# Patient Record
Sex: Male | Born: 1965 | Race: White | Hispanic: No | Marital: Married | Smoking: Never smoker
Health system: Southern US, Community
[De-identification: ages and names within clinical notes are randomized; demographics above are authoritative.]

## PROBLEM LIST (undated history)

## (undated) DIAGNOSIS — K219 Gastro-esophageal reflux disease without esophagitis: Secondary | ICD-10-CM

## (undated) DIAGNOSIS — K635 Polyp of colon: Secondary | ICD-10-CM

## (undated) DIAGNOSIS — K5792 Diverticulitis of intestine, part unspecified, without perforation or abscess without bleeding: Secondary | ICD-10-CM

## (undated) DIAGNOSIS — I10 Essential (primary) hypertension: Secondary | ICD-10-CM

## (undated) DIAGNOSIS — A6920 Lyme disease, unspecified: Secondary | ICD-10-CM

## (undated) DIAGNOSIS — R569 Unspecified convulsions: Secondary | ICD-10-CM

## (undated) DIAGNOSIS — G4730 Sleep apnea, unspecified: Secondary | ICD-10-CM

## (undated) DIAGNOSIS — R519 Headache, unspecified: Secondary | ICD-10-CM

## (undated) DIAGNOSIS — E785 Hyperlipidemia, unspecified: Secondary | ICD-10-CM

## (undated) DIAGNOSIS — M1990 Unspecified osteoarthritis, unspecified site: Secondary | ICD-10-CM

## (undated) DIAGNOSIS — T7840XA Allergy, unspecified, initial encounter: Secondary | ICD-10-CM

## (undated) DIAGNOSIS — N189 Chronic kidney disease, unspecified: Secondary | ICD-10-CM

## (undated) DIAGNOSIS — G709 Myoneural disorder, unspecified: Secondary | ICD-10-CM

## (undated) HISTORY — DX: Chronic kidney disease, unspecified: N18.9

## (undated) HISTORY — PX: NECK SURGERY: SHX720

## (undated) HISTORY — PX: PROSTATE BIOPSY: SHX241

## (undated) HISTORY — PX: SPINE SURGERY: SHX786

## (undated) HISTORY — DX: Hyperlipidemia, unspecified: E78.5

## (undated) HISTORY — DX: Polyp of colon: K63.5

## (undated) HISTORY — DX: Myoneural disorder, unspecified: G70.9

## (undated) HISTORY — DX: Diverticulitis of intestine, part unspecified, without perforation or abscess without bleeding: K57.92

## (undated) HISTORY — PX: HERNIA REPAIR: SHX51

## (undated) HISTORY — PX: SHOULDER SURGERY: SHX246

## (undated) HISTORY — DX: Essential (primary) hypertension: I10

## (undated) HISTORY — DX: Unspecified osteoarthritis, unspecified site: M19.90

## (undated) HISTORY — PX: CARDIAC CATHETERIZATION: SHX172

## (undated) HISTORY — PX: MENISCUS REPAIR: SHX5179

## (undated) HISTORY — DX: Allergy, unspecified, initial encounter: T78.40XA

---

## 2008-12-14 HISTORY — PX: NEPHRECTOMY: SHX65

## 2012-12-14 LAB — HM COLONOSCOPY

## 2017-04-14 ENCOUNTER — Encounter (INDEPENDENT_AMBULATORY_CARE_PROVIDER_SITE_OTHER): Payer: Self-pay

## 2017-04-14 ENCOUNTER — Encounter: Payer: Self-pay | Admitting: Primary Care

## 2017-04-14 ENCOUNTER — Ambulatory Visit (INDEPENDENT_AMBULATORY_CARE_PROVIDER_SITE_OTHER): Payer: Managed Care, Other (non HMO) | Admitting: Primary Care

## 2017-04-14 VITALS — BP 128/84 | HR 76 | Temp 98.0°F | Ht 69.5 in | Wt 251.5 lb

## 2017-04-14 DIAGNOSIS — E785 Hyperlipidemia, unspecified: Secondary | ICD-10-CM | POA: Diagnosis not present

## 2017-04-14 DIAGNOSIS — I10 Essential (primary) hypertension: Secondary | ICD-10-CM | POA: Diagnosis not present

## 2017-04-14 DIAGNOSIS — M2550 Pain in unspecified joint: Secondary | ICD-10-CM | POA: Diagnosis not present

## 2017-04-14 LAB — HEPATIC FUNCTION PANEL
ALBUMIN: 4.2 g/dL (ref 3.5–5.2)
ALT: 30 U/L (ref 0–53)
AST: 27 U/L (ref 0–37)
Alkaline Phosphatase: 44 U/L (ref 39–117)
Bilirubin, Direct: 0 mg/dL (ref 0.0–0.3)
Total Bilirubin: 0.4 mg/dL (ref 0.2–1.2)
Total Protein: 7.1 g/dL (ref 6.0–8.3)

## 2017-04-14 LAB — LIPID PANEL
CHOL/HDL RATIO: 7
CHOLESTEROL: 264 mg/dL — AB (ref 0–200)
HDL: 39.9 mg/dL (ref >39.00)
NonHDL: 224.22
TRIGLYCERIDES: 247 mg/dL — AB (ref 0.0–149.0)
VLDL: 49.4 mg/dL — AB (ref 0.0–40.0)

## 2017-04-14 LAB — LDL CHOLESTEROL, DIRECT: LDL DIRECT: 173 mg/dL

## 2017-04-14 MED ORDER — LISINOPRIL 10 MG PO TABS
10.0000 mg | ORAL_TABLET | Freq: Every day | ORAL | 1 refills | Status: DC
Start: 2017-04-14 — End: 2017-10-01

## 2017-04-14 NOTE — Progress Notes (Signed)
Subjective:    Patient ID: [name], male    DOB: 03-Aug-1966, 51 y.o.   MRN: [id number]  HPI  Mr. Blank is a 51 year old male who presents today to establish care and discuss the problems mentioned below. Will obtain old records. His last physical was Fall 2018.  1) Essential Hypertension: Currently managed on lisinopril 10 mg for which he's taken for the past 6 months. Previously his blood pressure was running 180/110.He denies chest pain, dizziness, shortness of breath.  2) History of Kidney Atrophy: Removal of left kidney in 2010. He is currently following with nephrology who ruled out renal cause for his hypertension 6 months ago. Recent renal function check was normal.   3) Lyme Disease: Diagnosed 6 months ago by a specialist who found positive markers for Lyme Disease.  Symptoms of joint aches, burning sensation to his skin for years. He is currently following with a specialist who treated him LDI and LDA in [place]. He stopped treatment two months ago as he moved to [place] and has noticed an increase in symptoms. He plans on finding a specialist who can provide him with these treatments locally.  4) Hyperlipidemia: Diagnosed in early January 2018. He is currently not managed on medication. He's not changed his diet or started exercising as he's been busy with moving. Previously managed on Simvastatin in 2008 which caused hives. He has labs on his phone which showed triglycerides over 500, total cholesterol at 308. He is fasting today.  Review of Systems  Constitutional: Negative for fatigue.  Eyes: Negative for visual disturbance.  Respiratory: Negative for shortness of breath.   Cardiovascular: Negative for chest pain.  Genitourinary: Negative for difficulty urinating.  Musculoskeletal: Positive for arthralgias and myalgias.  Skin: Negative for rash.  Neurological: Negative for dizziness and headaches.     Past Medical History:  Diagnosis Date  . Arthritis   .  Chronic kidney disease   . Hyperlipidemia   . Hypertension      Social History   Social History  . Marital status: Married    Spouse name: N/A  . Number of children: N/A  . Years of education: N/A   Occupational History  . Not on file.   Social History Main Topics  . Smoking status: Never Smoker  . Smokeless tobacco: Never Used  . Alcohol use Yes     Comment: occasional  . Drug use: No  . Sexual activity: Not on file   Other Topics Concern  . Not on file   Social History Narrative  . No narrative on file    Past Surgical History:  Procedure Laterality Date  . HERNIA REPAIR     x 2  . NECK SURGERY    . NEPHRECTOMY Left 2010  . SHOULDER SURGERY Left     Family History  Problem Relation Age of Onset  . Arthritis Mother   . Heart disease Mother   . Hypertension Mother   . Kidney disease Mother   . Colon cancer Father   . Lung cancer Father   . Prostate cancer Father   . Breast cancer Father   . Lung cancer Brother   . Lung cancer Paternal Aunt   . Colon cancer Paternal Uncle   . Diabetes Paternal Uncle   . Arthritis Maternal Grandmother   . Colon cancer Maternal Grandmother   . Heart disease Maternal Grandmother   . Heart disease Maternal Grandfather   . Diabetes Paternal Grandfather  Allergies  Allergen Reactions  . Nuvigil [Armodafinil] Other (See Comments)    Seizures   . Seroquel [Quetiapine] Other (See Comments)    Seizures.  . Simvastatin Hives    No current outpatient prescriptions on file prior to visit.   No current facility-administered medications on file prior to visit.     BP 128/84   Pulse 76   Temp 98 F (36.7 C) (Oral)   Ht 5' 9.5" (1.765 m)   Wt 251 lb 8 oz (114.1 kg)   BMI 36.61 kg/m    Objective:   Physical Exam  Constitutional: He is oriented to person, place, and time. He appears well-nourished.  Neck: Neck supple.  Cardiovascular: Normal rate and regular rhythm.   Pulmonary/Chest: Effort normal and breath  sounds normal. He has no wheezes. He has no rales.  Neurological: He is alert and oriented to person, place, and time.  Skin: Skin is warm and dry.  Psychiatric: He has a normal mood and affect.          Assessment & Plan:

## 2017-04-14 NOTE — Patient Instructions (Addendum)
Complete lab work prior to leaving today.   I sent refills for your lisinopril to [facility].  I'll be in touch regarding your lab results and the next step moving forward.  It was a pleasure to meet you today! Please don't hesitate to call me with any questions. Welcome to [facility]!

## 2017-04-14 NOTE — Assessment & Plan Note (Signed)
Chronic to knees, lower extremities, upper extremities. He is working to find a Quar[employer] manager to provide him with LDI and LDA treatment.

## 2017-04-14 NOTE — Assessment & Plan Note (Signed)
Evidence of hypertriglyceridemia and hypercholesterolemia from labs drawn in January 2018. We'll recheck lipids today. Consider Crestor, although he is reticent to take medications.   May require fenofibrate based off of triglyceride reading. He does have a prescription for fish oil at home but is unsure of the dose or name. Consider starting this as he never started taking as prescribed.  Strongly encouraged him to improve his diet and start exercising. Will await lab results.

## 2017-04-14 NOTE — Assessment & Plan Note (Signed)
Diagnosed and treated with lisinopril 10 mg 6 months ago. Blood pressure in the office today is stable. Will obtain records for recent BMP. Continue current regimen.

## 2017-06-18 ENCOUNTER — Encounter: Payer: Self-pay | Admitting: Family Medicine

## 2017-06-18 ENCOUNTER — Ambulatory Visit (INDEPENDENT_AMBULATORY_CARE_PROVIDER_SITE_OTHER): Payer: Managed Care, Other (non HMO) | Admitting: Family Medicine

## 2017-06-18 VITALS — BP 140/108 | HR 89 | Temp 97.6°F | Wt 259.6 lb

## 2017-06-18 DIAGNOSIS — R030 Elevated blood-pressure reading, without diagnosis of hypertension: Secondary | ICD-10-CM | POA: Diagnosis not present

## 2017-06-18 DIAGNOSIS — N3000 Acute cystitis without hematuria: Secondary | ICD-10-CM | POA: Diagnosis not present

## 2017-06-18 DIAGNOSIS — R5383 Other fatigue: Secondary | ICD-10-CM

## 2017-06-18 DIAGNOSIS — R1030 Lower abdominal pain, unspecified: Secondary | ICD-10-CM

## 2017-06-18 DIAGNOSIS — N402 Nodular prostate without lower urinary tract symptoms: Secondary | ICD-10-CM

## 2017-06-18 DIAGNOSIS — Z905 Acquired absence of kidney: Secondary | ICD-10-CM

## 2017-06-18 LAB — POCT URINALYSIS DIPSTICK
Bilirubin, UA: NEGATIVE
Glucose, UA: NEGATIVE
KETONES UA: NEGATIVE
Nitrite, UA: NEGATIVE
Protein, UA: NEGATIVE
RBC UA: NEGATIVE
SPEC GRAV UA: 1.025 (ref 1.010–1.025)
UROBILINOGEN UA: 0.2 U/dL
pH, UA: 6 (ref 5.0–8.0)

## 2017-06-18 LAB — COMPREHENSIVE METABOLIC PANEL
ALK PHOS: 45 U/L (ref 40–115)
ALT: 29 U/L (ref 9–46)
AST: 27 U/L (ref 10–35)
Albumin: 4.2 g/dL (ref 3.6–5.1)
BILIRUBIN TOTAL: 0.4 mg/dL (ref 0.2–1.2)
BUN: 10 mg/dL (ref 7–25)
CALCIUM: 9.5 mg/dL (ref 8.6–10.3)
CO2: 27 mmol/L (ref 20–31)
Chloride: 100 mmol/L (ref 98–110)
Creat: 1.01 mg/dL (ref 0.70–1.33)
GLUCOSE: 90 mg/dL (ref 65–99)
Potassium: 4.3 mmol/L (ref 3.5–5.3)
Sodium: 136 mmol/L (ref 135–146)
Total Protein: 6.9 g/dL (ref 6.1–8.1)

## 2017-06-18 LAB — CBC
HEMATOCRIT: 46.6 % (ref 38.5–50.0)
Hemoglobin: 16.2 g/dL (ref 13.2–17.1)
MCH: 31.8 pg (ref 27.0–33.0)
MCHC: 34.8 g/dL (ref 32.0–36.0)
MCV: 91.6 fL (ref 80.0–100.0)
MPV: 9.3 fL (ref 7.5–12.5)
Platelets: 232 10*3/uL (ref 140–400)
RBC: 5.09 MIL/uL (ref 4.20–5.80)
RDW: 13.5 % (ref 11.0–15.0)
WBC: 7.9 10*3/uL (ref 3.8–10.8)

## 2017-06-18 LAB — TSH: TSH: 1.45 mIU/L (ref 0.40–4.50)

## 2017-06-18 MED ORDER — CIPROFLOXACIN HCL 500 MG PO TABS: 250.0000 mg | ORAL_TABLET | Freq: Two times a day (BID) | ORAL | 0 refills | Status: DC

## 2017-06-18 NOTE — Progress Notes (Signed)
Subjective:    Patient ID: Evan [name], male    DOB: 23-Sep-1966, 51 y.o.   MRN: [id number]  HPI This is a 51 yo male who presents today with lower abdominal pain and sore/achy pain between rectum and testicles- has had longstanding, but worse lately (over last several days). Some shooting pains through his penis. Has been more fatigued lately. Never history of prostatitis. No fevers. Some loose stools, no constipation. Has seen urology in past and diagnosed with prostate nodule, according to patient, he had a negative biopsy and PSA has been normal. Appetite normal. Doesn't feel like he urinates as much as he should with as much as he drinks. Drinks about 1 gallon of water daily. Is a Freight forwarder at [employer]   Had left kidney removed 2010 due to atrophy. Had hernia repair after surgery.   Has had elevated blood pressure for several months and had nephrology evaluation.  Moved to [place] recently from [place].   Past Medical History:  Diagnosis Date  . Arthritis   . Chronic kidney disease   . Hyperlipidemia   . Hypertension    Past Surgical History:  Procedure Laterality Date  . HERNIA REPAIR     x 2  . NECK SURGERY    . NEPHRECTOMY Left 2010  . SHOULDER SURGERY Left    Family History  Problem Relation Age of Onset  . Arthritis Mother   . Heart disease Mother   . Hypertension Mother   . Kidney disease Mother   . Colon cancer Father   . Lung cancer Father   . Prostate cancer Father   . Breast cancer Father   . Lung cancer Brother   . Lung cancer Paternal Aunt   . Colon cancer Paternal Uncle   . Diabetes Paternal Uncle   . Arthritis Maternal Grandmother   . Colon cancer Maternal Grandmother   . Heart disease Maternal Grandmother   . Heart disease Maternal Grandfather   . Diabetes Paternal Grandfather    Social History  Substance Use Topics  . Smoking status: Never Smoker  . Smokeless tobacco: Never Used  . Alcohol use Yes     Comment: occasional       Review of Systems Per HPI    Objective:   Physical Exam  Constitutional: He is oriented to person, place, and time. He appears well-developed and well-nourished. No distress.  Obese.   HENT:  Head: Normocephalic and atraumatic.  Eyes: Conjunctivae are normal.  Cardiovascular: Normal rate, regular rhythm and normal heart sounds.   Pulmonary/Chest: Effort normal and breath sounds normal.  Abdominal: Soft. Bowel sounds are normal. There is tenderness (mild) in the suprapubic area. There is no rigidity, no rebound, no guarding, no tenderness at McBurney's point and negative Murphy's sign. No hernia.  Genitourinary: Prostate is not enlarged and not tender.  Neurological: He is alert and oriented to person, place, and time.  Skin: Skin is warm and dry. He is not diaphoretic.  Psychiatric: He has a normal mood and affect. His behavior is normal. Judgment and thought content normal.  Vitals reviewed.     BP (!) 140/100 (BP Location: Right Arm, Patient Position: Sitting, Cuff Size: Normal)   Pulse 89   Temp 97.6 F (36.4 C) (Oral)   Wt 259 lb 9.6 oz (117.8 kg)   SpO2 98%   BMI 37.79 kg/m  Wt Readings from Last 3 Encounters:  06/18/17 259 lb 9.6 oz (117.8 kg)  04/14/17 251 lb 8 oz (114.1 kg)  BP Readings from Last 3 Encounters:  06/18/17 (!) 140/100  04/14/17 128/84   Results for orders placed or performed in visit on 06/18/17  CBC  Result Value Ref Range   WBC 7.9 3.8 - 10.8 K/uL   RBC 5.09 4.20 - 5.80 MIL/uL   Hemoglobin 16.2 13.2 - 17.1 g/dL   HCT 46.6 38.5 - 50.0 %   MCV 91.6 80.0 - 100.0 fL   MCH 31.8 27.0 - 33.0 pg   MCHC 34.8 32.0 - 36.0 g/dL   RDW 13.5 11.0 - 15.0 %   Platelets 232 140 - 400 K/uL   MPV 9.3 7.5 - 12.5 fL  Comprehensive metabolic panel  Result Value Ref Range   Sodium 136 135 - 146 mmol/L   Potassium 4.3 3.5 - 5.3 mmol/L   Chloride 100 98 - 110 mmol/L   CO2 27 20 - 31 mmol/L   Glucose, Bld 90 65 - 99 mg/dL   BUN 10 7 - 25 mg/dL    Creat 1.01 0.70 - 1.33 mg/dL   Total Bilirubin 0.4 0.2 - 1.2 mg/dL   Alkaline Phosphatase 45 40 - 115 U/L   AST 27 10 - 35 U/L   ALT 29 9 - 46 U/L   Total Protein 6.9 6.1 - 8.1 g/dL   Albumin 4.2 3.6 - 5.1 g/dL   Calcium 9.5 8.6 - 10.3 mg/dL  TSH  Result Value Ref Range   TSH 1.45 0.40 - 4.50 mIU/L  PSA  Result Value Ref Range   PSA 0.3 <=4.0 ng/mL  Hemoglobin A1c  Result Value Ref Range   Hgb A1c MFr Bld 5.6 <5.7 %   Mean Plasma Glucose 114 mg/dL  POCT urinalysis dipstick  Result Value Ref Range   Color, UA Yellow    Clarity, UA Clear    Glucose, UA Negative    Bilirubin, UA Negative    Ketones, UA Negative    Spec Grav, UA 1.025 1.010 - 1.025   Blood, UA Negative    pH, UA 6.0 5.0 - 8.0   Protein, UA Negative    Urobilinogen, UA 0.2 0.2 or 1.0 E.U./dL   Nitrite, UA Negative    Leukocytes, UA Small (1+) (A) Negative       Assessment & Plan:  1. Lower abdominal pain - POCT urinalysis dipstick - CBC - Comprehensive metabolic panel  2. Acute cystitis without hematuria - will treat with flouroquinolone to cover early prostatitis. Discussed potential side effects of cipro and to stop medication and notify office if these occur.  - Urine Culture - ciprofloxacin (CIPRO) 500 MG tablet; Take 0.5 tablets (250 mg total) by mouth 2 (two) times daily.  Dispense: 10 tablet; Refill: 0 - Hemoglobin A1c  3. Other fatigue - could be related to heat and recent weight gain - TSH - Hemoglobin A1c  4. Prostate nodule - reports history and previous biopsy by urology, will check PSA today given symptoms.  - PSA  5. Elevated blood pressure - he will monitor at home and notify PCP is remains elevated  Clarene Reamer, FNP-BC  [facility]  06/19/2017 12:00 PM

## 2017-06-18 NOTE — Patient Instructions (Addendum)
I will notify you of lab results Take antibiotic until finished  Urinary Tract Infection, Adult A urinary tract infection (UTI) is an infection of any part of the urinary tract, which includes the kidneys, ureters, bladder, and urethra. These organs make, store, and get rid of urine in the body. UTI can be a bladder infection (cystitis) or kidney infection (pyelonephritis). What are the causes? This infection may be caused by fungi, viruses, or bacteria. Bacteria are the most common cause of UTIs. This condition can also be caused by repeated incomplete emptying of the bladder during urination. What increases the risk? This condition is more likely to develop if:  You ignore your need to urinate or hold urine for long periods of time.  You do not empty your bladder completely during urination.  You wipe back to front after urinating or having a bowel movement, if you are male.  You are uncircumcised, if you are male.  You are constipated.  You have a urinary catheter that stays in place (indwelling).  You have a weak defense (immune) system.  You have a medical condition that affects your bowels, kidneys, or bladder.  You have diabetes.  You take antibiotic medicines frequently or for long periods of time, and the antibiotics no longer work well against certain types of infections (antibiotic resistance).  You take medicines that irritate your urinary tract.  You are exposed to chemicals that irritate your urinary tract.  You are male.  What are the signs or symptoms? Symptoms of this condition include:  Fever.  Frequent urination or passing small amounts of urine frequently.  Needing to urinate urgently.  Pain or burning with urination.  Urine that smells bad or unusual.  Cloudy urine.  Pain in the lower abdomen or back.  Trouble urinating.  Blood in the urine.  Vomiting or being less hungry than normal.  Diarrhea or abdominal pain.  Vaginal  discharge, if you are male.  How is this diagnosed? This condition is diagnosed with a medical history and physical exam. You will also need to provide a urine sample to test your urine. Other tests may be done, including:  Blood tests.  Sexually transmitted disease (STD) testing.  If you have had more than one UTI, a cystoscopy or imaging studies may be done to determine the cause of the infections. How is this treated? Treatment for this condition often includes a combination of two or more of the following:  Antibiotic medicine.  Other medicines to treat less common causes of UTI.  Over-the-counter medicines to treat pain.  Drinking enough water to stay hydrated.  Follow these instructions at home:  Take over-the-counter and prescription medicines only as told by your health care provider.  If you were prescribed an antibiotic, take it as told by your health care provider. Do not stop taking the antibiotic even if you start to feel better.  Avoid alcohol, caffeine, tea, and carbonated beverages. They can irritate your bladder.  Drink enough fluid to keep your urine clear or pale yellow.  Keep all follow-up visits as told by your health care provider. This is important.  Make sure to: ? Empty your bladder often and completely. Do not hold urine for long periods of time. ? Empty your bladder before and after sex. ? Wipe from front to back after a bowel movement if you are male. Use each tissue one time when you wipe. Contact a health care provider if:  You have back pain.  You have a  fever.  You feel nauseous or vomit.  Your symptoms do not get better after 3 days.  Your symptoms go away and then return. Get help right away if:  You have severe back pain or lower abdominal pain.  You are vomiting and cannot keep down any medicines or water. This information is not intended to replace advice given to you by your health care provider. Make sure you discuss any  questions you have with your health care provider. Document Released: 09/09/2005 Document Revised: 05/13/2016 Document Reviewed: 10/21/2015 Elsevier Interactive Patient Education  2017 [employer].

## 2017-06-19 DIAGNOSIS — Z905 Acquired absence of kidney: Secondary | ICD-10-CM | POA: Insufficient documentation

## 2017-06-19 LAB — HEMOGLOBIN A1C
Hgb A1c MFr Bld: 5.6 % (ref <5.7)
MEAN PLASMA GLUCOSE: 114 mg/dL

## 2017-06-19 LAB — PSA: PSA: 0.3 ng/mL (ref <=4.0)

## 2017-06-19 LAB — URINE CULTURE: ORGANISM ID, BACTERIA: NO GROWTH

## 2017-10-01 ENCOUNTER — Ambulatory Visit: Payer: Managed Care, Other (non HMO) | Admitting: Family Medicine

## 2017-10-01 ENCOUNTER — Encounter: Payer: Self-pay | Admitting: Physician Assistant

## 2017-10-01 ENCOUNTER — Ambulatory Visit (INDEPENDENT_AMBULATORY_CARE_PROVIDER_SITE_OTHER): Payer: Managed Care, Other (non HMO) | Admitting: Physician Assistant

## 2017-10-01 VITALS — BP 134/92 | HR 92 | Temp 98.7°F | Wt 258.2 lb

## 2017-10-01 DIAGNOSIS — I10 Essential (primary) hypertension: Secondary | ICD-10-CM | POA: Diagnosis not present

## 2017-10-01 DIAGNOSIS — J069 Acute upper respiratory infection, unspecified: Secondary | ICD-10-CM | POA: Diagnosis not present

## 2017-10-01 MED ORDER — LISINOPRIL 10 MG PO TABS: 10.0000 mg | ORAL_TABLET | Freq: Every day | ORAL | 0 refills | Status: DC

## 2017-10-01 MED ORDER — DOXYCYCLINE HYCLATE 100 MG PO TABS: 100.0000 mg | ORAL_TABLET | Freq: Two times a day (BID) | ORAL | 0 refills | Status: DC

## 2017-10-01 NOTE — Patient Instructions (Signed)
It was great to meet you.  I have refilled your blood pressure medication x 1 month, for further refills please follow-up with Allie Bossier at [facility].  Start doxycycline antibiotic for your upper respiratory infection. Keep [place] posted if your symptoms worsen.

## 2017-10-01 NOTE — Progress Notes (Signed)
Evan [name] is a 51 y.o. male here for a new problem.   History of Present Illness:   Chief Complaint  Patient presents with  . Nasal Congestion  . congestion in chest  . Cough    HPI  URI Patient presents with 1 week hx of cough. Wife with similar symptoms, however she is now well. He does have a hx of bronchitis, and PNA (most recently in 1990s). Has tried Vitamin C, Airborne, Nyquil. No fever, SOB. Ear pain and sore throat. He does not get the flu shot as he does not believe in them.  Cough is productive with brown sputum. Denies chest pain or SOB.  Hypertension Currently on Lisinopril 10 mg, he is going out of town and is overdue for his PCP visit to re-order his BP medication.  Does not check BP at home. Patient denies chest pain, SOB, blurred vision, dizziness, unusual headaches, lower leg swelling. Patient is compliant with medication. Denies excessive caffeine intake, stimulant usage, excessive alcohol intake, or increase in salt consumption.   *I discussed with him that I am only willing to give a 30 day refill.   Past Medical History:  Diagnosis Date  . Arthritis   . Chronic kidney disease   . Hyperlipidemia   . Hypertension      Social History   Social History  . Marital status: Married    Spouse name: N/A  . Number of children: N/A  . Years of education: N/A   Occupational History  . Not on file.   Social History Main Topics  . Smoking status: Never Smoker  . Smokeless tobacco: Never Used  . Alcohol use Yes     Comment: occasional  . Drug use: No  . Sexual activity: Not on file   Other Topics Concern  . Not on file   Social History Narrative  . No narrative on file    Past Surgical History:  Procedure Laterality Date  . HERNIA REPAIR     x 2  . NECK SURGERY    . NEPHRECTOMY Left 2010  . SHOULDER SURGERY Left     Family History  Problem Relation Age of Onset  . Arthritis Mother   . Heart disease Mother   . Hypertension Mother   .  Kidney disease Mother   . Colon cancer Father   . Lung cancer Father   . Prostate cancer Father   . Breast cancer Father   . Lung cancer Brother   . Lung cancer Paternal Aunt   . Colon cancer Paternal Uncle   . Diabetes Paternal Uncle   . Arthritis Maternal Grandmother   . Colon cancer Maternal Grandmother   . Heart disease Maternal Grandmother   . Heart disease Maternal Grandfather   . Diabetes Paternal Grandfather     Allergies  Allergen Reactions  . Nuvigil [Armodafinil] Other (See Comments)    Seizures   . Seroquel [Quetiapine] Other (See Comments)    Seizures.  . Simvastatin Hives    Current Medications:   Current Outpatient Prescriptions:  .  aspirin-acetaminophen-caffeine (EXCEDRIN MIGRAINE) 250-250-65 MG tablet, Take by mouth every 6 (six) hours as needed for headache., Disp: , Rfl:  .  Doxylamine Succinate, Sleep, (SLEEP AID PO), Take by mouth as needed., Disp: , Rfl:  .  lisinopril (PRINIVIL,ZESTRIL) 10 MG tablet, Take 1 tablet (10 mg total) by mouth daily., Disp: 30 tablet, Rfl: 0 .  meclizine (ANTIVERT) 25 MG tablet, Take 25 mg by mouth daily., Disp: ,  Rfl:  .  doxycycline (VIBRA-TABS) 100 MG tablet, Take 1 tablet (100 mg total) by mouth 2 (two) times daily., Disp: 20 tablet, Rfl: 0   Review of Systems:   ROS  Negative unless otherwise specified by HPI  Vitals:   Vitals:   10/01/17 1156  BP: (!) 134/92  Pulse: 92  Temp: 98.7 F (37.1 C)  TempSrc: Oral  SpO2: 97%  Weight: 258 lb 4 oz (117.1 kg)     Body mass index is 37.59 kg/m.  Physical Exam:   Physical Exam  Constitutional: He appears well-developed. He is cooperative.  Non-toxic appearance. He does not have a sickly appearance. He does not appear ill. No distress.  HENT:  Head: Normocephalic and atraumatic.  Right Ear: Tympanic membrane, external ear and ear canal normal. Tympanic membrane is not erythematous, not retracted and not bulging.  Left Ear: Tympanic membrane, external ear and ear  canal normal. Tympanic membrane is not erythematous, not retracted and not bulging.  Nose: Rhinorrhea and nose lacerations present. Right sinus exhibits maxillary sinus tenderness. Right sinus exhibits no frontal sinus tenderness. Left sinus exhibits maxillary sinus tenderness. Left sinus exhibits no frontal sinus tenderness.  Mouth/Throat: Uvula is midline. No posterior oropharyngeal edema or posterior oropharyngeal erythema. Tonsils are 1+ on the right. Tonsils are 1+ on the left. No tonsillar exudate.  Eyes: Conjunctivae and lids are normal.  Neck: Trachea normal.  Cardiovascular: Normal rate, regular rhythm, S1 normal, S2 normal and normal heart sounds.   Pulmonary/Chest: Effort normal. He has decreased breath sounds in the right lower field and the left lower field. He has no wheezes. He has no rhonchi. He has no rales.  Lymphadenopathy:    He has no cervical adenopathy.  Neurological: He is alert.  Skin: Skin is warm, dry and intact.  Psychiatric: He has a normal mood and affect. His speech is normal and behavior is normal.  Nursing note and vitals reviewed.   Assessment and Plan:    Tahsin was seen today for nasal congestion, congestion in chest and cough.  Diagnoses and all orders for this visit:  Upper respiratory tract infection, unspecified type Treat with doxycycline per orders -- I suspect early dx of bronchitis. Discussed pushing fluids, increase rest as able. Patient declines medication for cough. Follow-up if symptoms worsen or persist.  Essential hypertension I discussed with patient that he needs an office visit for any further refills, however I will give a 1-time courtesy 30-day refill as he is about to go out of town. Patient verbalized understanding. -     lisinopril (PRINIVIL,ZESTRIL) 10 MG tablet; Take 1 tablet (10 mg total) by mouth daily.  Other orders -     doxycycline (VIBRA-TABS) 100 MG tablet; Take 1 tablet (100 mg total) by mouth 2 (two) times  daily.    . Reviewed expectations re: course of current medical issues. . Discussed self-management of symptoms. . Outlined signs and symptoms indicating need for more acute intervention. . Patient verbalized understanding and all questions were answered. . See orders for this visit as documented in the electronic medical record. . Patient received an After-Visit Summary.   Inda Coke, PA-C

## 2017-11-10 ENCOUNTER — Ambulatory Visit: Payer: Managed Care, Other (non HMO) | Admitting: Primary Care

## 2017-11-10 ENCOUNTER — Encounter: Payer: Self-pay | Admitting: Primary Care

## 2017-11-10 ENCOUNTER — Other Ambulatory Visit: Payer: Self-pay

## 2017-11-10 VITALS — BP 140/82 | HR 101 | Temp 98.8°F | Wt 262.4 lb

## 2017-11-10 DIAGNOSIS — I10 Essential (primary) hypertension: Secondary | ICD-10-CM

## 2017-11-10 DIAGNOSIS — R5383 Other fatigue: Secondary | ICD-10-CM

## 2017-11-10 LAB — VITAMIN B12: Vitamin B-12: 261 pg/mL (ref 211–911)

## 2017-11-10 LAB — CBC
HCT: 47.6 % (ref 39.0–52.0)
Hemoglobin: 16.8 g/dL (ref 13.0–17.0)
MCHC: 35.3 g/dL (ref 30.0–36.0)
MCV: 92.1 fl (ref 78.0–100.0)
Platelets: 257 10*3/uL (ref 150.0–400.0)
RBC: 5.17 Mil/uL (ref 4.22–5.81)
RDW: 13.2 % (ref 11.5–15.5)
WBC: 9.1 10*3/uL (ref 4.0–10.5)

## 2017-11-10 LAB — BASIC METABOLIC PANEL
BUN: 16 mg/dL (ref 6–23)
CALCIUM: 9.7 mg/dL (ref 8.4–10.5)
CO2: 30 mEq/L (ref 19–32)
CREATININE: 1.07 mg/dL (ref 0.40–1.50)
Chloride: 100 mEq/L (ref 96–112)
GFR: 76.73 mL/min (ref >60.00)
Glucose, Bld: 115 mg/dL — ABNORMAL HIGH (ref 70–99)
Potassium: 4.1 mEq/L (ref 3.5–5.1)
Sodium: 137 mEq/L (ref 135–145)

## 2017-11-10 LAB — TSH: TSH: 1.5 u[IU]/mL (ref 0.35–4.50)

## 2017-11-10 LAB — VITAMIN D 25 HYDROXY (VIT D DEFICIENCY, FRACTURES): VITD: 15.71 ng/mL — AB (ref 30.00–100.00)

## 2017-11-10 MED ORDER — LISINOPRIL 10 MG PO TABS: 10.0000 mg | ORAL_TABLET | Freq: Every day | ORAL | 0 refills | Status: DC

## 2017-11-10 MED ORDER — LISINOPRIL 10 MG PO TABS: 20.0000 mg | ORAL_TABLET | Freq: Every day | ORAL | 0 refills | Status: DC

## 2017-11-10 NOTE — Progress Notes (Signed)
Subjective:    Patient ID: [name], male    DOB: 01-Feb-1966, 51 y.o.   MRN: [id number]  HPI  Mr. Mcdaniel is a 51 year old male who presents today with multiple complaints.  1) Fatigue: Symptoms of low energy, intermittent parietal lobe headache. This has been present for the past several months.   2) Essential Hypertension: Currently managed on lisinopril 10 mg. Is experiencing intermittent parietal headaches for 6 months. He endorses a poor diet and is not exercising.   Wt Readings from Last 3 Encounters:  11/10/17 262 lb 6.4 oz (119 kg)  10/01/17 258 lb 4 oz (117.1 kg)  06/18/17 259 lb 9.6 oz (117.8 kg)     He checks his BP at home and is getting readings of 140's/80's-100's. When he experiences his headaches his BP runs 170's/100's.   BP Readings from Last 3 Encounters:  11/10/17 140/82  10/01/17 (!) 134/92  06/18/17 (!) 140/108   3) Abdominal Cramping: Description of muscle spasm type cramps that have been intermittent. He has noticed that he'll experience a bowel movement after each meal/snack which is a change.  Previously bowel movements were once to twice daily.  No diarrhea, bloody stools, nausea, vomiting. Colonoscopy completed 3 years ago, showed hemorrhoids, polyps removed.    Review of Systems  Constitutional: Positive for fatigue.  Eyes: Negative for visual disturbance.  Respiratory: Negative for shortness of breath.   Cardiovascular: Negative for chest pain.  Gastrointestinal: Negative for constipation and diarrhea.       Intermittent abdominal cramping  Neurological: Positive for headaches.       Past Medical History:  Diagnosis Date  . Arthritis   . Chronic kidney disease   . Hyperlipidemia   . Hypertension      Social History   Socioeconomic History  . Marital status: Married    Spouse name: Not on file  . Number of children: Not on file  . Years of education: Not on file  . Highest education level: Not on file  Social Needs  .  Financial resource strain: Not on file  . Food insecurity - worry: Not on file  . Food insecurity - inability: Not on file  . Transportation needs - medical: Not on file  . Transportation needs - non-medical: Not on file  Occupational History  . Not on file  Tobacco Use  . Smoking status: Never Smoker  . Smokeless tobacco: Never Used  Substance and Sexual Activity  . Alcohol use: Yes    Comment: occasional  . Drug use: No  . Sexual activity: Not on file  Other Topics Concern  . Not on file  Social History Narrative  . Not on file      Family History  Problem Relation Age of Onset  . Arthritis Mother   . Heart disease Mother   . Hypertension Mother   . Kidney disease Mother   . Colon cancer Father   . Lung cancer Father   . Prostate cancer Father   . Breast cancer Father   . Lung cancer Brother   . Lung cancer Paternal Aunt   . Colon cancer Paternal Uncle   . Diabetes Paternal Uncle   . Arthritis Maternal Grandmother   . Colon cancer Maternal Grandmother   . Heart disease Maternal Grandmother   . Heart disease Maternal Grandfather   . Diabetes Paternal Grandfather     Allergies  Allergen Reactions  . Nuvigil [Armodafinil] Other (See Comments)    Seizures   .  Seroquel [Quetiapine] Other (See Comments)    Seizures.  . Simvastatin Hives    Current Outpatient Medications on File Prior to Visit  Medication Sig Dispense Refill  . aspirin-acetaminophen-caffeine (EXCEDRIN MIGRAINE) 250-250-65 MG tablet Take by mouth every 6 (six) hours as needed for headache.    . Doxylamine Succinate, Sleep, (SLEEP AID PO) Take by mouth as needed.    . meclizine (ANTIVERT) 25 MG tablet Take 25 mg by mouth daily.     No current facility-administered medications on file prior to visit.     BP 140/82   Pulse (!) 101   Temp 98.8 F (37.1 C) (Oral)   Wt 262 lb 6.4 oz (119 kg)   SpO2 94%   BMI 38.19 kg/m    Objective:   Physical Exam  Constitutional: He is oriented to  person, place, and time. He appears well-nourished.  Neck: Neck supple.  Cardiovascular: Normal rate and regular rhythm.  Pulmonary/Chest: Effort normal and breath sounds normal. He has no wheezes. He has no rales.  Abdominal: Soft. Bowel sounds are normal. There is no tenderness.  Neurological: He is alert and oriented to person, place, and time.  Skin: Skin is warm and dry.          Assessment & Plan:  Abdominal cramping:  Also with more frequent bowel movements without diarrhea. Could be secondary to poor diet and lack of exercise. Abdominal exam today unremarkable. Check CBC and CMP. Continue to monitor.  Sheral Flow, NP

## 2017-11-10 NOTE — Assessment & Plan Note (Addendum)
Blood pressure above goal on several encounters.  Likely secondary to weight gain from poor diet and lack of exercise.  Suspect fatigue and headaches caused by hypertension but will rule out secondary causes given history of nephrectomy.  Check BMP, TSH, CBC vitamin B12, vitamin D. Increase lisinopril to 20 mg.  He just picked up a refill for the 10 mg tablets and will take to 10 mg tablets. Follow-up in 2-3 weeks with blood pressure logs.

## 2017-11-10 NOTE — Patient Instructions (Signed)
Complete lab work prior to leaving today. I will notify you of your results once received.   We've increased your lisinopril from 10 mg to 20 mg. Start taking two of the 10 mg tablets once daily.  Check your blood pressure daily, around the same time of day, for the next 2-3 weeks.  Ensure that you have rested for 30 minutes prior to checking your blood pressure. Record your readings and bring them to your next visit.  Start exercising. You should be getting 150 minutes of moderate intensity exercise weekly.  It's important to improve your diet by reducing consumption of fast food, fried food, processed snack foods, sugary drinks. Increase consumption of fresh vegetables and fruits, whole grains, water.  Ensure you are drinking 64 ounces of water daily.  Schedule a follow up visit in 2-3 weeks for blood pressure check.  It was a pleasure to see you today!   DASH Eating Plan DASH stands for "Dietary Approaches to Stop Hypertension." The DASH eating plan is a healthy eating plan that has been shown to reduce high blood pressure (hypertension). It may also reduce your risk for type 2 diabetes, heart disease, and stroke. The DASH eating plan may also help with weight loss. What are tips for following this plan? General guidelines  Avoid eating more than 2,300 mg (milligrams) of salt (sodium) a day. If you have hypertension, you may need to reduce your sodium intake to 1,500 mg a day.  Limit alcohol intake to no more than 1 drink a day for nonpregnant women and 2 drinks a day for men. One drink equals 12 oz of beer, 5 oz of wine, or 1 oz of hard liquor.  Work with your health care provider to maintain a healthy body weight or to lose weight. Ask what an ideal weight is for you.  Get at least 30 minutes of exercise that causes your heart to beat faster (aerobic exercise) most days of the week. Activities may include walking, swimming, or biking.  Work with your health care provider or diet  and nutrition specialist (dietitian) to adjust your eating plan to your individual calorie needs. Reading food labels  Check food labels for the amount of sodium per serving. Choose foods with less than 5 percent of the Daily Value of sodium. Generally, foods with less than 300 mg of sodium per serving fit into this eating plan.  To find whole grains, look for the word "whole" as the first word in the ingredient list. Shopping  Buy products labeled as "low-sodium" or "no salt added."  Buy fresh foods. Avoid canned foods and premade or frozen meals. Cooking  Avoid adding salt when cooking. Use salt-free seasonings or herbs instead of table salt or sea salt. Check with your health care provider or pharmacist before using salt substitutes.  Do not fry foods. Cook foods using healthy methods such as baking, boiling, grilling, and broiling instead.  Cook with heart-healthy oils, such as olive, canola, soybean, or sunflower oil. Meal planning   Eat a balanced diet that includes: ? 5 or more servings of fruits and vegetables each day. At each meal, try to fill half of your plate with fruits and vegetables. ? Up to 6-8 servings of whole grains each day. ? Less than 6 oz of lean meat, poultry, or fish each day. A 3-oz serving of meat is about the same size as a deck of cards. One egg equals 1 oz. ? 2 servings of low-fat dairy each day. ?  A serving of nuts, seeds, or beans 5 times each week. ? Heart-healthy fats. Healthy fats called Omega-3 fatty acids are found in foods such as flaxseeds and coldwater fish, like sardines, salmon, and mackerel.  Limit how much you eat of the following: ? Canned or prepackaged foods. ? Food that is high in trans fat, such as fried foods. ? Food that is high in saturated fat, such as fatty meat. ? Sweets, desserts, sugary drinks, and other foods with added sugar. ? Full-fat dairy products.  Do not salt foods before eating.  Try to eat at least 2 vegetarian  meals each week.  Eat more home-cooked food and less restaurant, buffet, and fast food.  When eating at a restaurant, ask that your food be prepared with less salt or no salt, if possible. What foods are recommended? The items listed may not be a complete list. Talk with your dietitian about what dietary choices are best for you. Grains Whole-grain or whole-wheat bread. Whole-grain or whole-wheat pasta. Brown rice. [place]. Bulgur. Whole-grain and low-sodium cereals. Pita bread. Low-fat, low-sodium crackers. Whole-wheat flour tortillas. Vegetables Fresh or frozen vegetables (raw, steamed, roasted, or grilled). Low-sodium or reduced-sodium tomato and vegetable juice. Low-sodium or reduced-sodium tomato sauce and tomato paste. Low-sodium or reduced-sodium canned vegetables. Fruits All fresh, dried, or frozen fruit. Canned fruit in natural juice (without added sugar). Meat and other protein foods Skinless chicken or [place]. Ground chicken or [place]. Pork with fat trimmed off. Fish and seafood. Egg whites. Dried beans, peas, or lentils. Unsalted nuts, nut butters, and seeds. Unsalted canned beans. Lean cuts of beef with fat trimmed off. Low-sodium, lean deli meat. Dairy Low-fat (1%) or fat-free (skim) milk. Fat-free, low-fat, or reduced-fat cheeses. Nonfat, low-sodium ricotta or cottage cheese. Low-fat or nonfat yogurt. Low-fat, low-sodium cheese. Fats and oils Soft margarine without trans fats. Vegetable oil. Low-fat, reduced-fat, or light mayonnaise and salad dressings (reduced-sodium). Canola, safflower, olive, soybean, and sunflower oils. Avocado. Seasoning and other foods Herbs. Spices. Seasoning mixes without salt. Unsalted popcorn and pretzels. Fat-free sweets. What foods are not recommended? The items listed may not be a complete list. Talk with your dietitian about what dietary choices are best for you. Grains Baked goods made with fat, such as croissants, muffins, or some  breads. Dry pasta or rice meal packs. Vegetables Creamed or fried vegetables. Vegetables in a cheese sauce. Regular canned vegetables (not low-sodium or reduced-sodium). Regular canned tomato sauce and paste (not low-sodium or reduced-sodium). Regular tomato and vegetable juice (not low-sodium or reduced-sodium). Angie Fava. Olives. Fruits Canned fruit in a light or heavy syrup. Fried fruit. Fruit in cream or butter sauce. Meat and other protein foods Fatty cuts of meat. Ribs. Fried meat. Berniece Salines. Sausage. Bologna and other processed lunch meats. Salami. Fatback. Hotdogs. Bratwurst. Salted nuts and seeds. Canned beans with added salt. Canned or smoked fish. Whole eggs or egg yolks. Chicken or [place] with skin. Dairy Whole or 2% milk, cream, and half-and-half. Whole or full-fat cream cheese. Whole-fat or sweetened yogurt. Full-fat cheese. Nondairy creamers. Whipped toppings. Processed cheese and cheese spreads. Fats and oils Butter. Stick margarine. Lard. Shortening. Ghee. Bacon fat. Tropical oils, such as coconut, palm kernel, or palm oil. Seasoning and other foods Salted popcorn and pretzels. Onion salt, garlic salt, seasoned salt, table salt, and sea salt. Worcestershire sauce. Tartar sauce. Barbecue sauce. Teriyaki sauce. Soy sauce, including reduced-sodium. Steak sauce. Canned and packaged gravies. Fish sauce. Oyster sauce. Cocktail sauce. Horseradish that you find on the shelf. Ketchup.  Mustard. Meat flavorings and tenderizers. Bouillon cubes. Hot sauce and Tabasco sauce. Premade or packaged marinades. Premade or packaged taco seasonings. Relishes. Regular salad dressings. Where to find more information:  National Heart, Lung, and [place]: https://wilson-eaton.com/  American Heart Association: www.heart.org Summary  The DASH eating plan is a healthy eating plan that has been shown to reduce high blood pressure (hypertension). It may also reduce your risk for type 2 diabetes, heart disease, and  stroke.  With the DASH eating plan, you should limit salt (sodium) intake to 2,300 mg a day. If you have hypertension, you may need to reduce your sodium intake to 1,500 mg a day.  When on the DASH eating plan, aim to eat more fresh fruits and vegetables, whole grains, lean proteins, low-fat dairy, and heart-healthy fats.  Work with your health care provider or diet and nutrition specialist (dietitian) to adjust your eating plan to your individual calorie needs. This information is not intended to replace advice given to you by your health care provider. Make sure you discuss any questions you have with your health care provider. Document Released: 11/19/2011 Document Revised: 11/23/2016 Document Reviewed: 11/23/2016 Elsevier Interactive Patient Education  2017 [employer].

## 2017-11-15 ENCOUNTER — Ambulatory Visit: Payer: Self-pay | Admitting: *Deleted

## 2017-11-15 ENCOUNTER — Emergency Department (HOSPITAL_COMMUNITY)
Admission: EM | Admit: 2017-11-15 | Discharge: 2017-11-15 | Disposition: A | Discharge status: Home / Self Care | Payer: Managed Care, Other (non HMO) | Attending: Emergency Medicine | Admitting: Emergency Medicine

## 2017-11-15 ENCOUNTER — Emergency Department (HOSPITAL_COMMUNITY): Payer: Managed Care, Other (non HMO)

## 2017-11-15 ENCOUNTER — Other Ambulatory Visit: Payer: Self-pay

## 2017-11-15 ENCOUNTER — Encounter (HOSPITAL_COMMUNITY): Payer: Self-pay | Admitting: *Deleted

## 2017-11-15 DIAGNOSIS — I129 Hypertensive chronic kidney disease with stage 1 through stage 4 chronic kidney disease, or unspecified chronic kidney disease: Secondary | ICD-10-CM | POA: Diagnosis not present

## 2017-11-15 DIAGNOSIS — N189 Chronic kidney disease, unspecified: Secondary | ICD-10-CM | POA: Diagnosis not present

## 2017-11-15 DIAGNOSIS — R42 Dizziness and giddiness: Secondary | ICD-10-CM | POA: Insufficient documentation

## 2017-11-15 DIAGNOSIS — E785 Hyperlipidemia, unspecified: Secondary | ICD-10-CM | POA: Insufficient documentation

## 2017-11-15 DIAGNOSIS — R519 Headache, unspecified: Secondary | ICD-10-CM

## 2017-11-15 DIAGNOSIS — R51 Headache: Secondary | ICD-10-CM | POA: Diagnosis not present

## 2017-11-15 HISTORY — DX: Lyme disease, unspecified: A69.20

## 2017-11-15 LAB — CBC WITH DIFFERENTIAL/PLATELET
BASOS PCT: 1 %
Basophils Absolute: 0.1 10*3/uL (ref 0.0–0.1)
EOS PCT: 5 %
Eosinophils Absolute: 0.5 10*3/uL (ref 0.0–0.7)
HCT: 47.2 % (ref 39.0–52.0)
Hemoglobin: 17.1 g/dL — ABNORMAL HIGH (ref 13.0–17.0)
Lymphocytes Relative: 34 %
Lymphs Abs: 3.4 10*3/uL (ref 0.7–4.0)
MCH: 32.8 pg (ref 26.0–34.0)
MCHC: 36.2 g/dL — ABNORMAL HIGH (ref 30.0–36.0)
MCV: 90.4 fL (ref 78.0–100.0)
Monocytes Absolute: 0.9 10*3/uL (ref 0.1–1.0)
Monocytes Relative: 9 %
Neutro Abs: 5 10*3/uL (ref 1.7–7.7)
Neutrophils Relative %: 51 %
PLATELETS: 221 10*3/uL (ref 150–400)
RBC: 5.22 MIL/uL (ref 4.22–5.81)
RDW: 13.5 % (ref 11.5–15.5)
WBC: 9.8 10*3/uL (ref 4.0–10.5)

## 2017-11-15 LAB — I-STAT CHEM 8, ED
BUN: 12 mg/dL (ref 6–20)
Calcium, Ion: 1.2 mmol/L (ref 1.15–1.40)
Chloride: 101 mmol/L (ref 101–111)
Creatinine, Ser: 1 mg/dL (ref 0.61–1.24)
Glucose, Bld: 109 mg/dL — ABNORMAL HIGH (ref 65–99)
HEMATOCRIT: 49 % (ref 39.0–52.0)
HEMOGLOBIN: 16.7 g/dL (ref 13.0–17.0)
Potassium: 3.6 mmol/L (ref 3.5–5.1)
Sodium: 139 mmol/L (ref 135–145)
TCO2: 26 mmol/L (ref 22–32)

## 2017-11-15 NOTE — Discharge Instructions (Signed)
Follow up with your primary care doctor, return as needed for worsening symptoms °

## 2017-11-15 NOTE — ED Notes (Signed)
Patient transported to CT 

## 2017-11-15 NOTE — ED Triage Notes (Signed)
Pt states recent doubling of blood pressure meds.  Today at 0930 he began having a headache and nausea accompanied by dizziness.  He states similar symptoms with hypertension.

## 2017-11-15 NOTE — Telephone Encounter (Signed)
Patient stated that he went to ED as instructed and been waiting for 5 hours to be seen. He is going wait.

## 2017-11-15 NOTE — Telephone Encounter (Signed)
Noted  

## 2017-11-15 NOTE — Telephone Encounter (Signed)
Pt c/o nausea, headache, dizziness, and sweaty. He states his b/p meds was changed recently. He can not check his b/p right now because he is at work. Pt advised to go to the ED to be assessed.  He offered to go to [facility] because he was close to it so they could check his b/p.  I notified the office to see if any availability to see some regarding his symptoms and they could not see him. Again advised him to go the ED to be assessed. He stated that he would go.  Reason for Disposition . Patient sounds very sick or weak to the triager  Protocols used: HEADACHE-A-AH

## 2017-11-15 NOTE — ED Provider Notes (Signed)
[facility] EMERGENCY DEPARTMENT Provider Note   CSN: [id number] Arrival date & time: 11/15/17  1253     History   Chief Complaint Chief Complaint  Patient presents with  . Headache  . Dizziness    HPI Evan [name] is a 51 y.o. male.  HPI Pt has a history of HTN.  He had been taking 10 mg but increased to 20 mg about a week ago.  Today he developed a headache.  He started to feel sweaty and clammy.  Patient states the headache is at the top of his head.  It was severe.  He denied any focal numbness or weakness.  No trouble with his speech.  No fevers or chills.  No chest pain or shortness of breath. Past Medical History:  Diagnosis Date  . Arthritis   . Chronic kidney disease   . Hyperlipidemia   . Hypertension   . Lyme disease     Patient Active Problem List   Diagnosis Date Noted  . 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 Essential hypertension 04/14/2017  . Hyperlipidemia 04/14/2017  . Arthralgia 04/14/2017    Past Surgical History:  Procedure Laterality Date  . HERNIA REPAIR     x 2  . NECK SURGERY    . NEPHRECTOMY Left 2010  . SHOULDER SURGERY Left        Home Medications    Prior to Admission medications   Medication Sig Start Date End Date Taking? Authorizing Provider  aspirin-acetaminophen-caffeine (EXCEDRIN MIGRAINE) [phone] MG tablet Take by mouth every 6 (six) hours as needed for headache.   Yes [provider]  Doxylamine Succinate, Sleep, (SLEEP AID PO) Take by mouth as needed.   Yes [provider]  lisinopril (PRINIVIL,ZESTRIL) 10 MG tablet Take 2 tablets (20 mg total) by mouth daily. 11/10/17  Yes [name], NP    Family History Family History  Problem Relation Age of Onset  . Arthritis Mother   . Heart disease Mother   . Hypertension Mother   . Kidney disease Mother   . Colon cancer Father   . Lung cancer Father   . Prostate cancer Father   . Breast cancer Father   . Lung cancer  Brother   . Lung cancer Paternal Aunt   . Colon cancer Paternal Uncle   . Diabetes Paternal Uncle   . Arthritis Maternal Grandmother   . Colon cancer Maternal Grandmother   . Heart disease Maternal Grandmother   . Heart disease Maternal Grandfather   . Diabetes Paternal Grandfather     Social History Social History   Tobacco Use  . Smoking status: Never Smoker  . Smokeless tobacco: Never Used  Substance Use Topics  . Alcohol use: Yes    Comment: occasional  . Drug use: No     Allergies   Nuvigil [armodafinil]; Seroquel [quetiapine]; and Simvastatin   Review of Systems Review of Systems  All other systems reviewed and are negative.    Physical Exam Updated Vital Signs BP 121/85   Pulse 73   Temp 98.1 F (36.7 C) (Oral)   Resp 13   Ht 1.778 m (5\' 10" )   Wt 118.4 kg (261 lb)   SpO2 95%   BMI 37.45 kg/m   Physical Exam  Constitutional: He is oriented to person, place, and time. He appears well-developed and well-nourished. No distress.  HENT:  Head: Normocephalic and atraumatic.  Right Ear: External ear normal.  Left Ear: External ear normal.  Mouth/Throat:  Oropharynx is clear and moist.  Eyes: Conjunctivae are normal. Right eye exhibits no discharge. Left eye exhibits no discharge. No scleral icterus.  Neck: Neck supple. No tracheal deviation present.  Cardiovascular: Normal rate, regular rhythm and intact distal pulses.  Pulmonary/Chest: Effort normal and breath sounds normal. No stridor. No respiratory distress. He has no wheezes. He has no rales.  Abdominal: Soft. Bowel sounds are normal. He exhibits no distension. There is no tenderness. There is no rebound and no guarding.  Musculoskeletal: He exhibits no edema or tenderness.  Neurological: He is alert and oriented to person, place, and time. He has normal strength. No cranial nerve deficit (no facial droop, extraocular movements intact, no slurred speech) or sensory deficit. He exhibits normal muscle  tone. He displays no seizure activity. Coordination normal.  No pronator drift bilateral upper extrem, able to hold both legs off bed for 5 seconds, sensation intact in all extremities, no visual field cuts, no left or right sided neglect, normal finger-nose exam bilaterally, no nystagmus noted   Skin: Skin is warm and dry. No rash noted.  Psychiatric: He has a normal mood and affect.  Nursing note and vitals reviewed.    ED Treatments / Results  Labs (all labs ordered are listed, but only abnormal results are displayed) Labs Reviewed  CBC WITH DIFFERENTIAL/PLATELET - Abnormal; Notable for the following components:      Result Value   Hemoglobin 17.1 (*)    MCHC 36.2 (*)    All other components within normal limits  I-STAT CHEM 8, ED - Abnormal; Notable for the following components:   Glucose, Bld 109 (*)    All other components within normal limits     Radiology Ct Head Wo Contrast  Result Date: 11/15/2017 CLINICAL DATA:  Headache, double vision, nausea and dizziness. EXAM: CT HEAD WITHOUT CONTRAST TECHNIQUE: Contiguous axial images were obtained from the base of the skull through the vertex without intravenous contrast. COMPARISON:  None. FINDINGS: Brain: No evidence of acute infarction, hemorrhage, hydrocephalus, extra-axial collection or mass lesion/mass effect. Vascular: No hyperdense vessel or unexpected calcification. Skull: Normal. Negative for fracture or focal lesion. Sinuses/Orbits: Mild polypoid mucosal thickening of the left maxillary sinus. Other: None. IMPRESSION: No acute intracranial abnormality. Electronically Signed   By: Fidela Salisbury M.D.   On: 11/15/2017 19:53    Procedures Procedures (including critical care time)  Medications Ordered in ED Medications - No data to display   Initial Impression / Assessment and Plan / ED Course  I have reviewed the triage vital signs and the nursing notes.  Pertinent labs & imaging results that were available during  my care of the patient were reviewed by me and considered in my medical decision making (see chart for details).    Patient presented to the emergency room with complaints of severe headache.  Symptoms have improved without further treatment.  Blood pressure is reassuring now.  Laboratory tests and CT scan did not show any acute abnormalities.  Possible his symptoms may be related to a migraine headache.  I doubt stroke, hemorrhage or other emergent etiology.  Patient is feeling better and ready to go home.  At this time there does not appear to be any evidence of an acute emergency medical condition and the patient appears stable for discharge with appropriate outpatient follow up.   Final Clinical Impressions(s) / ED Diagnoses   Final diagnoses:  Dizziness  Acute nonintractable headache, unspecified headache type    ED Discharge Orders  None       Dorie Rank, MD 11/15/17 2111

## 2017-11-15 NOTE — Telephone Encounter (Signed)
Noted. Please get him into the office at his earliest convenience.

## 2018-01-03 ENCOUNTER — Encounter: Payer: Self-pay | Admitting: Gastroenterology

## 2018-02-10 ENCOUNTER — Ambulatory Visit: Payer: Managed Care, Other (non HMO) | Admitting: Gastroenterology

## 2018-02-10 ENCOUNTER — Telehealth: Payer: Self-pay | Admitting: Gastroenterology

## 2018-02-10 ENCOUNTER — Encounter: Payer: Self-pay | Admitting: Gastroenterology

## 2018-02-10 VITALS — BP 140/84 | HR 84 | Ht 70.0 in | Wt 269.0 lb

## 2018-02-10 DIAGNOSIS — K219 Gastro-esophageal reflux disease without esophagitis: Secondary | ICD-10-CM

## 2018-02-10 DIAGNOSIS — R05 Cough: Secondary | ICD-10-CM | POA: Diagnosis not present

## 2018-02-10 DIAGNOSIS — R059 Cough, unspecified: Secondary | ICD-10-CM

## 2018-02-10 DIAGNOSIS — R152 Fecal urgency: Secondary | ICD-10-CM | POA: Diagnosis not present

## 2018-02-10 MED ORDER — ESOMEPRAZOLE MAGNESIUM 40 MG PO CPDR: 40.0000 mg | DELAYED_RELEASE_CAPSULE | Freq: Every day | ORAL | 2 refills | Status: DC

## 2018-02-10 MED ORDER — OMEPRAZOLE 40 MG PO CPDR: 40.0000 mg | DELAYED_RELEASE_CAPSULE | Freq: Every day | ORAL | 3 refills | Status: DC

## 2018-02-10 NOTE — Telephone Encounter (Signed)
Dr.Danis has already changed the omeprazole to Nexium. Left a voicemail for pt informing of the change.

## 2018-02-10 NOTE — Telephone Encounter (Signed)
Patient states ins will only cover nexium and would like for nexium to be called into [facility] instead of generic

## 2018-02-10 NOTE — Patient Instructions (Signed)
If you are age 54 or older, your body mass index should be between 23-30. Your Body mass index is 38.6 kg/m. If this is out of the aforementioned range listed, please consider follow up with your Primary Care Provider.  If you are age 62 or younger, your body mass index should be between 19-25. Your Body mass index is 38.6 kg/m. If this is out of the aformentioned range listed, please consider follow up with your Primary Care Provider.   You have been scheduled for an endoscopy. Please follow written instructions given to you at your visit today. If you use inhalers (even only as needed), please bring them with you on the day of your procedure. Your physician has requested that you go to www.startemmi.com and enter the access code given to you at your visit today. This web site gives a general overview about your procedure. However, you should still follow specific instructions given to you by our office regarding your preparation for the procedure.  Thank you for choosing [facility]  Dr Wilfrid Lund III    Gastroesophageal Reflux Disease, Adult Normally, food travels down the esophagus and stays in the stomach to be digested. If a person has gastroesophageal reflux disease (GERD), food and stomach acid move back up into the esophagus. When this happens, the esophagus becomes sore and swollen (inflamed). Over time, GERD can make small holes (ulcers) in the lining of the esophagus. Follow these instructions at home: Diet  Follow a diet as told by your doctor. You may need to avoid foods and drinks such as: ? Coffee and tea (with or without caffeine). ? Drinks that contain alcohol. ? Energy drinks and sports drinks. ? Carbonated drinks or sodas. ? Chocolate and cocoa. ? Peppermint and mint flavorings. ? Garlic and onions. ? Horseradish. ? Spicy and acidic foods, such as peppers, chili powder, curry powder, vinegar, hot sauces, and BBQ sauce. ? Citrus fruit juices and citrus fruits, such  as oranges, lemons, and limes. ? Tomato-based foods, such as red sauce, chili, salsa, and pizza with red sauce. ? Fried and fatty foods, such as donuts, french fries, potato chips, and high-fat dressings. ? High-fat meats, such as hot dogs, rib eye steak, sausage, ham, and bacon. ? High-fat dairy items, such as whole milk, butter, and cream cheese.  Eat small meals often. Avoid eating large meals.  Avoid drinking large amounts of liquid with your meals.  Avoid eating meals during the 2-3 hours before bedtime.  Avoid lying down right after you eat.  Do not exercise right after you eat. General instructions  Pay attention to any changes in your symptoms.  Take over-the-counter and prescription medicines only as told by your doctor. Do not take aspirin, ibuprofen, or other NSAIDs unless your doctor says it is okay.  Do not use any tobacco products, including cigarettes, chewing tobacco, and e-cigarettes. If you need help quitting, ask your doctor.  Wear loose clothes. Do not wear anything tight around your waist.  Raise (elevate) the head of your bed about 6 inches (15 cm).  Try to lower your stress. If you need help doing this, ask your doctor.  If you are overweight, lose an amount of weight that is healthy for you. Ask your doctor about a safe weight loss goal.  Keep all follow-up visits as told by your doctor. This is important. Contact a doctor if:  You have new symptoms.  You lose weight and you do not know why it is happening.  You have trouble swallowing, or it hurts to swallow.  You have wheezing or a cough that keeps happening.  Your symptoms do not get better with treatment.  You have a hoarse voice. Get help right away if:  You have pain in your arms, neck, jaw, teeth, or back.  You feel sweaty, dizzy, or light-headed.  You have chest pain or shortness of breath.  You throw up (vomit) and your throw up looks like blood or coffee grounds.  You pass out  (faint).  Your poop (stool) is bloody or black.  You cannot swallow, drink, or eat. This information is not intended to replace advice given to you by your health care provider. Make sure you discuss any questions you have with your health care provider. Document Released: 05/18/2008 Document Revised: 05/07/2016 Document Reviewed: 03/27/2015 Elsevier Interactive Patient Education  2018 [employer] for Gastroesophageal Reflux Disease, Adult When you have gastroesophageal reflux disease (GERD), the foods you eat and your eating habits are very important. Choosing the right foods can help ease your discomfort. What guidelines do I need to follow?  Choose fruits, vegetables, whole grains, and low-fat dairy products.  Choose low-fat meat, fish, and poultry.  Limit fats such as oils, salad dressings, butter, nuts, and avocado.  Keep a food diary. This helps you identify foods that cause symptoms.  Avoid foods that cause symptoms. These may be different for everyone.  Eat small meals often instead of 3 large meals a day.  Eat your meals slowly, in a place where you are relaxed.  Limit fried foods.  Cook foods using methods other than frying.  Avoid drinking alcohol.  Avoid drinking large amounts of liquids with your meals.  Avoid bending over or lying down until 2-3 hours after eating. What foods are not recommended? These are some foods and drinks that may make your symptoms worse: Vegetables Tomatoes. Tomato juice. Tomato and spaghetti sauce. Chili peppers. Onion and garlic. Horseradish. Fruits Oranges, grapefruit, and lemon (fruit and juice). Meats High-fat meats, fish, and poultry. This includes hot dogs, ribs, ham, sausage, salami, and bacon. Dairy Whole milk and chocolate milk. Sour cream. Cream. Butter. Ice cream. Cream cheese. Drinks Coffee and tea. Bubbly (carbonated) drinks or energy drinks. Condiments Hot sauce. Barbecue  sauce. Sweets/Desserts Chocolate and cocoa. Donuts. Peppermint and spearmint. Fats and Oils High-fat foods. This includes [place] fries and potato chips. Other Vinegar. Strong spices. This includes black pepper, white pepper, red pepper, cayenne, curry powder, cloves, ginger, and chili powder. The items listed above may not be a complete list of foods and drinks to avoid. Contact your dietitian for more information. This information is not intended to replace advice given to you by your health care provider. Make sure you discuss any questions you have with your health care provider. Document Released: 05/31/2012 Document Revised: 05/07/2016 Document Reviewed: 10/04/2013 Elsevier Interactive Patient Education  2017 [employer].

## 2018-02-10 NOTE — Addendum Note (Signed)
Addended by: Nelida Meuse on: 02/10/2018 02:47 PM   Modules accepted: Orders

## 2018-02-10 NOTE — Progress Notes (Signed)
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 He reports onset of reflux symptoms years ago with regurgitation and pyrosis, he has never taken anything more than as needed Maalox or Tums for it. It was evaluated in [place] with upper endoscopy perhaps as far back as 2010, no records currently available. This problem seemed to improve over time, but is bothered him more in about the last 6 months. As a separate complaint, he has been bothered by some intermittent left lower quadrant pain which was a severe problem about 2010 while living in [place]. He reports an upper endoscopy and colonoscopy, ultimately had a surgery for adhesions. That problem seemed to improve a lot. For about the last 9 months he has had intermittent pelvic pain he describes as a dull ache, sometimes has urgency for BMs that are typically of normal frequency and form. He also has some urinary symptoms were some days he will hardly urinated all, and other times going every half hour. Sounds like he has previously seen urology for these symptoms and biopsy prostate nodule.  His reflux symptoms have been worse in the last few months where in the evening he will feeling not in the stomach and he has to lay on his right side so he does not have regurgitation that comes up and make some choke.  He also has a chronic dry cough that seems worse if he talks for a long time sometimes when he takes a deep breath. He is wondering if that is  related to the reflux. He denies dysphagia or odynophagia, nausea or vomiting. He has put on at least 20 pounds in the last couple of years. There is a family history of colorectal cancer in his father and his maternal grandmother. Hafiz reports a second colonoscopy done in [place] approximately age 62 which she recalls was normal.  He was seen for left lower quadrant/suprapubic pain at his primary care office in July 2018 and treated for urinary tract infection. That note describes a previous urologic evaluation as well.   ROS:  Review of Systems  Constitutional: Positive for fatigue. Negative for appetite change and unexpected weight change.       He attributes the fatigue to a history of Lyme disease  HENT: Negative for mouth sores and voice change.   Eyes: Negative for pain and redness.  Respiratory: Positive for cough. Negative for shortness of breath.   Cardiovascular: Negative for chest pain and palpitations.  Genitourinary: Negative for dysuria and hematuria.  Musculoskeletal: Negative for arthralgias and myalgias.  Skin: Negative for pallor and rash.  Neurological: Negative for weakness and headaches.  Hematological: Negative for adenopathy.     Past Medical History: Past Medical History:  Diagnosis Date  . Arthritis   . Chronic kidney disease   . Colon polyps    benign per pt  . Hyperlipidemia   . Hypertension   . Lyme disease      Past Surgical History: Past Surgical History:  Procedure Laterality Date  . HERNIA REPAIR  x 2  . NECK SURGERY    . NEPHRECTOMY Left 2010  . SHOULDER SURGERY Left      Family History: Family History  Problem Relation Age of Onset  . Arthritis Mother   . Heart disease Mother   . Hypertension Mother   . Kidney disease Mother   . Colon cancer Father        dx in his early 82's  . Lung cancer Father   . Prostate cancer Father   . Breast cancer Father        34 or 48  . Lung cancer Brother        smoker  . Lung  cancer Paternal Aunt   . Colon cancer Paternal Uncle   . Diabetes Paternal Uncle   . Arthritis Maternal Grandmother   . Colon cancer Maternal Grandmother   . Heart disease Maternal Grandmother   . Heart disease Maternal Grandfather   . Diabetes Paternal Grandfather   . Lung cancer Brother        smoker  . Bladder Cancer Brother     Social History: Social History   Socioeconomic History  . Marital status: Married    Spouse name: None  . Number of children: 4  . Years of education: None  . Highest education level: None  Social Needs  . Financial resource strain: None  . Food insecurity - worry: None  . Food insecurity - inability: None  . Transportation needs - medical: None  . Transportation needs - non-medical: None  Occupational History  . Occupation: Freight forwarder  Tobacco Use  . Smoking status: Never Smoker  . Smokeless tobacco: Never Used  Substance and Sexual Activity  . Alcohol use: Yes    Comment: occasional  . Drug use: No  . Sexual activity: None  Other Topics Concern  . None  Social History Narrative  . None    Allergies: Allergies  Allergen Reactions  . Nuvigil [Armodafinil] Other (See Comments)    Seizures   . Seroquel [Quetiapine] Other (See Comments)    Seizures.  . Simvastatin Hives    Outpatient Meds: Current Outpatient Medications  Medication Sig Dispense Refill  . aspirin-acetaminophen-caffeine (EXCEDRIN MIGRAINE) 250-250-65 MG tablet Take by mouth every 6 (six) hours as needed for headache.    . Cholecalciferol (VITAMIN D3) 5000 units CAPS Take 1 capsule by mouth daily.    . Doxylamine Succinate, Sleep, (SLEEP AID PO) Take by mouth as needed.    Marland Kitchen lisinopril (PRINIVIL,ZESTRIL) 10 MG tablet Take 2 tablets (20 mg total) by mouth daily. 90 tablet 0  . omeprazole (PRILOSEC) 40 MG capsule Take 1 capsule (40 mg total) by mouth daily. 30 capsule 3   No current facility-administered medications for this visit.        ___________________________________________________________________ Objective   Exam:  BP 140/84   Pulse 84   Ht 5\' 10"  (1.778 m)   Wt 269 lb (122 kg)   BMI 38.60 kg/m    General: this is a(n) pleasant well-appearing man   Eyes: sclera anicteric, no redness  ENT: oral mucosa moist without lesions, no cervical or supraclavicular lymphadenopathy, good dentition  CV: RRR without murmur, S1/S2, no JVD, no peripheral edema  Resp: clear to auscultation bilaterally, normal RR and effort noted  GI: soft, obese, no tenderness, with active bowel sounds. No guarding or palpable organomegaly noted.  Skin; warm and dry, no rash or jaundice noted  Neuro: awake, alert and oriented x 3. Normal gross motor function and  fluent speech  Labs:  CBC Latest Ref Rng & Units 11/15/2017 11/15/2017 11/10/2017  WBC 4.0 - 10.5 K/uL - 9.8 9.1  Hemoglobin 13.0 - 17.0 g/dL 16.7 17.1(H) 16.8  Hematocrit 39.0 - 52.0 % 49.0 47.2 47.6  Platelets 150 - 400 K/uL - 221 257.0   CMP Latest Ref Rng & Units 11/15/2017 11/10/2017 06/18/2017  Glucose 65 - 99 mg/dL 109(H) 115(H) 90  BUN 6 - 20 mg/dL 12 16 10   Creatinine 0.61 - 1.24 mg/dL 1.00 1.07 1.01  Sodium 135 - 145 mmol/L 139 137 136  Potassium 3.5 - 5.1 mmol/L 3.6 4.1 4.3  Chloride 101 - 111 mmol/L 101 100 100  CO2 19 - 32 mEq/L - 30 27  Calcium 8.4 - 10.5 mg/dL - 9.7 9.5  Total Protein 6.1 - 8.1 g/dL - - 6.9  Total Bilirubin 0.2 - 1.2 mg/dL - - 0.4  Alkaline Phos 40 - 115 U/L - - 45  AST 10 - 35 U/L - - 27  ALT 9 - 46 U/L - - 29    No abdominal imaging for review   Assessment: Encounter Diagnoses  Name Primary?  . Gastroesophageal reflux disease, esophagitis presence not specified Yes  . Cough   . Rectal urgency     He has reflux that is especially bothersome at night. I suspect there may be some hypersensitivity overlay since he reports often getting heartburn just drinking water. The nature of his vague pelvic pain is uncertain.  Given that there is some rectal urgency, perhaps it is digestive, if so it sounds most like mild functional symptoms. I encouraged him to obtain consultation with a new urologist given the ancillary urinary symptoms. His chronic cough seems unlikely to be related to reflux, sounds more like allergic, possibly reactive airway disease.  Plan:  Omeprazole 40 mg once daily Diet and lifestyle anti-reflux measures reviewed, especially elevating head of bed, maintaining healthy weight, not eating within several hours of bedtime. Upper endoscopy  He does not appear to need a colonoscopy at this time.  Thank you for the courtesy of this consult.  Please call me with any questions or concerns.  Nelida Meuse III  CC: [name], NP

## 2018-02-17 ENCOUNTER — Ambulatory Visit (AMBULATORY_SURGERY_CENTER): Payer: Managed Care, Other (non HMO) | Admitting: Gastroenterology

## 2018-02-17 ENCOUNTER — Other Ambulatory Visit: Payer: Self-pay

## 2018-02-17 ENCOUNTER — Encounter: Payer: Self-pay | Admitting: Gastroenterology

## 2018-02-17 VITALS — BP 121/76 | HR 70 | Temp 97.8°F | Resp 12 | Ht 70.0 in | Wt 268.0 lb

## 2018-02-17 DIAGNOSIS — K219 Gastro-esophageal reflux disease without esophagitis: Secondary | ICD-10-CM

## 2018-02-17 DIAGNOSIS — K229 Disease of esophagus, unspecified: Secondary | ICD-10-CM | POA: Diagnosis not present

## 2018-02-17 MED ORDER — SODIUM CHLORIDE 0.9 % IV SOLN: 500.0000 mL | Freq: Once | INTRAVENOUS | Status: DC

## 2018-02-17 NOTE — Op Note (Signed)
[facility] Patient Name: Evan [name] Procedure Date: 02/17/2018 1:17 PM MRN: [id number] Endoscopist: Mallie Mussel L. Loletha Carrow , MD Age: 52 Referring MD:  Date of Birth: 28-Apr-1966 Gender: Male Account #: [id number] Procedure:                Upper GI endoscopy Indications:              Esophageal reflux Medicines:                Monitored Anesthesia Care Procedure:                Pre-Anesthesia Assessment:                           - Prior to the procedure, a History and Physical                            was performed, and patient medications and                            allergies were reviewed. The patient's tolerance of                            previous anesthesia was also reviewed. The risks                            and benefits of the procedure and the sedation                            options and risks were discussed with the patient.                            All questions were answered, and informed consent                            was obtained. Prior Anticoagulants: The patient has                            taken no previous anticoagulant or antiplatelet                            agents. ASA Grade Assessment: II - A patient with                            mild systemic disease. After reviewing the risks                            and benefits, the patient was deemed in                            satisfactory condition to undergo the procedure.                           After obtaining informed consent, the endoscope was  passed under direct vision. Throughout the                            procedure, the patient's blood pressure, pulse, and                            oxygen saturations were monitored continuously. The                            Model GIF-HQ190 [id number]) scope was introduced                            through the mouth, and advanced to the second part                            of duodenum. The upper GI endoscopy  was                            accomplished without difficulty. The patient                            tolerated the procedure well. Scope In: Scope Out: Findings:                 The larynx was normal.                           Scattered mucosal changes characterized by                            benign-appearing, slightly raised, white plaques                            were found in the middle third of the esophagus and                            in the lower third of the esophagus. Biopsies were                            taken with a cold forceps for histology.                           The exam of the esophagus was otherwise normal.                           The stomach was normal.                           The cardia and gastric fundus were normal on                            retroflexion.                           The examined duodenum was normal. Complications:  No immediate complications. Estimated Blood Loss:     Estimated blood loss was minimal. Impression:               - Normal larynx.                           - Slightly raised, white plaques mucosa in the                            esophagus. Biopsied.                           - Normal stomach.                           - Normal examined duodenum. Recommendation:           - Patient has a contact number available for                            emergencies. The signs and symptoms of potential                            delayed complications were discussed with the                            patient. Return to normal activities tomorrow.                            Written discharge instructions were provided to the                            patient.                           - Resume previous diet.                           - Continue present medications.                           - Await pathology results.                           - Follow an antireflux regimen indefinitely. This                             is at least as important as medication in                            controlling GERD symptoms. Evan [name] L. Loletha Carrow, MD 02/17/2018 1:39:33 PM This report has been signed electronically.

## 2018-02-17 NOTE — Progress Notes (Signed)
Called to room to assist during endoscopic procedure.  Patient ID and intended procedure confirmed with present staff. Received instructions for my participation in the procedure from the performing physician.  

## 2018-02-17 NOTE — Progress Notes (Signed)
Pt's states no medical or surgical changes since previsit or office visit.  Patient only takes 1 nexium, and he states that it helps his heartburn.

## 2018-02-17 NOTE — Progress Notes (Signed)
A/ox3 pleased with MAC, report to RN 

## 2018-02-17 NOTE — Patient Instructions (Signed)
**   Handout given on GERD **   YOU HAD AN ENDOSCOPIC PROCEDURE TODAY AT THE [facility]:   Refer to the procedure report that was given to you for any specific questions about what was found during the examination.  If the procedure report does not answer your questions, please call your gastroenterologist to clarify.  If you requested that your care partner not be given the details of your procedure findings, then the procedure report has been included in a sealed envelope for you to review at your convenience later.  YOU SHOULD EXPECT: Some feelings of bloating in the abdomen. Passage of more gas than usual.  Walking can help get rid of the air that was put into your GI tract during the procedure and reduce the bloating. If you had a lower endoscopy (such as a colonoscopy or flexible sigmoidoscopy) you may notice spotting of blood in your stool or on the toilet paper. If you underwent a bowel prep for your procedure, you may not have a normal bowel movement for a few days.  Please Note:  You might notice some irritation and congestion in your nose or some drainage.  This is from the oxygen used during your procedure.  There is no need for concern and it should clear up in a day or so.  SYMPTOMS TO REPORT IMMEDIATELY:   Following upper endoscopy (EGD)  Vomiting of blood or coffee ground material  New chest pain or pain under the shoulder blades  Painful or persistently difficult swallowing  New shortness of breath  Fever of 100F or higher  Black, tarry-looking stools  For urgent or emergent issues, a gastroenterologist can be reached at any hour by calling [phone].   DIET:  We do recommend a small meal at first, but then you may proceed to your regular diet.  Drink plenty of fluids but you should avoid alcoholic beverages for 24 hours.  ACTIVITY:  You should plan to take it easy for the rest of today and you should NOT DRIVE or use heavy machinery until tomorrow (because  of the sedation medicines used during the test).    FOLLOW UP: Our staff will call the number listed on your records the next business day following your procedure to check on you and address any questions or concerns that you may have regarding the information given to you following your procedure. If we do not reach you, we will leave a message.  However, if you are feeling well and you are not experiencing any problems, there is no need to return our call.  We will assume that you have returned to your regular daily activities without incident.  If any biopsies were taken you will be contacted by phone or by letter within the next 1-3 weeks.  Please call us at [phone] if you have not heard about the biopsies in 3 weeks.    SIGNATURES/CONFIDENTIALITY: You and/or your care partner have signed paperwork which will be entered into your electronic medical record.  These signatures attest to the fact that that the information above on your After Visit Summary has been reviewed and is understood.  Full responsibility of the confidentiality of this discharge information lies with you and/or your care-partner. 

## 2018-02-18 ENCOUNTER — Telehealth: Payer: Self-pay | Admitting: *Deleted

## 2018-02-18 NOTE — Telephone Encounter (Signed)
  Follow up Call-  Call back number 02/17/2018  Post procedure Call Back phone  # [phone]  Permission to leave phone message Yes     Patient questions:  Do you have a fever, pain , or abdominal swelling? No. Pain Score  0 *  Have you tolerated food without any problems? Yes.    Have you been able to return to your normal activities? Yes.    Do you have any questions about your discharge instructions: Diet   No. Medications  No. Follow up visit  No.  Do you have questions or concerns about your Care? No.  Actions: * If pain score is 4 or above: No action needed, pain <4.

## 2018-02-25 ENCOUNTER — Other Ambulatory Visit: Payer: Self-pay

## 2018-02-25 MED ORDER — FLUCONAZOLE 100 MG PO TABS: 100.0000 mg | ORAL_TABLET | Freq: Every day | ORAL | 0 refills | Status: DC

## 2018-03-28 ENCOUNTER — Other Ambulatory Visit: Payer: Self-pay | Admitting: Primary Care

## 2018-03-28 DIAGNOSIS — I10 Essential (primary) hypertension: Secondary | ICD-10-CM

## 2018-04-13 ENCOUNTER — Ambulatory Visit: Payer: Managed Care, Other (non HMO) | Admitting: Gastroenterology

## 2018-05-03 ENCOUNTER — Encounter (HOSPITAL_COMMUNITY): Payer: Self-pay

## 2018-05-03 ENCOUNTER — Other Ambulatory Visit: Payer: Self-pay

## 2018-05-03 ENCOUNTER — Observation Stay (HOSPITAL_COMMUNITY)
Admission: EM | Admit: 2018-05-03 | Discharge: 2018-05-04 | Disposition: A | Discharge status: Home / Self Care | Payer: Managed Care, Other (non HMO) | Attending: Family Medicine | Admitting: Family Medicine

## 2018-05-03 ENCOUNTER — Emergency Department (HOSPITAL_COMMUNITY): Payer: Managed Care, Other (non HMO)

## 2018-05-03 DIAGNOSIS — Z79899 Other long term (current) drug therapy: Secondary | ICD-10-CM | POA: Insufficient documentation

## 2018-05-03 DIAGNOSIS — Z841 Family history of disorders of kidney and ureter: Secondary | ICD-10-CM | POA: Diagnosis not present

## 2018-05-03 DIAGNOSIS — Z8619 Personal history of other infectious and parasitic diseases: Secondary | ICD-10-CM | POA: Diagnosis not present

## 2018-05-03 DIAGNOSIS — Z8249 Family history of ischemic heart disease and other diseases of the circulatory system: Secondary | ICD-10-CM | POA: Diagnosis not present

## 2018-05-03 DIAGNOSIS — R079 Chest pain, unspecified: Secondary | ICD-10-CM | POA: Diagnosis not present

## 2018-05-03 DIAGNOSIS — K210 Gastro-esophageal reflux disease with esophagitis: Secondary | ICD-10-CM | POA: Diagnosis not present

## 2018-05-03 DIAGNOSIS — M1990 Unspecified osteoarthritis, unspecified site: Secondary | ICD-10-CM | POA: Diagnosis not present

## 2018-05-03 DIAGNOSIS — N189 Chronic kidney disease, unspecified: Secondary | ICD-10-CM | POA: Diagnosis not present

## 2018-05-03 DIAGNOSIS — N1330 Unspecified hydronephrosis: Secondary | ICD-10-CM | POA: Insufficient documentation

## 2018-05-03 DIAGNOSIS — E669 Obesity, unspecified: Secondary | ICD-10-CM | POA: Insufficient documentation

## 2018-05-03 DIAGNOSIS — I5030 Unspecified diastolic (congestive) heart failure: Secondary | ICD-10-CM | POA: Insufficient documentation

## 2018-05-03 DIAGNOSIS — Z9889 Other specified postprocedural states: Secondary | ICD-10-CM | POA: Insufficient documentation

## 2018-05-03 DIAGNOSIS — I200 Unstable angina: Principal | ICD-10-CM | POA: Insufficient documentation

## 2018-05-03 DIAGNOSIS — Z7982 Long term (current) use of aspirin: Secondary | ICD-10-CM | POA: Diagnosis not present

## 2018-05-03 DIAGNOSIS — E785 Hyperlipidemia, unspecified: Secondary | ICD-10-CM | POA: Insufficient documentation

## 2018-05-03 DIAGNOSIS — Z86010 Personal history of colonic polyps: Secondary | ICD-10-CM | POA: Insufficient documentation

## 2018-05-03 DIAGNOSIS — I10 Essential (primary) hypertension: Secondary | ICD-10-CM | POA: Diagnosis present

## 2018-05-03 DIAGNOSIS — Z905 Acquired absence of kidney: Secondary | ICD-10-CM | POA: Diagnosis not present

## 2018-05-03 DIAGNOSIS — Z8261 Family history of arthritis: Secondary | ICD-10-CM | POA: Diagnosis not present

## 2018-05-03 DIAGNOSIS — Z6841 Body Mass Index (BMI) 40.0 and over, adult: Secondary | ICD-10-CM | POA: Diagnosis not present

## 2018-05-03 DIAGNOSIS — Z888 Allergy status to other drugs, medicaments and biological substances status: Secondary | ICD-10-CM | POA: Diagnosis not present

## 2018-05-03 DIAGNOSIS — I130 Hypertensive heart and chronic kidney disease with heart failure and stage 1 through stage 4 chronic kidney disease, or unspecified chronic kidney disease: Secondary | ICD-10-CM | POA: Diagnosis not present

## 2018-05-03 LAB — URINALYSIS, ROUTINE W REFLEX MICROSCOPIC
Bilirubin Urine: NEGATIVE
Glucose, UA: NEGATIVE mg/dL
Hgb urine dipstick: NEGATIVE
KETONES UR: NEGATIVE mg/dL
Leukocytes, UA: NEGATIVE
Nitrite: NEGATIVE
PH: 7 (ref 5.0–8.0)
Protein, ur: NEGATIVE mg/dL
Specific Gravity, Urine: 1.026 (ref 1.005–1.030)

## 2018-05-03 LAB — BASIC METABOLIC PANEL
Anion gap: 8 (ref 5–15)
BUN: 13 mg/dL (ref 6–20)
CALCIUM: 9.2 mg/dL (ref 8.9–10.3)
CHLORIDE: 106 mmol/L (ref 101–111)
CO2: 22 mmol/L (ref 22–32)
CREATININE: 1.07 mg/dL (ref 0.61–1.24)
GFR calc Af Amer: >60 mL/min (ref >60)
GFR calc non Af Amer: >60 mL/min (ref >60)
Glucose, Bld: 100 mg/dL — ABNORMAL HIGH (ref 65–99)
Potassium: 4.2 mmol/L (ref 3.5–5.1)
SODIUM: 136 mmol/L (ref 135–145)

## 2018-05-03 LAB — RAPID URINE DRUG SCREEN, HOSP PERFORMED
AMPHETAMINES: NOT DETECTED
Barbiturates: NOT DETECTED
Benzodiazepines: NOT DETECTED
COCAINE: NOT DETECTED
Opiates: NOT DETECTED
Tetrahydrocannabinol: NOT DETECTED

## 2018-05-03 LAB — CBC
HCT: 48.6 % (ref 39.0–52.0)
Hemoglobin: 17.4 g/dL — ABNORMAL HIGH (ref 13.0–17.0)
MCH: 32.5 pg (ref 26.0–34.0)
MCHC: 35.8 g/dL (ref 30.0–36.0)
MCV: 90.7 fL (ref 78.0–100.0)
PLATELETS: 270 10*3/uL (ref 150–400)
RBC: 5.36 MIL/uL (ref 4.22–5.81)
RDW: 13.1 % (ref 11.5–15.5)
WBC: 9.4 10*3/uL (ref 4.0–10.5)

## 2018-05-03 LAB — TROPONIN I: Troponin I: <0.03 ng/mL (ref <0.03)

## 2018-05-03 LAB — BRAIN NATRIURETIC PEPTIDE: B Natriuretic Peptide: 11.7 pg/mL (ref 0.0–100.0)

## 2018-05-03 LAB — D-DIMER, QUANTITATIVE: D-Dimer, Quant: 0.29 ug/mL-FEU (ref 0.00–0.50)

## 2018-05-03 LAB — I-STAT TROPONIN, ED: Troponin i, poc: 0 ng/mL (ref 0.00–0.08)

## 2018-05-03 MED ORDER — NITROGLYCERIN 0.4 MG SL SUBL
0.4000 mg | SUBLINGUAL_TABLET | SUBLINGUAL | Status: DC | PRN
  Administered 2018-05-03: 0.4 mg via SUBLINGUAL
  Filled 2018-05-03: qty 1

## 2018-05-03 MED ORDER — CARVEDILOL 6.25 MG PO TABS
6.2500 mg | ORAL_TABLET | Freq: Two times a day (BID) | ORAL | Status: DC
  Administered 2018-05-03 – 2018-05-04 (×2): 6.25 mg via ORAL
  Filled 2018-05-03 (×2): qty 1

## 2018-05-03 MED ORDER — ENOXAPARIN SODIUM 40 MG/0.4ML ~~LOC~~ SOLN
40.0000 mg | SUBCUTANEOUS | Status: DC
  Administered 2018-05-03: 40 mg via SUBCUTANEOUS
  Filled 2018-05-03: qty 0.4

## 2018-05-03 MED ORDER — ASPIRIN 81 MG PO CHEW
324.0000 mg | CHEWABLE_TABLET | Freq: Once | ORAL | Status: AC
  Administered 2018-05-03: 324 mg via ORAL
  Filled 2018-05-03: qty 4

## 2018-05-03 MED ORDER — ONDANSETRON HCL 4 MG PO TABS: 4.0000 mg | ORAL_TABLET | Freq: Four times a day (QID) | ORAL | Status: DC | PRN

## 2018-05-03 MED ORDER — ATORVASTATIN CALCIUM 40 MG PO TABS
40.0000 mg | ORAL_TABLET | Freq: Every day | ORAL | Status: DC
  Filled 2018-05-03: qty 1

## 2018-05-03 MED ORDER — ONDANSETRON HCL 4 MG/2ML IJ SOLN: 4.0000 mg | Freq: Four times a day (QID) | INTRAMUSCULAR | Status: DC | PRN

## 2018-05-03 MED ORDER — PANTOPRAZOLE SODIUM 40 MG PO TBEC
40.0000 mg | DELAYED_RELEASE_TABLET | Freq: Every day | ORAL | Status: DC
  Administered 2018-05-03 – 2018-05-04 (×2): 40 mg via ORAL
  Filled 2018-05-03 (×2): qty 1

## 2018-05-03 NOTE — ED Notes (Signed)
ED TO INPATIENT HANDOFF REPORT  Name/Age/Gender Evan [name] 52 y.o. male  Code Status   Home/SNF/Other Home  Chief Complaint Presure in Chest; Neck/Jaw Ache; Dizziness  Level of Care/Admitting Diagnosis ED Disposition    ED Disposition Condition [facility]: [place] [[id number]]  Level of Care: Telemetry [5]  Admit to tele based on following criteria: Other see comments  Comments: Chest Pain  Diagnosis: Chest pain [163845]  Admitting Physician: Bethena Roys [[id number]]  Attending Physician: Bethena Roys [phone]  PT Class (Do Not Modify): Observation [104]  PT Acc Code (Do Not Modify): Observation [10022]       Medical History Past Medical History:  Diagnosis Date  . Arthritis   . Chronic kidney disease   . Colon polyps    benign per pt  . Hyperlipidemia   . Hypertension   . Lyme disease     Allergies Allergies  Allergen Reactions  . Nuvigil [Armodafinil] Other (See Comments)    Seizures   . Seroquel [Quetiapine] Other (See Comments)    Seizures.  . Simvastatin Hives    IV Location/Drains/Wounds Patient Lines/Drains/Airways Status   Active Line/Drains/Airways    Name:   Placement date:   Placement time:   Site:   Days:   Peripheral IV 05/03/18 Left Antecubital   05/03/18    1634    Antecubital   less than 1          Labs/Imaging Results for orders placed or performed during the hospital encounter of 05/03/18 (from the past 48 hour(s))  Basic metabolic panel     Status: Abnormal   Collection Time: 05/03/18  3:23 PM  Result Value Ref Range   Sodium 136 135 - 145 mmol/L   Potassium 4.2 3.5 - 5.1 mmol/L   Chloride 106 101 - 111 mmol/L   CO2 22 22 - 32 mmol/L   Glucose, Bld 100 (H) 65 - 99 mg/dL   BUN 13 6 - 20 mg/dL   Creatinine, Ser 1.07 0.61 - 1.24 mg/dL   Calcium 9.2 8.9 - 10.3 mg/dL   GFR calc non Af Amer >60 >60 mL/min   GFR calc Af Amer >60 >60 mL/min    Comment: (NOTE) The eGFR has  been calculated using the CKD EPI equation. This calculation has not been validated in all clinical situations. eGFR's persistently <60 mL/min signify possible Chronic Kidney Disease.    Anion gap 8 5 - 15    Comment: Performed at [facility], [place] [address], [place], [place] [address]  CBC     Status: Abnormal   Collection Time: 05/03/18  3:23 PM  Result Value Ref Range   WBC 9.4 4.0 - 10.5 K/uL    Comment: REPEATED TO VERIFY WHITE COUNT CONFIRMED ON SMEAR    RBC 5.36 4.22 - 5.81 MIL/uL   Hemoglobin 17.4 (H) 13.0 - 17.0 g/dL   HCT 48.6 39.0 - 52.0 %   MCV 90.7 78.0 - 100.0 fL   MCH 32.5 26.0 - 34.0 pg   MCHC 35.8 30.0 - 36.0 g/dL   RDW 13.1 11.5 - 15.5 %   Platelets 270 150 - 400 K/uL    Comment: Performed at [facility], [place] [address], [place], [place] [address]  I-stat troponin, ED     Status: None   Collection Time: 05/03/18  3:29 PM  Result Value Ref Range   Troponin i, poc 0.00 0.00 - 0.08 ng/mL  Comment 3            Comment: Due to the release kinetics of cTnI, a negative result within the first hours of the onset of symptoms does not rule out myocardial infarction with certainty. If myocardial infarction is still suspected, repeat the test at appropriate intervals.   Brain natriuretic peptide     Status: None   Collection Time: 05/03/18  4:34 PM  Result Value Ref Range   B Natriuretic Peptide 11.7 0.0 - 100.0 pg/mL    Comment: Performed at [facility], [place] [address] [place], [place] [address]  D-dimer, quantitative     Status: None   Collection Time: 05/03/18  4:34 PM  Result Value Ref Range   D-Dimer, Quant 0.29 0.00 - 0.50 ug/mL-FEU    Comment: (NOTE) At the manufacturer cut-off of 0.50 ug/mL FEU, this assay has been documented to exclude PE with a sensitivity and negative predictive value of 97 to 99%.  At this time, this assay has not been approved by the FDA to exclude DVT/VTE. Results should  be correlated with clinical presentation. Performed at [facility], [place] [address] [place], [place] [address]    Dg Chest 2 View  Result Date: 05/03/2018 CLINICAL DATA:  Intermittent left-sided and central chest pain since yesterday. EXAM: CHEST - 2 VIEW COMPARISON:  None. FINDINGS: The heart size and mediastinal contours are within normal limits. Minimal aortic atherosclerosis at the arch. Minimal subsegmental atelectasis identified at the left lung base. No pulmonary consolidation or CHF. No effusion or pneumothorax. Lower cervical ACDF hardware in place. IMPRESSION: No active cardiopulmonary disease. Electronically Signed   By: Ashley Royalty M.D.   On: 05/03/2018 15:51    Pending Labs Unresulted Labs (From admission, onward)   Start     Ordered   05/04/18 0500  Lipid panel  Tomorrow morning,   R     05/03/18 2001   05/03/18 2100  Troponin I (q 6hr x 3)  Now then every 6 hours,   R     05/03/18 1826   05/03/18 1827  Troponin I  Add-on,   R     05/03/18 1826   Signed and Held  HIV antibody (Routine Testing)  Once,   R     Signed and Held   Signed and Held  Urinalysis, Routine w reflex microscopic  Once,   R     Signed and Held   Signed and Held  Urine rapid drug screen (hosp performed)  STAT,   R     Signed and Held      Vitals/Pain Today's Vitals   05/03/18 1615 05/03/18 1700 05/03/18 1714 05/03/18 1942  BP: (!) 148/97 (!) 152/109 (!) 142/89 (!) 148/104  Pulse: 88 76 80 77  Resp:  13 18 16   Temp:      TempSrc:      SpO2: 98% 94% 92% 97%  Weight:      Height:      PainSc:        Isolation Precautions No active isolations  Medications Medications  nitroGLYCERIN (NITROSTAT) SL tablet 0.4 mg (0.4 mg Sublingual Given 05/03/18 1715)  atorvastatin (LIPITOR) tablet 40 mg (has no administration in time range)  aspirin chewable tablet 324 mg (324 mg Oral Given 05/03/18 1626)    Mobility walks

## 2018-05-03 NOTE — ED Triage Notes (Signed)
Pt reports feeling intermittent central/left side chest pain, jaw pain/neck pain starting yesterday while walking at job. Reports feeling "tired" and "just don't feel like myself lately." Reports increased shortness of breath, bilateral leg swelling (more on left leg) + prolonged dry cough. Taking lisinopril. Speaking in full sentences in triage.

## 2018-05-03 NOTE — ED Provider Notes (Signed)
[facility] DEPT Provider Note   CSN: [id number] Arrival date & time: 05/03/18  1400     History   Chief Complaint Chief Complaint  Patient presents with  . Chest Pain  . Shortness of Breath    HPI Evan [name] is a 52 y.o. male.  HPI  52 year old male with a history of chronic kidney disease and one kidney, hypertension, hyperlipidemia, presents with chest pain or shortness of breath.  He states he has not been feeling well since yesterday.  Is had on and off dizziness as well as chest discomfort that he describes mostly as a heaviness.  Occasionally he will get a sharp pain but it is mostly heaviness.  At work he pretty uch had it all day because he is up and moving around.  When he rests it seems to get better.  Currently he has some residual tightness but is not that bad.  He has had some bilateral leg swelling, left worse than right, for a few weeks as well as some shortness of breath but now is having worsening shortness of breath when he has the chest pressure as well as some neck discomfort.  He does not smoke.  His mom had a CABG at age 98.  Yesterday while feeling that he checked his blood pressure nose 160/109.  He has tried to set up a cardiology appointment but will not be seen until June 12.  Past Medical History:  Diagnosis Date  . Arthritis   . Chronic kidney disease   . Colon polyps    benign per pt  . Hyperlipidemia   . Hypertension   . Lyme disease     Patient Active Problem List   Diagnosis Date Noted  . 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 Essential hypertension 04/14/2017  . Hyperlipidemia 04/14/2017  . Arthralgia 04/14/2017    Past Surgical History:  Procedure Laterality Date  . HERNIA REPAIR     x 2  . NECK SURGERY    . NEPHRECTOMY Left 2010  . SHOULDER SURGERY Left         Home Medications    Prior to Admission medications   Medication Sig Start Date End Date Taking? Authorizing Provider    aspirin-acetaminophen-caffeine (EXCEDRIN MIGRAINE) [phone] MG tablet Take by mouth every 6 (six) hours as needed for headache.   Yes [provider]  betamethasone dipropionate (DIPROLENE) 0.05 % cream APPLY TO AFFECTED AREA UP TO TWICE A DAY AS NEEDED. DO NOT APPLY TO FACE, GROIN, OR UNDER ARMS. 02/16/18  Yes [provider]  Cholecalciferol (VITAMIN D3) 5000 units CAPS Take 1 capsule by mouth daily.   Yes [provider]  Doxylamine Succinate, Sleep, (SLEEP AID PO) Take by mouth as needed.   Yes [provider]  lisinopril (PRINIVIL,ZESTRIL) 10 MG tablet Take 1 tablet (10 mg total) by mouth daily. 03/29/18  Yes [name], NP  omeprazole (PRILOSEC) 20 MG capsule Take 20 mg by mouth daily.   Yes [provider]  esomeprazole (NEXIUM) 40 MG capsule Take 1 capsule (40 mg total) by mouth daily with breakfast. Patient not taking: Reported on 05/03/2018 02/10/18   Doran Stabler, MD  fluconazole (DIFLUCAN) 100 MG tablet Take 1 tablet (100 mg total) by mouth daily. Patient not taking: Reported on 05/03/2018 02/25/18   Doran Stabler, MD    Family History Family History  Problem Relation Age of Onset  . Arthritis Mother   . Heart disease Mother   .  Hypertension Mother   . Kidney disease Mother   . Colon cancer Father        dx in his early 91's  . Lung cancer Father   . Prostate cancer Father   . Breast cancer Father        95 or 30  . Lung cancer Brother        smoker  . Lung cancer Paternal Aunt   . Colon cancer Paternal Uncle   . Diabetes Paternal Uncle   . Arthritis Maternal Grandmother   . Colon cancer Maternal Grandmother   . Heart disease Maternal Grandmother   . Heart disease Maternal Grandfather   . Diabetes Paternal Grandfather   . Lung cancer Brother        smoker  . Bladder Cancer Brother     Social History Social History   Tobacco Use  . Smoking status: Never Smoker  . Smokeless tobacco: Never Used   Substance Use Topics  . Alcohol use: Yes    Comment: occasional  . Drug use: No     Allergies   Nuvigil [armodafinil]; Seroquel [quetiapine]; and Simvastatin   Review of Systems Review of Systems  Respiratory: Positive for chest tightness and shortness of breath.   Cardiovascular: Positive for chest pain and leg swelling.  Gastrointestinal: Negative for abdominal pain.  Neurological: Positive for dizziness.  All other systems reviewed and are negative.    Physical Exam Updated Vital Signs BP (!) 142/89   Pulse 80   Temp 98.1 F (36.7 C) (Oral)   Resp 18   Ht 5\' 10"  (1.778 m)   Wt 117.9 kg (260 lb)   SpO2 92%   BMI 37.31 kg/m   Physical Exam  Constitutional: He is oriented to person, place, and time. He appears well-developed and well-nourished.  Non-toxic appearance. No distress.  HENT:  Head: Normocephalic and atraumatic.  Right Ear: External ear normal.  Left Ear: External ear normal.  Nose: Nose normal.  Eyes: Right eye exhibits no discharge. Left eye exhibits no discharge.  Neck: Neck supple.  Cardiovascular: Normal rate, regular rhythm and normal heart sounds.  Pulses:      Radial pulses are 2+ on the right side, and 2+ on the left side.  Pulmonary/Chest: Effort normal and breath sounds normal. He exhibits no tenderness.  Abdominal: Soft. There is no tenderness.  Musculoskeletal:       Right lower leg: He exhibits edema.       Left lower leg: He exhibits edema.  Minimal pitting edema to BLE. Tenderness to Left calf  Neurological: He is alert and oriented to person, place, and time.  Skin: Skin is warm and dry. He is not diaphoretic.  Nursing note and vitals reviewed.    ED Treatments / Results  Labs (all labs ordered are listed, but only abnormal results are displayed) Labs Reviewed  BASIC METABOLIC PANEL - Abnormal; Notable for the following components:      Result Value   Glucose, Bld 100 (*)    All other components within normal limits  CBC  - Abnormal; Notable for the following components:   Hemoglobin 17.4 (*)    All other components within normal limits  BRAIN NATRIURETIC PEPTIDE  D-DIMER, QUANTITATIVE (NOT AT [facility])  TROPONIN I  TROPONIN I  TROPONIN I  I-STAT TROPONIN, ED    EKG EKG Interpretation  Date/Time:  Tuesday May 03 2018 14:42:04 EDT Ventricular Rate:  95 PR Interval:    QRS Duration: 85 QT Interval:  334 QTC Calculation: 420 R Axis:   -68 Text Interpretation:  Sinus rhythm Left anterior fascicular block Anterior infarct, old Baseline wander in lead(s) V3 Confirmed by Tanna Furry [phone]) on 05/03/2018 3:16:10 PM   Radiology Dg Chest 2 View  Result Date: 05/03/2018 CLINICAL DATA:  Intermittent left-sided and central chest pain since yesterday. EXAM: CHEST - 2 VIEW COMPARISON:  None. FINDINGS: The heart size and mediastinal contours are within normal limits. Minimal aortic atherosclerosis at the arch. Minimal subsegmental atelectasis identified at the left lung base. No pulmonary consolidation or CHF. No effusion or pneumothorax. Lower cervical ACDF hardware in place. IMPRESSION: No active cardiopulmonary disease. Electronically Signed   By: Ashley Royalty M.D.   On: 05/03/2018 15:51    Procedures Procedures (including critical care time)  Medications Ordered in ED Medications  nitroGLYCERIN (NITROSTAT) SL tablet 0.4 mg (0.4 mg Sublingual Given 05/03/18 1715)  aspirin chewable tablet 324 mg (324 mg Oral Given 05/03/18 1626)     Initial Impression / Assessment and Plan / ED Course  I have reviewed the triage vital signs and the nursing notes.  Pertinent labs & imaging results that were available during my care of the patient were reviewed by me and considered in my medical decision making (see chart for details).     52 year old male presents with concerning chest pain.  His chest symptoms and neck pain went away after one nitroglycerin.  He was given 4 baby aspirin.  ECG without acute ischemic changes  and his first troponin is negative.  However with multiple risk factors, his HEART score is a 4.  With this, he will be admitted to the hospitalist service for observation and ACS rule out.  Given the left greater than right swelling of the legs, a d-dimer was sent for otherwise low risk PE work-up and this is negative.  Final Clinical Impressions(s) / ED Diagnoses   Final diagnoses:  Chest pain, moderate coronary artery risk    ED Discharge Orders    None       Sherwood Gambler, MD 05/03/18 1858

## 2018-05-03 NOTE — H&P (Signed)
History and Physical    Evan [name] GUR:[id number] DOB: 1966-05-01 DOA: 05/03/2018  PCP: [name], NP   Patient coming from: Home  Chief Complaint: Chest Pain  HPI: Evan [name] is a 52 y.o. male with medical history significant for HLD, HTN, hx of nephrectomy, presented to the ED today with complaints of chest pain-mostly described as a heaviness, intermittent over the past 2 days worse with activity relieved by rest.  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  Pain radiates to both his jaws, with associated dizziness and shortness of breath.  Family history of CABG in mother age 77, also cardiac disease in grandparents.  Never smoker. Patient reports lower extremities but swelling worse on the left, with slight discoloration, and pain in his calves of 2 weeks duration.  Patient reports a cough over the past 2, mostly dry but occasionally productive of phlegm. Reports he was referred to GI for cough and had an EGD which showed yeast infection for which he was prescribed fluconazole, but was told he needed further eval for his cough.  Patient has been on 10mg  lisinopril for 1 and a half year, dose was increased to 20mg  but patient developed headaches so he went back to precious 10mg  dose.  Patient reports about a week ago he was doing some activities around the house- hanging pictures, suddenly became dizzy nauseous with cold sweats this lasted about 10 to 15 minutes and resolved.  ED Course: Blood pressure systolic 283T to 517 otherwise stable vitals.  I-STAT troponin negative.  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   Review of Systems: Reports nocturia without dysuria over the past few weeks, 2-3 times per night which is new for him, others as per HPI otherwise 10 point review of systems negative.   Past Medical History:  Diagnosis Date  . Arthritis   . Chronic kidney disease   . Colon polyps    benign per pt    . Hyperlipidemia   . Hypertension   . Lyme disease     Past Surgical History:  Procedure Laterality Date  . HERNIA REPAIR     x 2  . NECK SURGERY    . NEPHRECTOMY Left 2010  . SHOULDER SURGERY Left      reports that he has never smoked. He has never used smokeless tobacco. He reports that he drinks alcohol. He reports that he does not use drugs.  Allergies  Allergen Reactions  . Nuvigil [Armodafinil] Other (See Comments)    Seizures   . Seroquel [Quetiapine] Other (See Comments)    Seizures.  . Simvastatin Hives    Family History  Problem Relation Age of Onset  . Arthritis Mother   . Heart disease Mother   . Hypertension Mother   . Kidney disease Mother   . Colon cancer Father        dx in his early 30's  . Lung cancer Father   . Prostate cancer Father   . Breast cancer Father        46 or 29  . Lung cancer Brother        smoker  . Lung cancer Paternal Aunt   . Colon cancer Paternal Uncle   . Diabetes Paternal Uncle   . Arthritis Maternal Grandmother   . Colon cancer Maternal Grandmother   . Heart disease Maternal Grandmother   . Heart disease Maternal Grandfather   . Diabetes Paternal Grandfather   .  Lung cancer Brother        smoker  . Bladder Cancer Brother     Prior to Admission medications   Medication Sig Start Date End Date Taking? Authorizing Provider  aspirin-acetaminophen-caffeine (EXCEDRIN MIGRAINE) [phone] MG tablet Take by mouth every 6 (six) hours as needed for headache.   Yes [provider]  betamethasone dipropionate (DIPROLENE) 0.05 % cream APPLY TO AFFECTED AREA UP TO TWICE A DAY AS NEEDED. DO NOT APPLY TO FACE, GROIN, OR UNDER ARMS. 02/16/18  Yes [provider]  Cholecalciferol (VITAMIN D3) 5000 units CAPS Take 1 capsule by mouth daily.   Yes [provider]  Doxylamine Succinate, Sleep, (SLEEP AID PO) Take by mouth as needed.   Yes [provider]  lisinopril (PRINIVIL,ZESTRIL) 10 MG tablet  Take 1 tablet (10 mg total) by mouth daily. 03/29/18  Yes [name], NP  omeprazole (PRILOSEC) 20 MG capsule Take 20 mg by mouth daily.   Yes [provider]  esomeprazole (NEXIUM) 40 MG capsule Take 1 capsule (40 mg total) by mouth daily with breakfast. Patient not taking: Reported on 05/03/2018 02/10/18   Doran Stabler, MD  fluconazole (DIFLUCAN) 100 MG tablet Take 1 tablet (100 mg total) by mouth daily. Patient not taking: Reported on 05/03/2018 02/25/18   Doran Stabler, MD    Physical Exam: Vitals:   05/03/18 1446 05/03/18 1615 05/03/18 1700 05/03/18 1714  BP:  (!) 148/97 (!) 152/109 (!) 142/89  Pulse:  88 76 80  Resp:   13 18  Temp:      TempSrc:      SpO2:  98% 94% 92%  Weight: 117.9 kg (260 lb)     Height: 5\' 10"  (1.778 m)       Constitutional: NAD, calm, comfortable Vitals:   05/03/18 1446 05/03/18 1615 05/03/18 1700 05/03/18 1714  BP:  (!) 148/97 (!) 152/109 (!) 142/89  Pulse:  88 76 80  Resp:   13 18  Temp:      TempSrc:      SpO2:  98% 94% 92%  Weight: 117.9 kg (260 lb)     Height: 5\' 10"  (1.778 m)      Eyes: PERRL, lids and conjunctivae normal ENMT: Mucous membranes are moist. Posterior pharynx clear of any exudate or lesions. Neck: normal, supple, no masses, no thyromegaly Respiratory: clear to auscultation bilaterally, no wheezing, no crackles. Normal respiratory effort. No accessory muscle use.  Cardiovascular: Regular rate and rhythm, no murmurs / rubs / gallops. 2+ pedal pulses.  Abdomen: no tenderness, no masses palpated. No hepatosplenomegaly. Bowel sounds positive.  Musculoskeletal: no clubbing / cyanosis. No joint deformity upper and lower extremities. Good ROM, no contractures. Normal muscle tone. Trace pitting edema- worse on left, with few petechiae in bilat lower extremities, below calves. Skin: no rashes, lesions, ulcers. No induration Neurologic: CN 2-12 grossly intact. Sensation intact, DTR normal. Strength 5/5 in all 4.   Psychiatric: Normal judgment and insight. Alert and oriented x 3. Normal mood.   Labs on Admission: I have personally reviewed following labs and imaging studies  CBC: Recent Labs  Lab 05/03/18 1523  WBC 9.4  HGB 17.4*  HCT 48.6  MCV 90.7  PLT 371   Basic Metabolic Panel: Recent Labs  Lab 05/03/18 1523  NA 136  K 4.2  CL 106  CO2 22  GLUCOSE 100*  BUN 13  CREATININE 1.07  CALCIUM 9.2   Urine analysis:    Component Value  Date/Time   BILIRUBINUR Negative 06/18/2017 1543   PROTEINUR Negative 06/18/2017 1543   UROBILINOGEN 0.2 06/18/2017 1543   NITRITE Negative 06/18/2017 1543   LEUKOCYTESUR Small (1+) (A) 06/18/2017 1543    Radiological Exams on Admission: Dg Chest 2 View  Result Date: 05/03/2018 CLINICAL DATA:  Intermittent left-sided and central chest pain since yesterday. EXAM: CHEST - 2 VIEW COMPARISON:  None. FINDINGS: The heart size and mediastinal contours are within normal limits. Minimal aortic atherosclerosis at the arch. Minimal subsegmental atelectasis identified at the left lung base. No pulmonary consolidation or CHF. No effusion or pneumothorax. Lower cervical ACDF hardware in place. IMPRESSION: No active cardiopulmonary disease. Electronically Signed   By: Ashley Royalty M.D.   On: 05/03/2018 15:51    EKG: Independently reviewed. 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  No ST or T wave abnormalities.  Assessment/Plan Active Problems:   Essential hypertension   Hyperlipidemia   History of nephrectomy   Chest pain  Chest Pain- Typical. Also high risk for ACS with HLD, Obesity, Family hx of premature CAD, HTN. HEART Score- at least 4.  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  Typical chest pain suggests cardiac etiology rather than thromboembolic embolic phenomena. -Trend troponins x3 -EKG a.m. -Echocardiogram -Recommend cardiology consult a.m. -Lipid panel a.m. -Start Coreg 6.25 mg twice daily -subling Nitro PRN - UDS  Lower extremity swelling- L > R. BNP- 11.7. Pt  obese.  Also with mild bilateral petechial rash.  Platelets 219.  Reports cough pain.  -Left lower extremity ultrasound  HTN- blood pressure systolic 027O- 536U.  Home medication lisinopril 10 mg daily.  2 months of cough mostly dry. -We will stop lisinopril for now, possibly causing dry cough -Start Coreg 6.25 mg twice daily  Nocturia- without dysuria. No fever. WBC- 9.4.  reports prostate bipsy in the past for nodule- benign. - UA  DVT prophylaxis: lovenox Code Status: Full Family Communication: None at bedside Disposition Plan: Per day time rounding team Consults called: None , recommend cardiology consult a.m Admission status: Obs, tele   Bethena Roys MD [facility] Pager [phone] From 6PM-2AM.  Otherwise please contact night-coverage [email or web address] Password [facility]  05/03/2018, 6:25 PM

## 2018-05-03 NOTE — ED Notes (Signed)
Patient still complaining of chest discomfort. This RN will reassess pain after administration of nitroglycerin.

## 2018-05-04 ENCOUNTER — Observation Stay (HOSPITAL_COMMUNITY): Payer: Managed Care, Other (non HMO)

## 2018-05-04 ENCOUNTER — Observation Stay (HOSPITAL_BASED_OUTPATIENT_CLINIC_OR_DEPARTMENT_OTHER): Payer: Managed Care, Other (non HMO)

## 2018-05-04 ENCOUNTER — Encounter (HOSPITAL_COMMUNITY)
Admission: EM | Disposition: A | Discharge status: Home / Self Care | Payer: Self-pay | Source: Home / Self Care | Attending: Emergency Medicine

## 2018-05-04 DIAGNOSIS — R079 Chest pain, unspecified: Secondary | ICD-10-CM

## 2018-05-04 DIAGNOSIS — Z905 Acquired absence of kidney: Secondary | ICD-10-CM | POA: Diagnosis not present

## 2018-05-04 DIAGNOSIS — E785 Hyperlipidemia, unspecified: Secondary | ICD-10-CM

## 2018-05-04 DIAGNOSIS — I10 Essential (primary) hypertension: Secondary | ICD-10-CM

## 2018-05-04 DIAGNOSIS — I200 Unstable angina: Secondary | ICD-10-CM | POA: Diagnosis not present

## 2018-05-04 HISTORY — PX: LEFT HEART CATH AND CORONARY ANGIOGRAPHY: CATH118249

## 2018-05-04 LAB — ECHOCARDIOGRAM COMPLETE
Height: 70 in
Weight: 4480 oz

## 2018-05-04 LAB — LIPID PANEL
CHOL/HDL RATIO: 7.9 ratio
Cholesterol: 260 mg/dL — ABNORMAL HIGH (ref 0–200)
HDL: 33 mg/dL — ABNORMAL LOW (ref >40)
LDL Cholesterol: 165 mg/dL — ABNORMAL HIGH (ref 0–99)
Triglycerides: 309 mg/dL — ABNORMAL HIGH (ref <150)
VLDL: 62 mg/dL — ABNORMAL HIGH (ref 0–40)

## 2018-05-04 LAB — TROPONIN I: Troponin I: <0.03 ng/mL (ref <0.03)

## 2018-05-04 LAB — HIV ANTIBODY (ROUTINE TESTING W REFLEX): HIV Screen 4th Generation wRfx: NONREACTIVE

## 2018-05-04 SURGERY — LEFT HEART CATH AND CORONARY ANGIOGRAPHY
Anesthesia: LOCAL

## 2018-05-04 MED ORDER — ASPIRIN EC 81 MG PO TBEC
81.0000 mg | DELAYED_RELEASE_TABLET | Freq: Every day | ORAL | Status: DC
  Filled 2018-05-04: qty 1

## 2018-05-04 MED ORDER — FENTANYL CITRATE (PF) 100 MCG/2ML IJ SOLN
INTRAMUSCULAR | Status: AC
  Filled 2018-05-04: qty 2

## 2018-05-04 MED ORDER — HEPARIN SODIUM (PORCINE) 1000 UNIT/ML IJ SOLN
INTRAMUSCULAR | Status: DC | PRN
  Administered 2018-05-04: 6000 [IU] via INTRAVENOUS

## 2018-05-04 MED ORDER — LIDOCAINE HCL (PF) 1 % IJ SOLN
INTRAMUSCULAR | Status: AC
  Filled 2018-05-04: qty 30

## 2018-05-04 MED ORDER — HEPARIN (PORCINE) IN NACL 2-0.9 UNITS/ML
INTRAMUSCULAR | Status: AC | PRN
  Administered 2018-05-04 (×2): 500 mL

## 2018-05-04 MED ORDER — MIDAZOLAM HCL 2 MG/2ML IJ SOLN
INTRAMUSCULAR | Status: DC | PRN
  Administered 2018-05-04: 2 mg via INTRAVENOUS

## 2018-05-04 MED ORDER — SODIUM CHLORIDE 0.9% FLUSH: 3.0000 mL | Freq: Two times a day (BID) | INTRAVENOUS | Status: DC

## 2018-05-04 MED ORDER — MIDAZOLAM HCL 2 MG/2ML IJ SOLN
INTRAMUSCULAR | Status: AC
  Filled 2018-05-04: qty 2

## 2018-05-04 MED ORDER — SODIUM CHLORIDE 0.9% FLUSH: 3.0000 mL | INTRAVENOUS | Status: DC | PRN

## 2018-05-04 MED ORDER — HEPARIN (PORCINE) IN NACL 100-0.45 UNIT/ML-% IJ SOLN
1700.0000 [IU]/h | INTRAMUSCULAR | Status: DC
  Administered 2018-05-04: 1700 [IU]/h via INTRAVENOUS
  Filled 2018-05-04: qty 250

## 2018-05-04 MED ORDER — VERAPAMIL HCL 2.5 MG/ML IV SOLN
INTRAVENOUS | Status: DC | PRN
  Administered 2018-05-04: 10 mL via INTRA_ARTERIAL

## 2018-05-04 MED ORDER — ACETAMINOPHEN 325 MG PO TABS: 650.0000 mg | ORAL_TABLET | ORAL | Status: DC | PRN

## 2018-05-04 MED ORDER — VERAPAMIL HCL 2.5 MG/ML IV SOLN
INTRAVENOUS | Status: AC
  Filled 2018-05-04: qty 2

## 2018-05-04 MED ORDER — ASPIRIN 81 MG PO TBEC: 81.0000 mg | DELAYED_RELEASE_TABLET | Freq: Every day | ORAL | 3 refills | Status: DC

## 2018-05-04 MED ORDER — FENTANYL CITRATE (PF) 100 MCG/2ML IJ SOLN
INTRAMUSCULAR | Status: DC | PRN
  Administered 2018-05-04: 50 ug via INTRAVENOUS

## 2018-05-04 MED ORDER — DIPHENHYDRAMINE HCL 50 MG/ML IJ SOLN
25.0000 mg | Freq: Once | INTRAMUSCULAR | Status: AC
  Administered 2018-05-04: 25 mg via INTRAVENOUS

## 2018-05-04 MED ORDER — ASPIRIN 81 MG PO CHEW
324.0000 mg | CHEWABLE_TABLET | ORAL | Status: AC
  Administered 2018-05-04: 324 mg via ORAL
  Filled 2018-05-04: qty 4

## 2018-05-04 MED ORDER — IOHEXOL 350 MG/ML SOLN
INTRAVENOUS | Status: DC | PRN
  Administered 2018-05-04: 65 mL via INTRAVENOUS

## 2018-05-04 MED ORDER — SODIUM CHLORIDE 0.9 % IV SOLN: 250.0000 mL | INTRAVENOUS | Status: DC | PRN

## 2018-05-04 MED ORDER — METOPROLOL TARTRATE 25 MG PO TABS: 25.0000 mg | ORAL_TABLET | Freq: Two times a day (BID) | ORAL | 3 refills | Status: DC

## 2018-05-04 MED ORDER — HEPARIN (PORCINE) IN NACL 1000-0.9 UT/500ML-% IV SOLN
INTRAVENOUS | Status: AC
  Filled 2018-05-04: qty 1000

## 2018-05-04 MED ORDER — OMEPRAZOLE 20 MG PO CPDR: 20.0000 mg | DELAYED_RELEASE_CAPSULE | Freq: Every day | ORAL | 3 refills | Status: AC

## 2018-05-04 MED ORDER — DIPHENHYDRAMINE HCL 50 MG/ML IJ SOLN
INTRAMUSCULAR | Status: AC
  Filled 2018-05-04: qty 1

## 2018-05-04 MED ORDER — METOPROLOL TARTRATE 25 MG PO TABS
25.0000 mg | ORAL_TABLET | Freq: Two times a day (BID) | ORAL | Status: DC
  Administered 2018-05-04: 25 mg via ORAL
  Filled 2018-05-04: qty 1

## 2018-05-04 MED ORDER — HEPARIN BOLUS VIA INFUSION
4000.0000 [IU] | Freq: Once | INTRAVENOUS | Status: AC
  Administered 2018-05-04: 4000 [IU] via INTRAVENOUS
  Filled 2018-05-04: qty 4000

## 2018-05-04 MED ORDER — HEPARIN SODIUM (PORCINE) 1000 UNIT/ML IJ SOLN
INTRAMUSCULAR | Status: AC
  Filled 2018-05-04: qty 1

## 2018-05-04 MED ORDER — SODIUM CHLORIDE 0.9 % IV SOLN: INTRAVENOUS | Status: DC

## 2018-05-04 MED ORDER — METOPROLOL TARTRATE 25 MG PO TABS: 12.5000 mg | ORAL_TABLET | Freq: Two times a day (BID) | ORAL | Status: DC

## 2018-05-04 MED ORDER — LIDOCAINE HCL (PF) 1 % IJ SOLN
INTRAMUSCULAR | Status: DC | PRN
  Administered 2018-05-04: 2 mL

## 2018-05-04 SURGICAL SUPPLY — 9 items
CATH OPTITORQUE TIG 4.0 5F (CATHETERS) ×2 IMPLANT
DEVICE RAD COMP TR BAND LRG (VASCULAR PRODUCTS) ×2 IMPLANT
GLIDESHEATH SLEND A-KIT 6F 22G (SHEATH) ×2 IMPLANT
GUIDEWIRE INQWIRE 1.5J.035X260 (WIRE) ×1 IMPLANT
INQWIRE 1.5J .035X260CM (WIRE) ×2
KIT HEART LEFT (KITS) ×2 IMPLANT
PACK CARDIAC CATHETERIZATION (CUSTOM PROCEDURE TRAY) ×2 IMPLANT
TRANSDUCER W/STOPCOCK (MISCELLANEOUS) ×2 IMPLANT
TUBING CIL FLEX 10 FLL-RA (TUBING) ×2 IMPLANT

## 2018-05-04 NOTE — Consult Note (Addendum)
Cardiology Consultation:   Patient ID: Evan [name]; [id number]; 1966/10/26   Admit date: 05/03/2018 Date of Consult: 05/04/2018  Primary Care Provider: [name], NP Primary Cardiologist: Dr. Burt Knack (New) Primary Electrophysiologist:  N/A  Patient Profile:   Evan [name] is a 52 y.o. male with a hx of HTN, HLD, family h/o premature CAD, obesity, GERD/ esophagitis and solitary kidney s/p left nephrectomy in 2010, who is being seen today for the evaluation of chest pain, at the request of Dr. Loleta Books, Internal Medicine.  History of Present Illness:   Evan [name] is a 52 y/o male with no prior diagnosis of heart disease, being evaluated for chest pain. His cardiac risk factors include a family h/o premature CAD in first degree relatives (mother w/ CABG at age 68, also maternal grandfather who died of SCD/MI at age 35), HTN and HLD. He has high LDL at 165 mg/dL and low HDL at 33 mg/dL. TG also markedly elevated at 309 mg/dL and total cholesterol is 260 mg/dL. He is currently not on any lipid lowering agents. He denies tobacco use. No h/o DM. He has a solitary kidney and underwent left nephrectomy in 2010 (secondary to atrophy, recurrent hydronephrosis and HTN). He works as a Freight forwarder at [employer] He recently moved to [place] a little over a year ago from [place], [place]. He is married w/ 2 children in the [employer]. He has established care with a PCP since moving to [place] but has not yet established care with a new nephrologist. Given his recent symptoms, he called our office for a new pt appt (Dr. Marlou Porch on 6/12).    He also has recent diagnosis of GERD and esophagitis. He was seen for "reflux" and cough. He underwent recent EGD 02/2018 and was noted to have normal larynx, slightly raised, white plaques mucosa in the esophagus, biopsied. Normal stomach. Normal examined duodenum. Biopsies of white plaques on esophagus confirmed yeast. He was prescribed Fluconazole 100 mg tablet x 14 days. He was also  prescribed Nexium for heartburn w/ plans to f/u with GI in 6 weeks. Pt notes that his "reflux" improved after starting PPI.   Pt presented to the [facility] ED on 05/03/18 with complaint of exertional chest pain, exertional dyspnea, fatigue and decreased exercise tolerance. Symptoms first started as exertional dyspnea and fatigue, ~ 2 months ago. He reports feeling tired and giving out at work and with basic ADLs. His CP started 2 days ago. Described as substernal heaviness with radiation to his jaws, bilaterally. Exertional and improved with rest. Also with associated dizziness. He also reported chronic dry cough. He takes lisinopril at home for HTN. In the ED, he was given SL NTG and chest pain resolved.   Pt has been admitted by Internal Medicine. EKG shows NSR with LAD. D-dimer is negative.  Troponins are negative x 3. BNP normal at 11.7. CBC and BMP unremarkable. SCr 1.07 w/ CrCl > 60 ml/min.  Lipid panel per above. UDS negative. CXR negative. He is currently CP free.    Past Medical History:  Diagnosis Date  . Arthritis   . Chronic kidney disease   . Colon polyps    benign per pt  . Hyperlipidemia   . Hypertension   . Lyme disease     Past Surgical History:  Procedure Laterality Date  . HERNIA REPAIR     x 2  . NECK SURGERY    . NEPHRECTOMY Left 2010  . SHOULDER SURGERY Left      Home Medications:  Prior to Admission medications   Medication Sig Start Date End Date Taking? Authorizing Provider  aspirin-acetaminophen-caffeine (EXCEDRIN MIGRAINE) [phone] MG tablet Take by mouth every 6 (six) hours as needed for headache.   Yes [provider]  betamethasone dipropionate (DIPROLENE) 0.05 % cream APPLY TO AFFECTED AREA UP TO TWICE A DAY AS NEEDED. DO NOT APPLY TO FACE, GROIN, OR UNDER ARMS. 02/16/18  Yes [provider]  Cholecalciferol (VITAMIN D3) 5000 units CAPS Take 1 capsule by mouth daily.   Yes [provider]  Doxylamine Succinate, Sleep, (SLEEP AID PO)  Take by mouth as needed.   Yes [provider]  lisinopril (PRINIVIL,ZESTRIL) 10 MG tablet Take 1 tablet (10 mg total) by mouth daily. 03/29/18  Yes [name], NP  omeprazole (PRILOSEC) 20 MG capsule Take 20 mg by mouth daily.   Yes [provider]  esomeprazole (NEXIUM) 40 MG capsule Take 1 capsule (40 mg total) by mouth daily with breakfast. Patient not taking: Reported on 05/03/2018 02/10/18   Doran Stabler, MD  fluconazole (DIFLUCAN) 100 MG tablet Take 1 tablet (100 mg total) by mouth daily. Patient not taking: Reported on 05/03/2018 02/25/18   Doran Stabler, MD    Inpatient Medications: Scheduled Meds: . carvedilol  6.25 mg Oral BID WC  . enoxaparin (LOVENOX) injection  40 mg Subcutaneous Q24H  . pantoprazole  40 mg Oral Daily   Continuous Infusions:  PRN Meds: nitroGLYCERIN, ondansetron **OR** ondansetron (ZOFRAN) IV  Allergies:    Allergies  Allergen Reactions  . Nuvigil [Armodafinil] Other (See Comments)    Seizures   . Seroquel [Quetiapine] Other (See Comments)    Seizures.  . Simvastatin Hives    Social History:   Social History   Socioeconomic History  . Marital status: Married    Spouse name: Not on file  . Number of children: 4  . Years of education: Not on file  . Highest education level: Not on file  Occupational History  . Occupation: Freight forwarder  Social Needs  . Financial resource strain: Not on file  . Food insecurity:    Worry: Not on file    Inability: Not on file  . Transportation needs:    Medical: Not on file    Non-medical: Not on file  Tobacco Use  . Smoking status: Never Smoker  . Smokeless tobacco: Never Used  Substance and Sexual Activity  . Alcohol use: Yes    Comment: occasional  . Drug use: No  . Sexual activity: Not on file  Lifestyle  . Physical activity:    Days per week: Not on file    Minutes per session: Not on file  . Stress: Not on file  Relationships  . Social connections:    Talks on  phone: Not on file    Gets together: Not on file    Attends religious service: Not on file    Active member of club or organization: Not on file    Attends meetings of clubs or organizations: Not on file    Relationship status: Not on file  . Intimate partner violence:    Fear of current or ex partner: Not on file    Emotionally abused: Not on file    Physically abused: Not on file    Forced sexual activity: Not on file  Other Topics Concern  . Not on file  Social History Narrative  . Not on file    Family History:    Family  History  Problem Relation Age of Onset  . Arthritis Mother   . Heart disease Mother   . Hypertension Mother   . Kidney disease Mother   . Colon cancer Father        dx in his early 20's  . Lung cancer Father   . Prostate cancer Father   . Breast cancer Father        64 or 34  . Lung cancer Brother        smoker  . Lung cancer Paternal Aunt   . Colon cancer Paternal Uncle   . Diabetes Paternal Uncle   . Arthritis Maternal Grandmother   . Colon cancer Maternal Grandmother   . Heart disease Maternal Grandmother   . Heart disease Maternal Grandfather   . Diabetes Paternal Grandfather   . Lung cancer Brother        smoker  . Bladder Cancer Brother      ROS:  Please see the history of present illness.   All other ROS reviewed and negative.     Physical Exam/Data:   Vitals:   05/03/18 2130 05/04/18 0020 05/04/18 0040 05/04/18 0545  BP: (!) 147/99   131/85  Pulse: 88 79 81 73  Resp: 16   18  Temp: 98.2 F (36.8 C)   (!) 97.5 F (36.4 C)  TempSrc: Oral   Oral  SpO2: 95% 98% 95% 95%  Weight: 280 lb (127 kg)     Height: 5\' 10"  (1.778 m)       Intake/Output Summary (Last 24 hours) at 05/04/2018 0915 Last data filed at 05/04/2018 0556 Gross per 24 hour  Intake -  Output 600 ml  Net -600 ml   Filed Weights   05/03/18 1446 05/03/18 2130  Weight: 260 lb (117.9 kg) 280 lb (127 kg)   Body mass index is 40.18 kg/m.  General:  Well  nourished, well developed, in no acute distress, moderately obese  HEENT: normal Lymph: no adenopathy Neck: no JVD Endocrine:  No thryomegaly Vascular: No carotid bruits; FA pulses 2+ bilaterally without bruits  Cardiac:  normal S1, S2; RRR; no murmur  Lungs:  clear to auscultation bilaterally, no wheezing, rhonchi or rales  Abd: soft, nontender, no hepatomegaly  Ext: no edema Musculoskeletal:  No deformities, BUE and BLE strength normal and equal Skin: warm and dry  Neuro:  CNs 2-12 intact, no focal abnormalities noted Psych:  Normal affect   EKG:  The EKG was personally reviewed and demonstrates:  NSR LAD Telemetry:  Telemetry was personally reviewed and demonstrates:  NSR    Relevant CV Studies: 2D echo pending   Laboratory Data:  Chemistry Recent Labs  Lab 05/03/18 1523  NA 136  K 4.2  CL 106  CO2 22  GLUCOSE 100*  BUN 13  CREATININE 1.07  CALCIUM 9.2  GFRNONAA >60  GFRAA >60  ANIONGAP 8    No results for input(s): PROT, ALBUMIN, AST, ALT, ALKPHOS, BILITOT in the last 168 hours. Hematology Recent Labs  Lab 05/03/18 1523  WBC 9.4  RBC 5.36  HGB 17.4*  HCT 48.6  MCV 90.7  MCH 32.5  MCHC 35.8  RDW 13.1  PLT 270   Cardiac Enzymes Recent Labs  Lab 05/03/18 2055 05/03/18 2152 05/04/18 0244  TROPONINI <0.03 <0.03 <0.03    Recent Labs  Lab 05/03/18 1529  TROPIPOC 0.00    BNP Recent Labs  Lab 05/03/18 1634  BNP 11.7    DDimer  Recent Labs  Lab  05/03/18 1634  DDIMER 0.29    Radiology/Studies:  Dg Chest 2 View  Result Date: 05/03/2018 CLINICAL DATA:  Intermittent left-sided and central chest pain since yesterday. EXAM: CHEST - 2 VIEW COMPARISON:  None. FINDINGS: The heart size and mediastinal contours are within normal limits. Minimal aortic atherosclerosis at the arch. Minimal subsegmental atelectasis identified at the left lung base. No pulmonary consolidation or CHF. No effusion or pneumothorax. Lower cervical ACDF hardware in place.  IMPRESSION: No active cardiopulmonary disease. Electronically Signed   By: Ashley Royalty M.D.   On: 05/03/2018 15:51    Assessment and Plan:   Andrus Sharp is a 52 y.o. male with a hx of HTN, HLD, family h/o premature CAD, obesity, GERD/ esophagitis and solitary kidney s/p left nephrectomy in 2010, who is being seen today for the evaluation of chest pain, at the request of Dr. Loleta Books, Internal Medicine.  1. Chest Pain/ Unstable Angina: Currently CP free. EKG shows NSR with LAD. D-dimer is negative.  Troponins are negative x 3. 2D echo pending. He has several cardiac risk factors including family h/o premature CAD in 1st degree relatives, untreated HLD with high LDL at 165 mg/dL and HTN. He has recent diagnosis of GERD and esophageal candidiasis, however he has also had recent exertional CP, dyspnea, fatigue and decreased exercise tolerance concerning for cardiac etiology.  He would benefit from definitive cardiac catheterization, however he has a solitary kidney, s/p left nephrectomy in 2010. His CrCl is > 60 mL/min and SCr is 1.07. Dr. Burt Knack to evaluate and will determine course of w/u. In the mean time, recommend continuation of BB and addition of statin + ASA.    2. HLD: He has high LDL at 165 mg/dL and low HDL at 33 mg/dL. TG also markedly elevated at 309 mg/dL and total cholesterol is 260 mg/dL. Cardiac risk factors include family h/o premature CAD, HTN and male sex >60 y/o. Recommend initiation of statin therapy with goal LDL <70 mg/dL. Recommend Lipitor 40 mg nightly. Repeat FLP and check HFTs in 6 weeks.   3. HTN: takes lisinopril 10 mg at home. Not prescribed on admit due to complaints of chronic nonproductive cough, felt possibly related to ACE-I. Beta blocker initiated this admit. Currently getting Coreg, 6.25 mg BID but had some side effects after both night and morning dose (throat tightness, relieved with Benadryl). I've spoken with primary MD. Plan is to stop Coreg and try another agent.  Can consider metoprolol or Losartan.   4. GERD: on Nexium as outpatient, prescribed by GI. Currently getting Protonix while inpatient.   5. H/o Esophagitis: candida confirmed by biopsies obtained by recent EGD 02/2018. He was prescribed Fluconazole 100 mg tablet x 14 days. Does note some difficulty swallowing/ fullness in throat. Further management per GI.   6. Solitary Kidney: s/p left nephrectomy in 2010 (secondary to atrophy, recurrent hydronephrosis and HTN). SCr 1.07 and CrCl > 60 ml/min. He has not established care with a nephrologist since his move to [facility]. He reports that he has had studies that have required contrast since his nephrectomy and did ok w/o contrast induced nephropathy. If cardiac cath is planned, he will need pre cath hydration and close monitoring of renal function. His home lisinopril has been discontinued, but primary due to chronic dry cough.   MD to follow with further recommendations.   For questions or updates, please contact [place] Please consult [email or web address] for contact info under Cardiology/STEMI.   Signed, Lyda Jester, PA-C  05/04/2018 9:15  AM   Patient seen, examined. Available data reviewed. Agree with findings, assessment, and plan as outlined by Lyda Jester, PA-C.  The patient is independently interviewed and examined.  He reports a history that is highly suggestive of accelerating angina.  He has a high risk profile with strong family history of coronary artery disease as his mother was treated with CABG at age 45.  The patient also has obesity and untreated hyperlipidemia.  Objective markers are negative to date with normal troponins and a nonischemic EKG.  On my exam.  He is a pleasant obese male in no distress.  JVP is normal, carotids are normal with no bruits.  Lungs are clear.  Heart is regular rate and rhythm with no murmur or gallop.  Abdomen is soft and nontender with no abdominal bruits.  Extremities show trace pretibial edema.  Skin  is warm and dry with no rash.  Neurologic exam is nonfocal with normal strength bilaterally in the upper and lower extremities.  The patient's history and risk factors are highly suggestive of acute coronary syndrome.  He will be started on IV heparin via a bolus and drip.  He should continue aspirin.  I have recommended definitive evaluation with cardiac catheterization and possible angioplasty/stenting.  I have reviewed the risks, indications, and alternatives to cardiac catheterization, possible angioplasty, and stenting with the patient. Risks include but are not limited to bleeding, infection, vascular injury, stroke, myocardial infection, arrhythmia, kidney injury, radiation-related injury in the case of prolonged fluoroscopy use, emergency cardiac surgery, and death. The patient understands the risks of serious complication is 1-2 in 2355 with diagnostic cardiac cath and 1-2% or less with angioplasty/stenting.  The patient will be added on to the cardiac catheterization scheduled for today.  He understands the plan and agrees to proceed.  While he has a solitary kidney, his renal function is intact and I think he is at low risk of contrast nephropathy.  We will give him aspirin 324 mg x 1 now and start him on 81 mg daily.  We will start him on metoprolol 25 mg twice daily.  He has had hives with simvastatin and appears to have a true allergy so we will hold off on statin therapy at this time.  [name], M.D. 05/04/2018 12:01 PM

## 2018-05-04 NOTE — Progress Notes (Signed)
Pt. Reported to this RN that every time he fell asleep, he would wake up with difficulty breathing and felt like he "had a lump in his throat." Pt. Assessed and airway was clear, O2 sat at 98% on room air with HR of 79. Pt. States he only feels like his airway is closed when swallowing. Pt. Concerned he is having an allergic reaction to the carvedilol given to him at bedtime. This was the first time taking this medicine and has had reactions to simvastatin in the past. On call NP Baltazar Najjar paged and made aware. Will continue to monitor closely and will carry out any new orders.

## 2018-05-04 NOTE — H&P (View-Only) (Signed)
Cardiology Consultation:   [name] ID: [name]; [id number]; [birth year]   Admit date: 05/03/2018 Date of Consult: 05/04/2018  Primary Care Provider: [name], NP Primary Cardiologist: Dr. Burt Knack (New) Primary Electrophysiologist:  N/A  [name] Profile:   [name] is a 52 y.o. male with a hx of HTN, HLD, family h/o premature CAD, obesity, GERD/ esophagitis and solitary kidney s/p left nephrectomy in 2010, who is being seen today for Evan evaluation of chest pain, at Evan request of Dr. Loleta Books, Internal Medicine.  History of Present Illness:   [name] is a 52 y/o male with no prior diagnosis of heart disease, being evaluated for chest pain. His cardiac risk factors include a family h/o premature CAD in first degree relatives (mother w/ CABG at age 71, also maternal grandfather who died of SCD/MI at age 60), HTN and HLD. He has high LDL at 165 mg/dL and low HDL at 33 mg/dL. TG also markedly elevated at 309 mg/dL and total cholesterol is 260 mg/dL. He is currently not on any lipid lowering agents. He denies tobacco use. No h/o DM. He has a solitary kidney and underwent left nephrectomy in 2010 (secondary to atrophy, recurrent hydronephrosis and HTN). He works as a Freight forwarder at [employer] He recently moved to [place] a little over a year ago from [place], [place]. He is married w/ 2 children in [place]. He has established care with a PCP since moving to [place] but has not yet established care with a new nephrologist. Given his recent symptoms, he called our office for a new pt appt (Dr. Marlou Porch on 6/12).    He also has recent diagnosis of GERD and esophagitis. He was seen for "reflux" and cough. He underwent recent EGD 02/2018 and was noted to have normal larynx, slightly raised, white plaques mucosa in Evan esophagus, biopsied. Normal stomach. Normal examined duodenum. Biopsies of white plaques on esophagus confirmed yeast. He was prescribed Fluconazole 100 mg tablet x 14 days. He was also  prescribed Nexium for heartburn w/ plans to f/u with GI in 6 weeks. Pt notes that his "reflux" improved after starting PPI.   Pt presented to [place] [facility] ED on 05/03/18 with complaint of exertional chest pain, exertional dyspnea, fatigue and decreased exercise tolerance. Symptoms first started as exertional dyspnea and fatigue, ~ 2 months ago. He reports feeling tired and giving out at work and with basic ADLs. His CP started 2 days ago. Described as substernal heaviness with radiation to his jaws, bilaterally. Exertional and improved with rest. Also with associated dizziness. He also reported chronic dry cough. He takes lisinopril at home for HTN. In [place] ED, he was given SL NTG and chest pain resolved.   Pt has been admitted by Internal Medicine. EKG shows NSR with LAD. D-dimer is negative.  Troponins are negative x 3. BNP normal at 11.7. CBC and BMP unremarkable. SCr 1.07 w/ CrCl > 60 ml/min.  Lipid panel per above. UDS negative. CXR negative. He is currently CP free.    Past Medical History:  Diagnosis Date  . Arthritis   . Chronic kidney disease   . Colon polyps    benign per pt  . Hyperlipidemia   . Hypertension   . Lyme disease     Past Surgical History:  Procedure Laterality Date  . HERNIA REPAIR     x 2  . NECK SURGERY    . NEPHRECTOMY Left 2010  . SHOULDER SURGERY Left      Home Medications:  Prior to Admission medications   Medication Sig Start Date End Date Taking? Authorizing Provider  aspirin-acetaminophen-caffeine (EXCEDRIN MIGRAINE) [phone] MG tablet Take by mouth every 6 (six) hours as needed for headache.   Yes [provider]  betamethasone dipropionate (DIPROLENE) 0.05 % cream APPLY TO AFFECTED AREA UP TO TWICE A DAY AS NEEDED. DO NOT APPLY TO FACE, GROIN, OR UNDER ARMS. 02/16/18  Yes [provider]  Cholecalciferol (VITAMIN D3) 5000 units CAPS Take 1 capsule by mouth daily.   Yes [provider]  Doxylamine Succinate, Sleep, (SLEEP AID PO)  Take by mouth as needed.   Yes [provider]  lisinopril (PRINIVIL,ZESTRIL) 10 MG tablet Take 1 tablet (10 mg total) by mouth daily. 03/29/18  Yes [place] Koch, NP  omeprazole (PRILOSEC) 20 MG capsule Take 20 mg by mouth daily.   Yes [provider]  esomeprazole (NEXIUM) 40 MG capsule Take 1 capsule (40 mg total) by mouth daily with breakfast. [name] not taking: Reported on 05/03/2018 02/10/18   Doran Stabler, MD  fluconazole (DIFLUCAN) 100 MG tablet Take 1 tablet (100 mg total) by mouth daily. [name] not taking: Reported on 05/03/2018 02/25/18   Doran Stabler, MD    Inpatient Medications: Scheduled Meds: . carvedilol  6.25 mg Oral BID WC  . enoxaparin (LOVENOX) injection  40 mg Subcutaneous Q24H  . pantoprazole  40 mg Oral Daily   Continuous Infusions:  PRN Meds: nitroGLYCERIN, ondansetron **OR** ondansetron (ZOFRAN) IV  Allergies:    Allergies  Allergen Reactions  . Nuvigil [Armodafinil] Other (See Comments)    Seizures   . Seroquel [Quetiapine] Other (See Comments)    Seizures.  . Simvastatin Hives    Social History:   Social History   Socioeconomic History  . Marital status: Married    Spouse name: Not on file  . Number of children: 4  . Years of education: Not on file  . Highest education level: Not on file  Occupational History  . Occupation: Freight forwarder  Social Needs  . Financial resource strain: Not on file  . Food insecurity:    Worry: Not on file    Inability: Not on file  . Transportation needs:    Medical: Not on file    Non-medical: Not on file  Tobacco Use  . Smoking status: Never Smoker  . Smokeless tobacco: Never Used  Substance and Sexual Activity  . Alcohol use: Yes    Comment: occasional  . Drug use: No  . Sexual activity: Not on file  Lifestyle  . Physical activity:    Days per week: Not on file    Minutes per session: Not on file  . Stress: Not on file  Relationships  . Social connections:    Talks on  phone: Not on file    Gets together: Not on file    Attends religious service: Not on file    Active member of club or organization: Not on file    Attends meetings of clubs or organizations: Not on file    Relationship status: Not on file  . Intimate partner violence:    Fear of current or ex partner: Not on file    Emotionally abused: Not on file    Physically abused: Not on file    Forced sexual activity: Not on file  Other Topics Concern  . Not on file  Social History Narrative  . Not on file    Family History:    Family  History  Problem Relation Age of Onset  . Arthritis Mother   . Heart disease Mother   . Hypertension Mother   . Kidney disease Mother   . Colon cancer Father        dx in his early 74's  . Lung cancer Father   . Prostate cancer Father   . Breast cancer Father        15 or 73  . Lung cancer Brother        smoker  . Lung cancer Paternal Aunt   . Colon cancer Paternal Uncle   . Diabetes Paternal Uncle   . Arthritis Maternal Grandmother   . Colon cancer Maternal Grandmother   . Heart disease Maternal Grandmother   . Heart disease Maternal Grandfather   . Diabetes Paternal Grandfather   . Lung cancer Brother        smoker  . Bladder Cancer Brother      ROS:  Please see Evan history of present illness.   All other ROS reviewed and negative.     Physical Exam/Data:   Vitals:   05/03/18 2130 05/04/18 0020 05/04/18 0040 05/04/18 0545  BP: (!) 147/99   131/85  Pulse: 88 79 81 73  Resp: 16   18  Temp: 98.2 F (36.8 C)   (!) 97.5 F (36.4 C)  TempSrc: Oral   Oral  SpO2: 95% 98% 95% 95%  Weight: 280 lb (127 kg)     Height: 5\' 10"  (1.778 m)       Intake/Output Summary (Last 24 hours) at 05/04/2018 0915 Last data filed at 05/04/2018 0556 Gross per 24 hour  Intake -  Output 600 ml  Net -600 ml   Filed Weights   05/03/18 1446 05/03/18 2130  Weight: 260 lb (117.9 kg) 280 lb (127 kg)   Body mass index is 40.18 kg/m.  General:  Well  nourished, well developed, in no acute distress, moderately obese  HEENT: normal Lymph: no adenopathy Neck: no JVD Endocrine:  No thryomegaly Vascular: No carotid bruits; FA pulses 2+ bilaterally without bruits  Cardiac:  normal S1, S2; RRR; no murmur  Lungs:  clear to auscultation bilaterally, no wheezing, rhonchi or rales  Abd: soft, nontender, no hepatomegaly  Ext: no edema Musculoskeletal:  No deformities, BUE and BLE strength normal and equal Skin: warm and dry  Neuro:  CNs 2-12 intact, no focal abnormalities noted Psych:  Normal affect   EKG:  [place] EKG was personally reviewed and demonstrates:  NSR LAD Telemetry:  Telemetry was personally reviewed and demonstrates:  NSR    Relevant CV Studies: 2D echo pending   Laboratory Data:  Chemistry Recent Labs  Lab 05/03/18 1523  NA 136  K 4.2  CL 106  CO2 22  GLUCOSE 100*  BUN 13  CREATININE 1.07  CALCIUM 9.2  GFRNONAA >60  GFRAA >60  ANIONGAP 8    No results for input(s): PROT, ALBUMIN, AST, ALT, ALKPHOS, BILITOT in [place] last 168 hours. Hematology Recent Labs  Lab 05/03/18 1523  WBC 9.4  RBC 5.36  HGB 17.4*  HCT 48.6  MCV 90.7  MCH 32.5  MCHC 35.8  RDW 13.1  PLT 270   Cardiac Enzymes Recent Labs  Lab 05/03/18 2055 05/03/18 2152 05/04/18 0244  TROPONINI <0.03 <0.03 <0.03    Recent Labs  Lab 05/03/18 1529  TROPIPOC 0.00    BNP Recent Labs  Lab 05/03/18 1634  BNP 11.7    DDimer  Recent Labs  Lab  05/03/18 1634  DDIMER 0.29    Radiology/Studies:  Dg Chest 2 View  Result Date: 05/03/2018 CLINICAL DATA:  Intermittent left-sided and central chest pain since yesterday. EXAM: CHEST - 2 VIEW COMPARISON:  None. FINDINGS: Evan heart size and mediastinal contours are within normal limits. Minimal aortic atherosclerosis at Evan arch. Minimal subsegmental atelectasis identified at Evan left lung base. No pulmonary consolidation or CHF. No effusion or pneumothorax. Lower cervical ACDF hardware in place.  IMPRESSION: No active cardiopulmonary disease. Electronically Signed   By: Ashley Royalty M.D.   On: 05/03/2018 15:51    Assessment and Plan:   [name] is a 52 y.o. male with a hx of HTN, HLD, family h/o premature CAD, obesity, GERD/ esophagitis and solitary kidney s/p left nephrectomy in 2010, who is being seen today for Evan evaluation of chest pain, at [place] request of Dr. Loleta Books, Internal Medicine.  1. Chest Pain/ Unstable Angina: Currently CP free. EKG shows NSR with LAD. D-dimer is negative.  Troponins are negative x 3. 2D echo pending. He has several cardiac risk factors including family h/o premature CAD in 1st degree relatives, untreated HLD with high LDL at 165 mg/dL and HTN. He has recent diagnosis of GERD and esophageal candidiasis, however he has also had recent exertional CP, dyspnea, fatigue and decreased exercise tolerance concerning for cardiac etiology.  He would benefit from definitive cardiac catheterization, however he has a solitary kidney, s/p left nephrectomy in 2010. His CrCl is > 60 mL/min and SCr is 1.07. Dr. Burt Knack to evaluate and will determine course of w/u. In Evan mean time, recommend continuation of BB and addition of statin + ASA.    2. HLD: He has high LDL at 165 mg/dL and low HDL at 33 mg/dL. TG also markedly elevated at 309 mg/dL and total cholesterol is 260 mg/dL. Cardiac risk factors include family h/o premature CAD, HTN and male sex >24 y/o. Recommend initiation of statin therapy with goal LDL <70 mg/dL. Recommend Lipitor 40 mg nightly. Repeat FLP and check HFTs in 6 weeks.   3. HTN: takes lisinopril 10 mg at home. Not prescribed on admit due to complaints of chronic nonproductive cough, felt possibly related to ACE-I. Beta blocker initiated this admit. Currently getting Coreg, 6.25 mg BID but had some side effects after both night and morning dose (throat tightness, relieved with Benadryl). I've spoken with primary MD. Plan is to stop Coreg and try another agent.  Can consider metoprolol or Losartan.   4. GERD: on Nexium as outpatient, prescribed by GI. Currently getting Protonix while inpatient.   5. H/o Esophagitis: candida confirmed by biopsies obtained by recent EGD 02/2018. He was prescribed Fluconazole 100 mg tablet x 14 days. Does note some difficulty swallowing/ fullness in throat. Further management per GI.   6. Solitary Kidney: s/p left nephrectomy in 2010 (secondary to atrophy, recurrent hydronephrosis and HTN). SCr 1.07 and CrCl > 60 ml/min. He has not established care with a nephrologist since his move to [facility]. He reports that he has had studies that have required contrast since his nephrectomy and did ok w/o contrast induced nephropathy. If cardiac cath is planned, he will need pre cath hydration and close monitoring of renal function. His home lisinopril has been discontinued, but primary due to chronic dry cough.   MD to follow with further recommendations.   For questions or updates, please contact [place] Please consult [email or web address] for contact info under Cardiology/STEMI.   Signed, Lyda Jester, PA-C  05/04/2018 9:15  AM   [name] seen, examined. Available data reviewed. Agree with findings, assessment, and plan as outlined by Lyda Jester, PA-C.  [name] is independently interviewed and examined.  He reports a history that is highly suggestive of accelerating angina.  He has a high risk profile with strong family history of coronary artery disease as his mother was treated with CABG at age 67.  [name] also has obesity and untreated hyperlipidemia.  Objective markers are negative to date with normal troponins and a nonischemic EKG.  On my exam.  He is a pleasant obese male in no distress.  JVP is normal, carotids are normal with no bruits.  Lungs are clear.  Heart is regular rate and rhythm with no murmur or gallop.  Abdomen is soft and nontender with no abdominal bruits.  Extremities show trace pretibial edema.  Skin  is warm and dry with no rash.  Neurologic exam is nonfocal with normal strength bilaterally in Evan upper and lower extremities.  [name]'s history and risk factors are highly suggestive of acute coronary syndrome.  He will be started on IV heparin via a bolus and drip.  He should continue aspirin.  I have recommended definitive evaluation with cardiac catheterization and possible angioplasty/stenting.  I have reviewed Evan risks, indications, and alternatives to cardiac catheterization, possible angioplasty, and stenting with [name]. Risks include but are not limited to bleeding, infection, vascular injury, stroke, myocardial infection, arrhythmia, kidney injury, radiation-related injury in Evan case of prolonged fluoroscopy use, emergency cardiac surgery, and death. [name] understands Evan risks of serious complication is 1-2 in 7741 with diagnostic cardiac cath and 1-2% or less with angioplasty/stenting.  [name] will be added on to Evan cardiac catheterization scheduled for today.  He understands Evan plan and agrees to proceed.  While he has a solitary kidney, his renal function is intact and I think he is at low risk of contrast nephropathy.  We will give him aspirin 324 mg x 1 now and start him on 81 mg daily.  We will start him on metoprolol 25 mg twice daily.  He has had hives with simvastatin and appears to have a true allergy so we will hold off on statin therapy at this time.  [name], M.D. 05/04/2018 12:01 PM

## 2018-05-04 NOTE — Discharge Summary (Signed)
Physician Discharge Summary  [name] AYT:[id number] DOB: [birth year] DOA: 05/03/2018  PCP: [name], NP  Admit date: 05/03/2018 Discharge date: 05/04/2018  Admitted From: Home  Disposition:  Home   Recommendations for Outpatient Follow-up:  1. Follow up with PCP in 1 week 2. Please follow up BP in 1 week and adjust BP meds as needed 3. Please follow up response to increased dose of omeprazole   Home Health: None  Equipment/Devices: None  Discharge Condition: Good  CODE STATUS: FULL Diet recommendation: Regular  Brief/Interim Summary: [name] is a 52 y.o. M with HTN, GERD, hx nephrectomy (from "atrophy from 'reflux'") who presents with accelerating chest pain and feeling out of breath with exertion over the last few weeks.    Chest pain, noncardiac Dyspnea on exertion Patient had non-ischemic ECG and serial negative troponins.  Cardiology were consulted who felt the patient's symptoms were concerning for unstable angina, started IV heparin and transferred him for heart catheteriation.  Coronary angiography showed no significant atherosclerosis, normal EDPs.  He was started on aspirin 81 mg daily and metoprolol 25 mg BID by Cardiology, and cleared for discharge.  Dysphagia Overnight, patient noted dysphagia with lying back.  He was concerned this was from taking new carvedilol, although the temporal relationship was unclear.  He had no signs of anaphylactic reaction or hives or angioedema.  Hypertension He was continued on lisinopril.  Consider changing to losartan due to chronic cough.  He was started on low dose metoprolol.  Recent candida esophagitis HIV testing negative.  Patient completed Diflucan and has GI follow up planned.      Discharge Diagnoses:  Active Problems:   Essential hypertension   Hyperlipidemia   History of nephrectomy   Chest pain   Unstable angina [facility])    Discharge Instructions  Discharge Instructions    Diet - low sodium  heart healthy   Complete by:  As directed    Discharge instructions   Complete by:  As directed    From Dr. Loleta Books: You were admitted to the hospital for chest pain.  While you were here, we ruled out that your chest pain was from a blood clot in the lungs.   You were evaluated by Cardiology, and your story was similar enough to a heart attack that they felt a heart cath was necessary, but when they did this, it was completely reassuring.  There was no signs of blockages in the blood vessels of your heart at all (in other words, no atherosclerosis, which causes heart attacks, at all). Follow up with Cardiology as they have recommended. Resume your home lisinopril Start a baby aspirin daily 81 mg and start metoprolol 25 mg twice daily (this is a blood pressure medicine recommended by Dr. Burt Knack)  As a result, I have to assume your symptoms were from the stomach issues that you have been seeing Dr. Loletha Carrow for.  Try increasing your omeprazole/Prilosec to 20 mg twice daily Call [facility] to schedule a follow up appointment to see how your symptoms are doing with the higher dose of omeprazole and also to follow up your blood pressure on the new metoprolol.   Increase activity slowly   Complete by:  As directed      Allergies as of 05/04/2018      Reactions   Nuvigil [armodafinil] Other (See Comments)   Seizures   Seroquel [quetiapine] Other (See Comments)   Seizures.   Simvastatin Hives      Medication List  STOP taking these medications   aspirin-acetaminophen-caffeine 250-250-65 MG tablet Commonly known as:  EXCEDRIN MIGRAINE   esomeprazole 40 MG capsule Commonly known as:  NEXIUM   fluconazole 100 MG tablet Commonly known as:  DIFLUCAN   SLEEP AID PO     TAKE these medications   aspirin 81 MG EC tablet Take 1 tablet (81 mg total) by mouth daily. Start taking on:  05/05/2018   betamethasone dipropionate 0.05 % cream Commonly known as:  DIPROLENE APPLY TO AFFECTED  AREA UP TO TWICE A DAY AS NEEDED. DO NOT APPLY TO FACE, GROIN, OR UNDER ARMS.   lisinopril 10 MG tablet Commonly known as:  PRINIVIL,ZESTRIL Take 1 tablet (10 mg total) by mouth daily.   metoprolol tartrate 25 MG tablet Commonly known as:  LOPRESSOR Take 1 tablet (25 mg total) by mouth 2 (two) times daily.   omeprazole 20 MG capsule Commonly known as:  PRILOSEC Take 1 capsule (20 mg total) by mouth daily.   Vitamin D3 5000 units Caps Take 1 capsule by mouth daily.       Allergies  Allergen Reactions  . Nuvigil [Armodafinil] Other (See Comments)    Seizures   . Seroquel [Quetiapine] Other (See Comments)    Seizures.  . Simvastatin Hives    Consultations:  Cardiology   Procedures/Studies: Dg Chest 2 View  Result Date: 05/03/2018 CLINICAL DATA:  Intermittent left-sided and central chest pain since yesterday. EXAM: CHEST - 2 VIEW COMPARISON:  None. FINDINGS: The heart size and mediastinal contours are within normal limits. Minimal aortic atherosclerosis at the arch. Minimal subsegmental atelectasis identified at the left lung base. No pulmonary consolidation or CHF. No effusion or pneumothorax. Lower cervical ACDF hardware in place. IMPRESSION: No active cardiopulmonary disease. Electronically Signed   By: Ashley Royalty M.D.   On: 05/03/2018 15:51   Left heart catheterization  Angiographically normal coronary arteries.  The left ventricular systolic function is normal -- The left ventricular ejection fraction is greater than 65% by visual estimate.  LV end diastolic pressure is normal.   AO Systolic Pressure 622 mmHg  AO Diastolic Pressure 78 mmHg  AO Mean 297 mmHg  LV Systolic Pressure 989 mmHg  LV Diastolic Pressure 7 mmHg  LV EDP 15 mmHg  Arterial Occlusion Pressure Extended Systolic Pressure 211 mmHg  Arterial Occlusion Pressure Extended Diastolic Pressure 82 mmHg  Arterial Occlusion Pressure Extended Mean Pressure 102 mmHg  Left Ventricular Apex Extended  Systolic Pressure 941 mmHg  Left Ventricular Apex Extended Diastolic Pressure 7 mmHg  Left Ventricular Apex Extended EDP Pressure 18 mmHg    Non-anginal Chest Pain.   Plan:   TR Band removal in PACU Holding Area - d/c home after bedrest    Subjective: Feels well.  THinks he needs to lose weight.  No pain at rest.  No dyspnea.  GLobus sensation noted.    Discharge Exam: Vitals:   05/04/18 1508 05/04/18 1526  BP: 129/77 (!) 134/92  Pulse: (!) 56 70  Resp: 14   Temp:  98 F (36.7 C)  SpO2: 97% 99%   Vitals:   05/04/18 1455 05/04/18 1500 05/04/18 1508 05/04/18 1526  BP: 123/86  129/77 (!) 134/92  Pulse: (!) 57 70 (!) 56 70  Resp: (!) 23 17 14    Temp:    98 F (36.7 C)  TempSrc:    Oral  SpO2: 94% 96% 97% 99%  Weight:      Height:        General: Pt  is alert, awake, not in acute distress Cardiovascular: RRR, S1/S2 +, no rubs, no gallops Respiratory: CTA bilaterally, no wheezing, no rhonchi Abdominal: Soft, NT, ND, bowel sounds + Extremities: no edema, no cyanosis    The results of significant diagnostics from this hospitalization (including imaging, microbiology, ancillary and laboratory) are listed below for reference.     Microbiology: No results found for this or any previous visit (from the past 240 hour(s)).   Labs: BNP (last 3 results) Recent Labs    05/03/18 1634  BNP 65.7   Basic Metabolic Panel: Recent Labs  Lab 05/03/18 1523  NA 136  K 4.2  CL 106  CO2 22  GLUCOSE 100*  BUN 13  CREATININE 1.07  CALCIUM 9.2   Liver Function Tests: No results for input(s): AST, ALT, ALKPHOS, BILITOT, PROT, ALBUMIN in the last 168 hours. No results for input(s): LIPASE, AMYLASE in the last 168 hours. No results for input(s): AMMONIA in the last 168 hours. CBC: Recent Labs  Lab 05/03/18 1523  WBC 9.4  HGB 17.4*  HCT 48.6  MCV 90.7  PLT 270   Cardiac Enzymes: Recent Labs  Lab 05/03/18 2055 05/03/18 2152 05/04/18 0244  TROPONINI <0.03 <0.03  <0.03   BNP: Invalid input(s): POCBNP CBG: No results for input(s): GLUCAP in the last 168 hours. D-Dimer Recent Labs    05/03/18 1634  DDIMER 0.29   Hgb A1c No results for input(s): HGBA1C in the last 72 hours. Lipid Profile Recent Labs    05/04/18 0244  CHOL 260*  HDL 33*  LDLCALC 165*  TRIG 309*  CHOLHDL 7.9   Thyroid function studies No results for input(s): TSH, T4TOTAL, T3FREE, THYROIDAB in the last 72 hours.  Invalid input(s): FREET3 Anemia work up No results for input(s): VITAMINB12, FOLATE, FERRITIN, TIBC, IRON, RETICCTPCT in the last 72 hours. Urinalysis    Component Value Date/Time   COLORURINE YELLOW 05/03/2018 2129   APPEARANCEUR CLEAR 05/03/2018 2129   LABSPEC 1.026 05/03/2018 2129   PHURINE 7.0 05/03/2018 2129   GLUCOSEU NEGATIVE 05/03/2018 2129   HGBUR NEGATIVE 05/03/2018 2129   BILIRUBINUR NEGATIVE 05/03/2018 2129   BILIRUBINUR Negative 06/18/2017 1543   KETONESUR NEGATIVE 05/03/2018 2129   PROTEINUR NEGATIVE 05/03/2018 2129   UROBILINOGEN 0.2 06/18/2017 1543   NITRITE NEGATIVE 05/03/2018 2129   LEUKOCYTESUR NEGATIVE 05/03/2018 2129   Sepsis Labs Invalid input(s): PROCALCITONIN,  WBC,  LACTICIDVEN Microbiology No results found for this or any previous visit (from the past 240 hour(s)).   Time coordinating discharge: 45 minutes      SIGNED:   Edwin Dada, MD  [facility] 05/04/2018, 4:43 PM

## 2018-05-04 NOTE — Progress Notes (Signed)
Pt being transported to [facility] for cardiac catherization. Report given to [facility] for to transport. Heparin gtt inititated by carelink on transport. Pt stable at this time.

## 2018-05-04 NOTE — Interval H&P Note (Signed)
History and Physical Interval Note:  05/04/2018 1:47 PM  Evan [name]  has presented today for surgery, with the diagnosis of unstable angina,   The various methods of treatment have been discussed with the patient and family. After consideration of risks, benefits and other options for treatment, the patient has consented to  Procedure(s): LEFT HEART CATH AND CORONARY ANGIOGRAPHY (N/A) with possible PERCUTANEOUS CORONARY INTERVENTION as a surgical intervention .    The patient's history has been reviewed, patient examined, no change in status, stable for surgery.  I have reviewed the patient's chart and labs.  Questions were answered to the patient's satisfaction.     Cath Lab Visit (complete for each Cath Lab visit)  Clinical Evaluation Leading to the Procedure:   ACS: Yes.    Non-ACS:    Anginal Classification: CCS III  Anti-ischemic medical therapy: No Therapy  Non-Invasive Test Results: No non-invasive testing performed  Prior CABG: No previous CABG   Glenetta Hew

## 2018-05-04 NOTE — Progress Notes (Signed)
[facility] arrived at bedside will start heparing gtt. aspirin given. Pt a&ox4, ambulatory to stretcher.

## 2018-05-04 NOTE — Progress Notes (Addendum)
[place] for Heparin Indication: chest pain/ACS  Allergies  Allergen Reactions  . Nuvigil [Armodafinil] Other (See Comments)    Seizures   . Seroquel [Quetiapine] Other (See Comments)    Seizures.  . Simvastatin Hives   Patient Measurements: Height: 5\' 10"  (177.8 cm) Weight: 280 lb (127 kg) IBW/kg (Calculated) : 73 Heparin Dosing Weight: 102kg  Vital Signs: Temp: 97.9 F (36.6 C) (05/22 0850) Temp Source: Oral (05/22 0850) BP: 141/91 (05/22 0850) Pulse Rate: 72 (05/22 0850)  Labs: Recent Labs    05/03/18 1523 05/03/18 2055 05/03/18 2152 05/04/18 0244  HGB 17.4*  --   --   --   HCT 48.6  --   --   --   PLT 270  --   --   --   CREATININE 1.07  --   --   --   TROPONINI  --  <0.03 <0.03 <0.03   Estimated Creatinine Clearance: 106.8 mL/min (by C-G formula based on SCr of 1.07 mg/dL).  Medical History: Past Medical History:  Diagnosis Date  . Arthritis   . Chronic kidney disease   . Colon polyps    benign per pt  . Hyperlipidemia   . Hypertension   . Lyme disease    Medications:  Scheduled:  . aspirin  324 mg Oral STAT  . [START ON 05/05/2018] aspirin EC  81 mg Oral Daily  . heparin  4,000 Units Intravenous Once  . metoprolol tartrate  25 mg Oral BID  . pantoprazole  40 mg Oral Daily   Infusions:  . heparin      Assessment: Evan [name] admit 5/21 with chest pain Recent 14 days Fluconazole for candidal esophagitis PMH: HTN, GERD, HPL, L Nephrectomy   Lovenox 40mg  x1 on 5/21  Begin Heparin bolus infusion, r/o ACS  Goal of Therapy:  Heparin level 0.3-0.7 units/ml Monitor platelets by anticoagulation protocol: Yes   Plan:   Heparin bolus 4000 units, infusion at 1700 units/hr  1st Heparin level at 8pm  Daily CBC while on Heparin  Daily Hep level when at steady state  Minda Ditto PharmD Pager [phone] 05/04/2018, 12:57 PM

## 2018-05-04 NOTE — Progress Notes (Signed)
  Echocardiogram 2D Echocardiogram has been performed.  Evan [name] 05/04/2018, 12:56 PM

## 2018-05-04 NOTE — Discharge Instructions (Signed)

## 2018-05-04 NOTE — Progress Notes (Signed)
PROGRESS NOTE    Evan [name]  IZT:[id number] DOB: [birth year] DOA: 05/03/2018 PCP: [name], NP      Brief Narrative:  Evan [name] is a 52 y.o. 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   Assessment & Plan:  Chest pain Exertional chest pain, radiating to neck, relieved with rest.  Mother with CABG in 21s, patient history of obesity, HTN poorly controlled, hyperlipidemia.  ECG nonischemic, troponins negative.  Aspirin 325 given yesterday.  Doubt this is esophagitis because it is so clearly exertional.  Dimer negative.  BNP normal. -Consult to Cardiology, defer ischemic work up to them -Monitor on tele   Dysphagia Overnight after taking new BB, had symptoms of difficulty swallowing, globus.  I suspect this was from reflux, not the medicine.  Nonetheless, reasonable to try alternative BB.  Also reasonable to replace ACE with ARB.     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  GERD -Continue PPI as formulary alternative  Recent candida esophagitis Had recent biopsy showing candida.  Completed 14 days fluconazole.  Missed GI f/u because of death in family.  Emphasized importance of GI f/u.  Needs HIV testing. -F/u HIV         DVT prophylaxis: Lovenox Code Status: FULL Family Communication: None present MDM and disposition Plan: The below labs and imaging reports were reviewed.  The patient's status is clinically stable.    Chest pain rule out, now will need ischemic risk stratification, pending cardiology reocmmendations.     Consultants:   Cardiology  Procedures:   None  Antimicrobials:   None    Subjective: Feels globus in throat again this morning after second dose of  carvedilol.  No chst pain at rest.  Mild leg swelling.  No orthponea, PND.  No exertional pain overnight.  No vomiting, fever.  Objective: Vitals:   05/04/18 0020 05/04/18 0040 05/04/18 0545 05/04/18 0850  BP:   131/85 (!) 141/91  Pulse: 79 81 73 72  Resp:   18 18  Temp:   (!) 97.5 F (36.4 C) 97.9 F (36.6 C)  TempSrc:   Oral Oral  SpO2: 98% 95% 95% 95%  Weight:      Height:        Intake/Output Summary (Last 24 hours) at 05/04/2018 1053 Last data filed at 05/04/2018 0556 Gross per 24 hour  Intake -  Output 600 ml  Net -600 ml   Filed Weights   05/03/18 1446 05/03/18 2130  Weight: 117.9 kg (260 lb) 127 kg (280 lb)    Examination: General appearance: Obese adult male, alert and in no acute distress.   HEENT: Anicteric, conjunctiva pink, lids and lashes normal. No nasal deformity, discharge, epistaxis.  Lips moist, teeth normal, OP moiist without oral lesions, Mallampati 3.   Skin: Warm and dry.  No jaundice.  No suspicious rashes or lesions. Cardiac: RRR, nl S1-S2, no murmurs appreciated.  Capillary refill is brisk.  JVP not visible.  Trace pretibial LE edema.  Radial pulses 2+ and symmetric. Respiratory: Normal respiratory rate and rhythm.  CTAB without rales or wheezes. Abdomen: Abdomen soft.  No TTP. No ascites, distension, hepatosplenomegaly.   MSK: No deformities or effusions. Neuro: Awake and alert.  EOMI, moves all extremities. Speech fluent.  Psych: Sensorium intact and responding to questions, attention normal. Affect normal.  Judgment and insight appear normal.    Data Reviewed: I have personally reviewed following labs and imaging studies:  CBC: Recent Labs  Lab 05/03/18 1523  WBC 9.4  HGB 17.4*  HCT 48.6  MCV 90.7  PLT 102   Basic Metabolic Panel: Recent Labs  Lab 05/03/18 1523  NA 136  K 4.2  CL 106  CO2 22  GLUCOSE 100*  BUN 13  CREATININE 1.07  CALCIUM 9.2   GFR: Estimated Creatinine Clearance: 106.8 mL/min (by C-G formula based on  SCr of 1.07 mg/dL). Liver Function Tests: No results for input(s): AST, ALT, ALKPHOS, BILITOT, PROT, ALBUMIN in the last 168 hours. No results for input(s): LIPASE, AMYLASE in the last 168 hours. No results for input(s): AMMONIA in the last 168 hours. Coagulation Profile: No results for input(s): INR, PROTIME in the last 168 hours. Cardiac Enzymes: Recent Labs  Lab 05/03/18 2055 05/03/18 2152 05/04/18 0244  TROPONINI <0.03 <0.03 <0.03   BNP (last 3 results) No results for input(s): PROBNP in the last 8760 hours. HbA1C: No results for input(s): HGBA1C in the last 72 hours. CBG: No results for input(s): GLUCAP in the last 168 hours. Lipid Profile: Recent Labs    05/04/18 0244  CHOL 260*  HDL 33*  LDLCALC 165*  TRIG 309*  CHOLHDL 7.9   Thyroid Function Tests: No results for input(s): TSH, T4TOTAL, FREET4, T3FREE, THYROIDAB in the last 72 hours. Anemia Panel: No results for input(s): VITAMINB12, FOLATE, FERRITIN, TIBC, IRON, RETICCTPCT in the last 72 hours. Urine analysis:    Component Value Date/Time   COLORURINE YELLOW 05/03/2018 2129   APPEARANCEUR CLEAR 05/03/2018 2129   LABSPEC 1.026 05/03/2018 2129   PHURINE 7.0 05/03/2018 2129   GLUCOSEU NEGATIVE 05/03/2018 2129   HGBUR NEGATIVE 05/03/2018 2129   BILIRUBINUR NEGATIVE 05/03/2018 2129   BILIRUBINUR Negative 06/18/2017 1543   KETONESUR NEGATIVE 05/03/2018 2129   PROTEINUR NEGATIVE 05/03/2018 2129   UROBILINOGEN 0.2 06/18/2017 1543   NITRITE NEGATIVE 05/03/2018 2129   LEUKOCYTESUR NEGATIVE 05/03/2018 2129   Sepsis Labs: @LABRCNTIP (procalcitonin:4,lacticacidven:4)  )No results found for this or any previous visit (from the past 240 hour(s)).       Radiology Studies: Dg Chest 2 View  Result Date: 05/03/2018 CLINICAL DATA:  Intermittent left-sided and central chest pain since yesterday. EXAM: CHEST - 2 VIEW COMPARISON:  None. FINDINGS: The heart size and mediastinal contours are within normal limits.  Minimal aortic atherosclerosis at the arch. Minimal subsegmental atelectasis identified at the left lung base. No pulmonary consolidation or CHF. No effusion or pneumothorax. Lower cervical ACDF hardware in place. IMPRESSION: No active cardiopulmonary disease. Electronically Signed   By: Ashley Royalty M.D.   On: 05/03/2018 15:51        Scheduled Meds: . enoxaparin (LOVENOX) injection  40 mg Subcutaneous Q24H  . pantoprazole  40 mg Oral Daily   Continuous Infusions:   LOS: 0 days    Time spent: 25 minutes    Edwin Dada, MD [facility] 05/04/2018, 10:53 AM     Pager [phone] --- please page though AMION:  [email or web address] Password [email or web address] If 7PM-7AM, please contact night-coverage

## 2018-05-05 ENCOUNTER — Encounter (HOSPITAL_COMMUNITY): Payer: Self-pay | Admitting: Cardiology

## 2018-05-05 MED FILL — Heparin Sod (Porcine)-NaCl IV Soln 1000 Unit/500ML-0.9%: INTRAVENOUS | Qty: 1000 | Status: AC

## 2018-05-25 ENCOUNTER — Encounter: Payer: Self-pay | Admitting: Cardiology

## 2018-05-25 ENCOUNTER — Ambulatory Visit: Payer: Managed Care, Other (non HMO) | Admitting: Cardiology

## 2018-05-25 ENCOUNTER — Encounter

## 2018-05-25 VITALS — BP 134/100 | HR 91 | Ht 70.0 in | Wt 277.8 lb

## 2018-05-25 DIAGNOSIS — I10 Essential (primary) hypertension: Secondary | ICD-10-CM

## 2018-05-25 DIAGNOSIS — Z79899 Other long term (current) drug therapy: Secondary | ICD-10-CM | POA: Diagnosis not present

## 2018-05-25 MED ORDER — LISINOPRIL 20 MG PO TABS: 20.0000 mg | ORAL_TABLET | Freq: Every day | ORAL | 3 refills | Status: DC

## 2018-05-25 MED ORDER — SPIRONOLACTONE 25 MG PO TABS: 25.0000 mg | ORAL_TABLET | Freq: Every day | ORAL | 3 refills | Status: DC

## 2018-05-25 NOTE — Patient Instructions (Signed)
Medication Instructions:  Please increase your Lisinopril to 20 mg a day.   Start Spironolactone 25 mg a day. Continue all other medications as listed.  Labwork: Please have blood work in 1 week (BMP)  Follow-Up: Follow up in the Hypertension Clinic at their next available appointment.  If you need a refill on your cardiac medications before your next appointment, please call your pharmacy.  Thank you for choosing [facility]!!

## 2018-05-25 NOTE — Progress Notes (Signed)
Cardiology Office Note:    Date:  05/25/2018   ID:  Evan [name], DOB February 22, 1966, MRN [id number]  PCP:  [name], NP  Cardiologist:  No primary care provider on file.   Referring MD: [name], MD     History of Present Illness:    Evan [name] is a 52 y.o. male with history of nephrectomy, recent hospitalization with accelerating chest pain who underwent cardiac catheterization that showed no significant coronary atherosclerosis, normal left ventricular end-diastolic pressure here for the evaluation of hypertension.   Placed on 81 mg of aspirin and 25 mg of metoprolol twice daily, which she has not started.  Lisinopril when increased HA, dizziness. Leg aches. Hurt. Dizzy. Saw Nepho in [place].   All of a sudden has extreme charlie horse in one side of abd, knot, passed out from pain.   Other complaints for dysphasia when laying back.  Also had hypertension and Candida esophagitis completed Diflucan, GI follow-up.  He has had problems with cramping in the past, abdominal pain, has had work-ups in the past, demonstrating adhesions, has had hernia repair, nephrectomy.  Was worried at one point about leg swelling.  Fatigue.  Past Medical History:  Diagnosis Date  . Arthritis   . Chronic kidney disease   . Colon polyps    benign per pt  . Hyperlipidemia   . Hypertension   . Lyme disease     Past Surgical History:  Procedure Laterality Date  . HERNIA REPAIR     x 2  . LEFT HEART CATH AND CORONARY ANGIOGRAPHY N/A 05/04/2018   Procedure: LEFT HEART CATH AND CORONARY ANGIOGRAPHY;  Surgeon: Leonie Man, MD;  Location: [facility];  Service: Cardiovascular;  Laterality: N/A;  . NECK SURGERY    . NEPHRECTOMY Left 2010  . SHOULDER SURGERY Left     Current Medications: Current Meds  Medication Sig  . aspirin EC 81 MG EC tablet Take 1 tablet (81 mg total) by mouth daily.  . betamethasone dipropionate (DIPROLENE) 0.05 % cream APPLY TO AFFECTED AREA UP TO  TWICE A DAY AS NEEDED. DO NOT APPLY TO FACE, GROIN, OR UNDER ARMS.  . Cholecalciferol (VITAMIN D3) 5000 units CAPS Take 1 capsule by mouth as directed.   Marland Kitchen lisinopril (PRINIVIL,ZESTRIL) 20 MG tablet Take 1 tablet (20 mg total) by mouth daily.  Marland Kitchen omeprazole (PRILOSEC) 20 MG capsule Take 1 capsule (20 mg total) by mouth daily.  . [DISCONTINUED] lisinopril (PRINIVIL,ZESTRIL) 10 MG tablet Take 1 tablet (10 mg total) by mouth daily.  . [DISCONTINUED] metoprolol tartrate (LOPRESSOR) 25 MG tablet Take 1 tablet (25 mg total) by mouth 2 (two) times daily.   Current Facility-Administered Medications for the 05/25/18 encounter (Office Visit) with Jerline Pain, MD  Medication  . 0.9 %  sodium chloride infusion     Allergies:   Nuvigil [armodafinil]; Seroquel [quetiapine]; and Simvastatin   Social History   Socioeconomic History  . Marital status: Married    Spouse name: Not on file  . Number of children: 4  . Years of education: Not on file  . Highest education level: Not on file  Occupational History  . Occupation: Freight forwarder  Social Needs  . Financial resource strain: Not on file  . Food insecurity:    Worry: Not on file    Inability: Not on file  . Transportation needs:    Medical: Not on file    Non-medical: Not on file  Tobacco Use  . Smoking status:  Never Smoker  . Smokeless tobacco: Never Used  Substance and Sexual Activity  . Alcohol use: Yes    Comment: occasional  . Drug use: No  . Sexual activity: Not on file  Lifestyle  . Physical activity:    Days per week: Not on file    Minutes per session: Not on file  . Stress: Not on file  Relationships  . Social connections:    Talks on phone: Not on file    Gets together: Not on file    Attends religious service: Not on file    Active member of club or organization: Not on file    Attends meetings of clubs or organizations: Not on file    Relationship status: Not on file  Other Topics Concern  . Not on file  Social History  Narrative  . Not on file     Family History: The patient's family history includes Arthritis in his maternal grandmother and mother; Bladder Cancer in his brother; Breast cancer in his father; Colon cancer in his father, maternal grandmother, and paternal uncle; Diabetes in his paternal grandfather and paternal uncle; Heart disease in his maternal grandfather, maternal grandmother, and mother; Hypertension in his mother; Kidney disease in his mother; Lung cancer in his brother, brother, father, and paternal aunt; Prostate cancer in his father.  ROS:   Please see the history of present illness.     All other systems reviewed and are negative.  EKGs/Labs/Other Studies Reviewed:    The following studies were reviewed today:  05/04/18 ECHO: - Left ventricle: The cavity size was normal. There was mild   concentric hypertrophy. Systolic function was normal. The   estimated ejection fraction was in the range of 60% to 65%. Wall   motion was normal; there were no regional wall motion   abnormalities. Doppler parameters are consistent with abnormal   left ventricular relaxation (grade 1 diastolic dysfunction).   There was no evidence of elevated ventricular filling pressure by   Doppler parameters. - Aortic valve: There was no regurgitation. - Right ventricle: Systolic function was normal. - Right atrium: The atrium was normal in size. - Tricuspid valve: There was trivial regurgitation. - Pulmonary arteries: Systolic pressure was within the normal   range. - Inferior vena cava: The vessel was normal in size. - Pericardium, extracardiac: There was no pericardial effusion.  EKG: Sinus rhythm.  Recent Labs: 06/18/2017: ALT 29 11/10/2017: TSH 1.50 05/03/2018: B Natriuretic Peptide 11.7; BUN 13; Creatinine, Ser 1.07; Hemoglobin 17.4; Platelets 270; Potassium 4.2; Sodium 136  Recent Lipid Panel    Component Value Date/Time   CHOL 260 (H) 05/04/2018 0244   TRIG 309 (H) 05/04/2018 0244   HDL  33 (L) 05/04/2018 0244   CHOLHDL 7.9 05/04/2018 0244   VLDL 62 (H) 05/04/2018 0244   LDLCALC 165 (H) 05/04/2018 0244   LDLDIRECT 173.0 04/14/2017 1105    Physical Exam:    VS:  BP (!) 134/100   Pulse 91   Ht 5\' 10"  (1.778 m)   Wt 277 lb 12.8 oz (126 kg)   SpO2 94%   BMI 39.86 kg/m     Wt Readings from Last 3 Encounters:  05/25/18 277 lb 12.8 oz (126 kg)  05/03/18 280 lb (127 kg)  02/17/18 268 lb (121.6 kg)     GEN:  Well nourished, well developed in no acute distress, obese HEENT: Normal NECK: No JVD; No carotid bruits LYMPHATICS: No lymphadenopathy CARDIAC: RRR, no murmurs, rubs, gallops RESPIRATORY:  Clear to auscultation without rales, wheezing or rhonchi  ABDOMEN: Soft, non-tender, non-distended MUSCULOSKELETAL:  No edema; No deformity  SKIN: Warm and dry NEUROLOGIC:  Alert and oriented x 3 PSYCHIATRIC:  Normal affect   ASSESSMENT:    1. Essential hypertension   2. Long-term use of high-risk medication   3. Morbid obesity ([place])    PLAN:    In order of problems listed above:  Accelerating chest pain -Reassuring cardiac catheterization on 05/04/2018-with no significant atherosclerosis.  No further work-up.  Continue with aggressive primary prevention.  Weight loss, exercise, blood pressure control.  Essential hypertension - We will once again try to increase his lisinopril from 10 to 20 mg.  He was worried about having side effects with this.  Let us try it again.  Also, I will go ahead and start spironolactone 25 mg once a day.  To have a mild diuretic would be helpful for him.  Sounds like he has had issues with refractory hypertension in the past.  Recheck basic metabolic profile in 1 week.  Please have follow-up in hypertension clinic.  This may be a good place to start.  He has not started his metoprolol.  This is fine.  We will use this as a third or fourth line agent for hypertension.  We could add amlodipine in the future.  If blood pressure continues to  remain elevated despite these measures, and with weight loss, salt restriction, we will consider renal duplex ultrasound. - Has history of nephrectomy.  Creatinine has been stable.  Medications reviewed.  Continue to follow closely with his primary physician, Dr. Carlis Abbott as well.    Mixed hyperlipidemia/hypertriglyceridemia - Weight loss.  Could consider fish oil for triglyceride reduction.  No coronary artery disease detected on angiogram, reassurance.  I would like to work on aggressive dietary modifications, weight loss.  Morbid obesity - Continue to encourage weight loss.  This will also help with his blood pressure.  Realistically needs a 70-80 pound weight loss.  Continue work on decrease carbohydrates.  Exercise.  Dysphasia/esophagitis - Per gastroenterology.  Not a current issue.    Medication Adjustments/Labs and Tests Ordered: Current medicines are reviewed at length with the patient today.  Concerns regarding medicines are outlined above.  Orders Placed This Encounter  Procedures  . Basic metabolic panel   Meds ordered this encounter  Medications  . lisinopril (PRINIVIL,ZESTRIL) 20 MG tablet    Sig: Take 1 tablet (20 mg total) by mouth daily.    Dispense:  90 tablet    Refill:  3  . spironolactone (ALDACTONE) 25 MG tablet    Sig: Take 1 tablet (25 mg total) by mouth daily.    Dispense:  90 tablet    Refill:  3    Patient Instructions  Medication Instructions:  Please increase your Lisinopril to 20 mg a day.   Start Spironolactone 25 mg a day. Continue all other medications as listed.  Labwork: Please have blood work in 1 week (BMP)  Follow-Up: Follow up in the Hypertension Clinic at their next available appointment.  If you need a refill on your cardiac medications before your next appointment, please call your pharmacy.  Thank you for choosing [facility]!!        Signed, Candee Furbish, MD  05/25/2018 10:13 AM    [place]

## 2018-06-02 ENCOUNTER — Other Ambulatory Visit: Payer: Managed Care, Other (non HMO)

## 2018-06-03 ENCOUNTER — Other Ambulatory Visit: Payer: Managed Care, Other (non HMO) | Admitting: *Deleted

## 2018-06-03 DIAGNOSIS — I10 Essential (primary) hypertension: Secondary | ICD-10-CM

## 2018-06-03 DIAGNOSIS — Z79899 Other long term (current) drug therapy: Secondary | ICD-10-CM

## 2018-06-03 LAB — BASIC METABOLIC PANEL
BUN/Creatinine Ratio: 12 (ref 9–20)
BUN: 13 mg/dL (ref 6–24)
CO2: 23 mmol/L (ref 20–29)
Calcium: 9.5 mg/dL (ref 8.7–10.2)
Chloride: 98 mmol/L (ref 96–106)
Creatinine, Ser: 1.08 mg/dL (ref 0.76–1.27)
GFR calc Af Amer: 90 mL/min/{1.73_m2} (ref >59)
GFR, EST NON AFRICAN AMERICAN: 78 mL/min/{1.73_m2} (ref >59)
GLUCOSE: 123 mg/dL — AB (ref 65–99)
Potassium: 4.6 mmol/L (ref 3.5–5.2)
SODIUM: 134 mmol/L (ref 134–144)

## 2018-09-23 ENCOUNTER — Ambulatory Visit: Payer: Managed Care, Other (non HMO) | Admitting: Gastroenterology

## 2018-09-23 ENCOUNTER — Encounter: Payer: Self-pay | Admitting: Gastroenterology

## 2018-09-23 VITALS — BP 100/80 | HR 70 | Ht 70.0 in | Wt 273.4 lb

## 2018-09-23 DIAGNOSIS — R198 Other specified symptoms and signs involving the digestive system and abdomen: Secondary | ICD-10-CM

## 2018-09-23 DIAGNOSIS — K648 Other hemorrhoids: Secondary | ICD-10-CM | POA: Diagnosis not present

## 2018-09-23 DIAGNOSIS — K219 Gastro-esophageal reflux disease without esophagitis: Secondary | ICD-10-CM

## 2018-09-23 DIAGNOSIS — R194 Change in bowel habit: Secondary | ICD-10-CM | POA: Diagnosis not present

## 2018-09-23 DIAGNOSIS — R1084 Generalized abdominal pain: Secondary | ICD-10-CM

## 2018-09-23 MED ORDER — HYOSCYAMINE SULFATE 0.125 MG SL SUBL: 0.1250 mg | SUBLINGUAL_TABLET | Freq: Four times a day (QID) | SUBLINGUAL | 2 refills | Status: DC | PRN

## 2018-09-23 NOTE — Progress Notes (Signed)
[facility] Progress Note  Chief Complaint: Generalized abdominal pain  Subjective  History:  Mr. Lince follows up after his upper endoscopy.  As before, he has a multitude of symptoms.  I saw him in February for GERD, throat pain, cough, chronic pelvic pain that is been going on for years with prior work-up in [place].  Upper endoscopy 02/17/2018 was normal except for some mild patchy esophageal candidiasis.  He finished a course of fluconazole for that.  He is concerned because over the last several months he has had more frequent episodes of lower abdominal crampy pain with bloating that then arises over the abdomen to the right side, and his abdomen becomes hard.  He had had a couple episodes of this while living in [place] a year or 2 ago, and now it is recurrent.  Years ago he had chronic left lower quadrant pain that he says got better after a surgery for adhesions.  During these episodes, he does not have nausea or vomiting.  His bowel habits are typically 3 or 4/day that are semi-formed to loose with some urgency.  Is also bothered by several episodes of painless rectal bleeding with bowel movements over the last few months, and recalls being told he had hemorrhoids on his last colonoscopy.  Sometimes he has "seepage" of semi-formed stool.  ROS: He was recently admitted for chest and jaw pain and underwent cardiac catheterization, noted below  He has scattered itching, arthralgias, myalgias, chronic fatigue and anxiety.  With all that, he finds it difficult to stay active and is frustrated by some weight gain. Remainder of systems negative except as above  The patient's Past Medical, Family and Social History were reviewed and are on file in the EMR.  Objective:  Med list reviewed  Current Outpatient Medications:  .  aspirin EC 81 MG EC tablet, Take 1 tablet (81 mg total) by mouth daily., Disp: 30 tablet, Rfl: 3 .  betamethasone dipropionate (DIPROLENE) 0.05 % cream,  APPLY TO AFFECTED AREA UP TO TWICE A DAY AS NEEDED. DO NOT APPLY TO FACE, GROIN, OR UNDER ARMS., Disp: , Rfl: 3 .  Cholecalciferol (VITAMIN D3) 5000 units CAPS, Take 1 capsule by mouth as directed. , Disp: , Rfl:  .  lisinopril (PRINIVIL,ZESTRIL) 20 MG tablet, Take 1 tablet (20 mg total) by mouth daily., Disp: 90 tablet, Rfl: 3 .  omeprazole (PRILOSEC) 20 MG capsule, Take 1 capsule (20 mg total) by mouth daily., Disp: 60 capsule, Rfl: 3 .  hyoscyamine (LEVSIN SL) 0.125 MG SL tablet, Place 1 tablet (0.125 mg total) under the tongue every 6 (six) hours as needed., Disp: 45 tablet, Rfl: 2 .  spironolactone (ALDACTONE) 25 MG tablet, Take 1 tablet (25 mg total) by mouth daily., Disp: 90 tablet, Rfl: 3  Current Facility-Administered Medications:  .  0.9 %  sodium chloride infusion, 500 mL, Intravenous, Once, Danis, Estill Cotta III, MD   Vital signs in last 24 hrs: Vitals:   09/23/18 1529  BP: 100/80  Pulse: 70  SpO2: 98%    Physical Exam    HEENT: sclera anicteric, oral mucosa moist without lesions  Neck: supple, no thyromegaly, JVD or lymphadenopathy  Cardiac: RRR without murmurs, S1S2 heard, no peripheral edema  Pulm: clear to auscultation bilaterally, normal RR and effort noted  Abdomen: soft, no tenderness, with active bowel sounds. No guarding or palpable hepatosplenomegaly.  Skin; warm and dry, no jaundice or rash Rectal: Normal external, no fissure or tenderness, no palpable  internal lesions. Anoscopy: 3 columns of grade 1 internal hemorrhoids  Recent Labs:  Normal cardiac cath 05/04/18   @ASSESSMENTPLANBEGIN @ Assessment: Encounter Diagnoses  Name Primary?  . Generalized abdominal pain Yes  . Altered bowel habits   . Gastroesophageal reflux disease, esophagitis presence not specified   . Bleeding internal hemorrhoids   . Tenesmus     His GERD symptoms of lately been under good control. Loris seems to have a functional bowel disorder that has been flaring up in the  last few months for unclear reasons.  He has intermittent hemorrhoidal bleeding that I think is exacerbated by frequency of his BMs.  Plan: Continue once daily omeprazole Redouble efforts at diet and lifestyle changes for reflux I prescribed hyoscyamine to take once a day for at least the next couple weeks, second daily dose can be taken as needed for cramps and bloating. He will try to get records from [place] when his last colonoscopy was done.  He should have it at a 5-year interval due to family history of colon cancer.   Total time 30 minutes, over half spent face-to-face with patient in counseling and coordination of care.   Nelida Meuse III

## 2018-09-23 NOTE — Patient Instructions (Addendum)
If you are age 54 or older, your body mass index should be between 23-30. Your Body mass index is 39.23 kg/m. If this is out of the aforementioned range listed, please consider follow up with your Primary Care Provider.  If you are age 60 or younger, your body mass index should be between 19-25. Your Body mass index is 39.23 kg/m. If this is out of the aformentioned range listed, please consider follow up with your Primary Care Provider.   We have sent the following medications to your pharmacy for you to pick up at your convenience: Levsin    It was a pleasure to see you today!  Dr. Loletha Carrow

## 2018-11-04 ENCOUNTER — Other Ambulatory Visit: Payer: Self-pay | Admitting: Primary Care

## 2018-11-04 ENCOUNTER — Encounter: Payer: Self-pay | Admitting: Primary Care

## 2018-11-04 ENCOUNTER — Ambulatory Visit: Payer: Managed Care, Other (non HMO) | Admitting: Primary Care

## 2018-11-04 VITALS — BP 126/82 | HR 82 | Temp 98.1°F | Ht 70.0 in | Wt 279.5 lb

## 2018-11-04 DIAGNOSIS — M791 Myalgia, unspecified site: Secondary | ICD-10-CM | POA: Insufficient documentation

## 2018-11-04 DIAGNOSIS — R1084 Generalized abdominal pain: Secondary | ICD-10-CM

## 2018-11-04 DIAGNOSIS — R7303 Prediabetes: Secondary | ICD-10-CM

## 2018-11-04 HISTORY — DX: Myalgia, unspecified site: M79.10

## 2018-11-04 LAB — CBC WITH DIFFERENTIAL/PLATELET
BASOS PCT: 1.2 % (ref 0.0–3.0)
Basophils Absolute: 0.1 10*3/uL (ref 0.0–0.1)
EOS ABS: 0.5 10*3/uL (ref 0.0–0.7)
Eosinophils Relative: 4.8 % (ref 0.0–5.0)
HCT: 49.1 % (ref 39.0–52.0)
Hemoglobin: 17 g/dL (ref 13.0–17.0)
LYMPHS PCT: 27.9 % (ref 12.0–46.0)
Lymphs Abs: 2.6 10*3/uL (ref 0.7–4.0)
MCHC: 34.6 g/dL (ref 30.0–36.0)
MCV: 92.4 fl (ref 78.0–100.0)
MONO ABS: 1.1 10*3/uL — AB (ref 0.1–1.0)
Monocytes Relative: 11.1 % (ref 3.0–12.0)
Neutro Abs: 5.2 10*3/uL (ref 1.4–7.7)
Neutrophils Relative %: 55 % (ref 43.0–77.0)
Platelets: 289 10*3/uL (ref 150.0–400.0)
RBC: 5.32 Mil/uL (ref 4.22–5.81)
RDW: 14 % (ref 11.5–15.5)
WBC: 9.5 10*3/uL (ref 4.0–10.5)

## 2018-11-04 LAB — HEPATIC FUNCTION PANEL
ALT: 35 U/L (ref 0–53)
AST: 27 U/L (ref 0–37)
Albumin: 4.4 g/dL (ref 3.5–5.2)
Alkaline Phosphatase: 50 U/L (ref 39–117)
Bilirubin, Direct: 0 mg/dL (ref 0.0–0.3)
TOTAL PROTEIN: 7.5 g/dL (ref 6.0–8.3)
Total Bilirubin: 0.4 mg/dL (ref 0.2–1.2)

## 2018-11-04 LAB — POC URINALSYSI DIPSTICK (AUTOMATED)
BILIRUBIN UA: NEGATIVE
GLUCOSE UA: NEGATIVE
Ketones, UA: NEGATIVE
Leukocytes, UA: NEGATIVE
Nitrite, UA: NEGATIVE
Protein, UA: NEGATIVE
RBC UA: NEGATIVE
Spec Grav, UA: 1.02 (ref 1.010–1.025)
Urobilinogen, UA: 0.2 E.U./dL
pH, UA: 6 (ref 5.0–8.0)

## 2018-11-04 LAB — BASIC METABOLIC PANEL
BUN: 13 mg/dL (ref 6–23)
CHLORIDE: 101 meq/L (ref 96–112)
CO2: 31 mEq/L (ref 19–32)
Calcium: 9.8 mg/dL (ref 8.4–10.5)
Creatinine, Ser: 1.2 mg/dL (ref 0.40–1.50)
GFR: 66.97 mL/min (ref >60.00)
GLUCOSE: 105 mg/dL — AB (ref 70–99)
POTASSIUM: 4.3 meq/L (ref 3.5–5.1)
SODIUM: 137 meq/L (ref 135–145)

## 2018-11-04 LAB — HEMOGLOBIN A1C: Hgb A1c MFr Bld: 6.2 % (ref 4.6–6.5)

## 2018-11-04 NOTE — Patient Instructions (Signed)
Stop by the lab prior to leaving today. I will notify you of your results once received.   Stop by the front desk and speak with either Rosaria Ferries or Anastasiya regarding your ultrasound.  Continue omeprazole.   It was a pleasure to see you today!

## 2018-11-04 NOTE — Assessment & Plan Note (Signed)
Intermittent over the last 1 year.  Unclear etiology based off of HPI and prior lab testing.  He is not managed on a statin.  BMP from June 2019 with hyperglycemia, otherwise unremarkable.  A1c, CMP, CBC pending today.  We will also check abdominal ultrasound given abdominal symptoms.  Recommended proper hydration and regular stretching.

## 2018-11-04 NOTE — Progress Notes (Signed)
Subjective:    Patient ID: Evan [name], male    DOB: [birth year], 52 y.o.   MRN: [id number]  HPI  Mr. Colberg is a 52 year old male with a history of unstable angina, hypertension, GERD who presents today with a chief complaint of myalgias and abdominal pain.  One year ago he noticed a "charley horse" sensation to his generalized abdomen after assisting his family member with moving. He felt so much tightness to the abdomen that he had to pull over to the side of the road and straighten out his body in order for relief.   Over the last one year he's noticed cramping to nearly any muscle including upper and lower extremities, abdomen, lower back. He will be watching TV, scratch the back of his neck with his hand and will notice cramping to the same arm he used to scratched his head. Most of the cramping is to the general abdomen. He feels a pain/virbation, cold/warm sensation to the right upper quadrant. Over the last two weeks these symptoms are more intense and will radiate around to his right flank. Worse after eating, also with nausea.   He went to see GI about one month ago for these symptoms. He also mentioned during his visit that he had "seepage" of stool intermittently, also with bright red rectal bleeding. He underwent exam and was told that he had internal hemorrhoids without fissures. He was prescribed hyoscyamine for presumed IBS symptoms for which he never filled. He is due for colonoscopy in 2020.  He denies constipation, diarrhea, vomiting, fevers, chills, hematuria, dysuria. He's compliant to his omeprazole 20 mg daily, no breakthrough reflux symptoms. He does have intermittent rectal bleeding with bowel movements which are overall improved.   Review of Systems  Constitutional: Negative for appetite change.  Gastrointestinal: Positive for abdominal pain and nausea. Negative for constipation, diarrhea and vomiting.       Intermittent rectal bleeding with bowel movements    Musculoskeletal: Positive for myalgias.  Neurological: Negative for dizziness and weakness.       Past Medical History:  Diagnosis Date  . Arthritis   . Chronic kidney disease   . Colon polyps    benign per pt  . Hyperlipidemia   . Hypertension   . Lyme disease      Social History   Socioeconomic History  . Marital status: Married    Spouse name: Not on file  . Number of children: 4  . Years of education: Not on file  . Highest education level: Not on file  Occupational History  . Occupation: Freight forwarder  Social Needs  . Financial resource strain: Not on file  . Food insecurity:    Worry: Not on file    Inability: Not on file  . Transportation needs:    Medical: Not on file    Non-medical: Not on file  Tobacco Use  . Smoking status: Never Smoker  . Smokeless tobacco: Never Used  Substance and Sexual Activity  . Alcohol use: Yes    Comment: occasional  . Drug use: No  . Sexual activity: Not on file  Lifestyle  . Physical activity:    Days per week: Not on file    Minutes per session: Not on file  . Stress: Not on file  Relationships  . Social connections:    Talks on phone: Not on file    Gets together: Not on file    Attends religious service: Not on file    Active  member of club or organization: Not on file    Attends meetings of clubs or organizations: Not on file    Relationship status: Not on file  . Intimate partner violence:    Fear of current or ex partner: Not on file    Emotionally abused: Not on file    Physically abused: Not on file    Forced sexual activity: Not on file  Other Topics Concern  . Not on file  Social History Narrative  . Not on file    Past Surgical History:  Procedure Laterality Date  . HERNIA REPAIR     x 2  . LEFT HEART CATH AND CORONARY ANGIOGRAPHY N/A 05/04/2018   Procedure: LEFT HEART CATH AND CORONARY ANGIOGRAPHY;  Surgeon: Leonie Man, MD;  Location: [facility];  Service: Cardiovascular;  Laterality:  N/A;  . NECK SURGERY    . NEPHRECTOMY Left 2010  . SHOULDER SURGERY Left     Family History  Problem Relation Age of Onset  . Arthritis Mother   . Heart disease Mother   . Hypertension Mother   . Kidney disease Mother   . Colon cancer Father        dx in his early 59's  . Lung cancer Father   . Prostate cancer Father   . Breast cancer Father        81 or 19  . Lung cancer Brother        smoker  . Lung cancer Paternal Aunt   . Colon cancer Paternal Uncle   . Diabetes Paternal Uncle   . Arthritis Maternal Grandmother   . Colon cancer Maternal Grandmother   . Heart disease Maternal Grandmother   . Heart disease Maternal Grandfather   . Diabetes Paternal Grandfather   . Lung cancer Brother        smoker  . Bladder Cancer Brother     Allergies  Allergen Reactions  . Nuvigil [Armodafinil] Other (See Comments)    Seizures   . Seroquel [Quetiapine] Other (See Comments)    Seizures.  . Simvastatin Hives    Current Outpatient Medications on File Prior to Visit  Medication Sig Dispense Refill  . aspirin EC 81 MG EC tablet Take 1 tablet (81 mg total) by mouth daily. 30 tablet 3  . betamethasone dipropionate (DIPROLENE) 0.05 % cream APPLY TO AFFECTED AREA UP TO TWICE A DAY AS NEEDED. DO NOT APPLY TO FACE, GROIN, OR UNDER ARMS.  3  . Cholecalciferol (VITAMIN D3) 5000 units CAPS Take 1 capsule by mouth as directed.     Marland Kitchen lisinopril (PRINIVIL,ZESTRIL) 20 MG tablet Take 1 tablet (20 mg total) by mouth daily. 90 tablet 3  . omeprazole (PRILOSEC) 20 MG capsule Take 1 capsule (20 mg total) by mouth daily. 60 capsule 3  . spironolactone (ALDACTONE) 25 MG tablet Take 1 tablet (25 mg total) by mouth daily. 90 tablet 3   Current Facility-Administered Medications on File Prior to Visit  Medication Dose Route Frequency Provider Last Rate Last Dose  . 0.9 %  sodium chloride infusion  500 mL Intravenous Once Nelida Meuse III, MD        BP 126/82   Pulse 82   Temp 98.1 F (36.7  C) (Oral)   Ht 5\' 10"  (1.778 m)   Wt 279 lb 8 oz (126.8 kg)   SpO2 96%   BMI 40.10 kg/m    Objective:   Physical Exam  Constitutional: He appears well-nourished.  Cardiovascular:  Normal rate and regular rhythm.  Respiratory: Effort normal and breath sounds normal.  GI: Soft. Normal appearance and bowel sounds are normal. He exhibits no ascites. There is no tenderness. There is no CVA tenderness.  Musculoskeletal:  5 out of 5 strength to bilateral lower extremities, negative straight leg raise bilaterally.  Skin: Skin is warm and dry.           Assessment & Plan:

## 2018-11-04 NOTE — Assessment & Plan Note (Signed)
Intermittent and located to generalized abdomen, more so right upper quadrant for the last couple of weeks.  Exam today unremarkable and is without abdominal or CVA tenderness.  No urinary symptoms.  UA today unremarkable.  Check labs including CBC, CMP, A1c.  Abdominal ultrasound pending.  Differential diagnoses include IBS, cholelithiasis/cholecystitis, musculoskeletal involvement.  Will consult with GI if testing is unremarkable.  He appears stable for outpatient treatment.

## 2018-11-08 ENCOUNTER — Telehealth: Payer: Self-pay | Admitting: *Deleted

## 2018-11-08 NOTE — Telephone Encounter (Signed)
Per DPR, left detail message of Evan [name] comments for patient to call back to schedule lab appointment.

## 2018-11-08 NOTE — Telephone Encounter (Signed)
-----   Message from [name], NP sent at 11/04/2018  8:54 PM EST ----- Please notify patient:  Prediabetes: Your blood sugar levels are slightly too high. Prediabetes means that you do not have diabetes, but you are at risk for developing diabetes if you do not work to reduce your blood sugar levels. Decrease consumption of fast food, fried food, processed carbohydrates (chips, cookies, etc), sugary drinks, sweets. Increase consumption of fresh fruits and vegetables, whole grains, water. Exercise will also lower these levels.   Liver, kidenys, electrolytes look good.  No evidence of infection or anemia in the body.  I'll be in touch once I receive the ultrasound report.  Needs repeat A1C in 6 months, please schedule a lab only appointment.

## 2018-11-09 ENCOUNTER — Ambulatory Visit
Admission: RE | Admit: 2018-11-09 | Discharge: 2018-11-09 | Disposition: A | Discharge status: Home / Self Care | Payer: Managed Care, Other (non HMO) | Source: Ambulatory Visit | Attending: Primary Care | Admitting: Primary Care

## 2018-11-09 DIAGNOSIS — R1084 Generalized abdominal pain: Secondary | ICD-10-CM

## 2018-11-25 ENCOUNTER — Other Ambulatory Visit: Payer: Managed Care, Other (non HMO)

## 2018-11-25 DIAGNOSIS — R1084 Generalized abdominal pain: Secondary | ICD-10-CM

## 2018-11-30 ENCOUNTER — Telehealth: Payer: Self-pay | Admitting: Primary Care

## 2018-11-30 NOTE — Telephone Encounter (Signed)
Patient is calling back because he wanted you to know that he  only has one Kidney and you are ordering the CT scan with contrast. He isnt opposeded to taking the contrast just wanted to be sure you knew about his only one Kidney. Please message or call the patient back with what you decide\.

## 2018-12-01 NOTE — Telephone Encounter (Signed)
Spoken and notified patient of Tawni Millers comments. Patient stated that he wanted to know if it is injected or if he has to drink the contrast but its okay, he wants to proceeded.

## 2018-12-01 NOTE — Telephone Encounter (Signed)
A CT abdomen pelvis without contrast will not show [place] much detail.  We can request that the radiology department use as little contrast as possible.  He does have normal kidney function with the one kidney.  If he does not wish to proceed then I recommend he follow back up with his GI doctor for further insight into his symptoms.

## 2018-12-01 NOTE — Telephone Encounter (Signed)
Contrast is oral. Evan [name], is there a way to have the radiology technicians give him as little contrast as possible? How do I get that communication through?

## 2018-12-02 NOTE — Telephone Encounter (Signed)
Noted  

## 2018-12-02 NOTE — Telephone Encounter (Signed)
Received a return call from [place], Jerrye Beavers called [place] back to say that they will be able to give your patient a reduced dose of contrast, she wanted you to know that it will be a reduced dose study.

## 2018-12-09 ENCOUNTER — Ambulatory Visit
Admission: RE | Admit: 2018-12-09 | Discharge: 2018-12-09 | Disposition: A | Discharge status: Home / Self Care | Payer: Managed Care, Other (non HMO) | Source: Ambulatory Visit | Attending: Primary Care | Admitting: Primary Care

## 2018-12-09 DIAGNOSIS — R1084 Generalized abdominal pain: Secondary | ICD-10-CM

## 2018-12-09 MED ORDER — IOPAMIDOL (ISOVUE-300) INJECTION 61%
80.0000 mL | Freq: Once | INTRAVENOUS | Status: AC | PRN
  Administered 2018-12-09: 80 mL via INTRAVENOUS

## 2018-12-13 DIAGNOSIS — M791 Myalgia, unspecified site: Secondary | ICD-10-CM

## 2018-12-13 DIAGNOSIS — M6281 Muscle weakness (generalized): Secondary | ICD-10-CM

## 2019-02-15 ENCOUNTER — Encounter: Payer: Self-pay | Admitting: Family Medicine

## 2019-02-15 ENCOUNTER — Ambulatory Visit: Payer: 59 | Admitting: Family Medicine

## 2019-02-15 VITALS — BP 130/76 | HR 101 | Temp 98.7°F | Ht 70.0 in | Wt 289.0 lb

## 2019-02-15 DIAGNOSIS — J111 Influenza due to unidentified influenza virus with other respiratory manifestations: Secondary | ICD-10-CM

## 2019-02-15 DIAGNOSIS — R69 Illness, unspecified: Secondary | ICD-10-CM

## 2019-02-15 DIAGNOSIS — R059 Cough, unspecified: Secondary | ICD-10-CM

## 2019-02-15 DIAGNOSIS — R05 Cough: Secondary | ICD-10-CM

## 2019-02-15 LAB — POC INFLUENZA A&B (BINAX/QUICKVUE): Influenza A, POC: NEGATIVE

## 2019-02-15 MED ORDER — OSELTAMIVIR PHOSPHATE 75 MG PO CAPS
75.0000 mg | ORAL_CAPSULE | Freq: Two times a day (BID) | ORAL | 0 refills | Status: DC
Start: 2019-02-15 — End: 2019-03-01

## 2019-02-15 NOTE — Progress Notes (Signed)
Evan [name] is a 53 y.o. male here for an acute visit.  History of Present Illness:   Evan [name], CMA acting as scribe for Dr. Briscoe Deutscher.   Cough  This is a new problem. The current episode started in the past 7 days. The problem has been gradually worsening. The problem occurs constantly. The cough is productive of sputum (light green in morning only ). Associated symptoms include chills, ear pain, postnasal drip and a sore throat. He has tried rest and OTC cough suppressant for the symptoms. The treatment provided no relief. His past medical history is significant for bronchitis and pneumonia. There is no history of asthma or COPD.   PMHx, SurgHx, SocialHx, Medications, and Allergies were reviewed in the Visit Navigator and updated as appropriate.  Current Medications   .  aspirin EC 81 MG EC tablet, Take 1 tablet (81 mg total) by mouth daily., Disp: 30 tablet, Rfl: 3 .  betamethasone dipropionate (DIPROLENE) 0.05 % cream, APPLY TO AFFECTED AREA UP TO TWICE A DAY AS NEEDED. DO NOT APPLY TO FACE, GROIN, OR UNDER ARMS., Disp: , Rfl: 3 .  Cholecalciferol (VITAMIN D3) 5000 units CAPS, Take 1 capsule by mouth as directed. , Disp: , Rfl:  .  lisinopril (PRINIVIL,ZESTRIL) 20 MG tablet, Take 1 tablet (20 mg total) by mouth daily., Disp: 90 tablet, Rfl: 3 .  omeprazole (PRILOSEC) 20 MG capsule, Take 1 capsule (20 mg total) by mouth daily., Disp: 60 capsule, Rfl: 3 .  spironolactone (ALDACTONE) 25 MG tablet, Take 1 tablet (25 mg total) by mouth daily., Disp: 90 tablet, Rfl: 3  Allergies  Allergen Reactions  . Nuvigil [Armodafinil] Other (See Comments)    Seizures   . Seroquel [Quetiapine] Other (See Comments)    Seizures.  . Simvastatin Hives   Review of Systems   Pertinent items are noted in the HPI. Otherwise, ROS is negative.  Vitals   Vitals:   02/15/19 1107  BP: 130/76  Pulse: (!) 101  Temp: 98.7 F (37.1 C)  TempSrc: Oral  SpO2: 94%  Weight: 289 lb (131.1 kg)   Height: 5\' 10"  (1.778 m)     Body mass index is 41.47 kg/m.  Physical Exam   Physical Exam Vitals signs and nursing note reviewed.  Constitutional:      General: He is not in acute distress.    Appearance: He is well-developed.  HENT:     Head: Normocephalic and atraumatic.     Right Ear: External ear normal.     Left Ear: External ear normal.     Nose: Nose normal.  Eyes:     Conjunctiva/sclera: Conjunctivae normal.     Pupils: Pupils are equal, round, and reactive to light.  Neck:     Musculoskeletal: Neck supple.  Cardiovascular:     Rate and Rhythm: Normal rate and regular rhythm.  Pulmonary:     Effort: Pulmonary effort is normal.  Abdominal:     General: Bowel sounds are normal.     Palpations: Abdomen is soft.  Musculoskeletal: Normal range of motion.  Skin:    General: Skin is warm.  Neurological:     Mental Status: He is alert.  Psychiatric:        Behavior: Behavior normal.    Results for orders placed or performed in visit on 02/15/19  POC Influenza A&B (Binax test)  Result Value Ref Range   Influenza A, POC Negative Negative   Influenza B, POC     Assessment  and Plan   Evan [name] was seen today for cough.  Diagnoses and all orders for this visit:  Cough -     POC Influenza A&B (Binax test)  Influenza-like illness Comments: With direct exposure to flu patient. Patient with one kidney. Will treat. Orders: -     oseltamivir (TAMIFLU) 75 MG capsule; Take 1 capsule (75 mg total) by mouth 2 (two) times daily.   . Reviewed expectations re: course of current medical issues. . Discussed self-management of symptoms. . Outlined signs and symptoms indicating need for more acute intervention. . Patient verbalized understanding and all questions were answered. Marland Kitchen Health Maintenance issues including appropriate healthy diet, exercise, and smoking avoidance were discussed with patient. . See orders for this visit as documented in the electronic medical  record. . Patient received an After Visit Summary.  CMA served as Education administrator during this visit. History, Physical, and Plan performed by medical provider. The above documentation has been reviewed and is accurate and complete. Briscoe Deutscher, D.O.  Briscoe Deutscher, DO [place], [facility] 02/15/2019

## 2019-02-17 ENCOUNTER — Ambulatory Visit: Payer: Managed Care, Other (non HMO) | Admitting: Physician Assistant

## 2019-02-23 ENCOUNTER — Ambulatory Visit: Payer: Self-pay | Admitting: Neurology

## 2019-03-01 ENCOUNTER — Ambulatory Visit: Payer: 59 | Admitting: Family Medicine

## 2019-03-01 ENCOUNTER — Encounter: Payer: Self-pay | Admitting: Family Medicine

## 2019-03-01 ENCOUNTER — Other Ambulatory Visit: Payer: Self-pay

## 2019-03-01 ENCOUNTER — Telehealth: Payer: Self-pay | Admitting: Primary Care

## 2019-03-01 VITALS — BP 124/78 | HR 91 | Temp 98.0°F | Ht 70.0 in | Wt 290.0 lb

## 2019-03-01 DIAGNOSIS — R059 Cough, unspecified: Secondary | ICD-10-CM

## 2019-03-01 DIAGNOSIS — B9689 Other specified bacterial agents as the cause of diseases classified elsewhere: Secondary | ICD-10-CM | POA: Diagnosis not present

## 2019-03-01 DIAGNOSIS — J208 Acute bronchitis due to other specified organisms: Secondary | ICD-10-CM | POA: Diagnosis not present

## 2019-03-01 DIAGNOSIS — R05 Cough: Secondary | ICD-10-CM | POA: Diagnosis not present

## 2019-03-01 MED ORDER — AZITHROMYCIN 250 MG PO TABS: ORAL_TABLET | ORAL | 0 refills | Status: DC

## 2019-03-01 NOTE — Telephone Encounter (Signed)
Copied from [place] [id number]. Topic: Quick Communication - See Telephone Encounter >> Mar 01, 2019  1:36 PM Rutherford Nail, NT wrote: CRM for notification. See Telephone encounter for: 03/01/19. Patient calling and states that he was seen a few weeks ago (02/15/2019) with flu like symptoms. States that he was prescribed tamiflu to take for 5 days. States that he has finished the medication and it does not seem to have moved on anywhere. States that he still has the chest congestion and cough. Would like to know what Dr Juleen China suggests he do at this time? Please advise.

## 2019-03-01 NOTE — Telephone Encounter (Signed)
Questions for Screening COVID-19  Symptom onset: Patient symptoms started around 3/14  Travel or Contacts: He has not traveled but has taken an emergency trip to [place] went on Monday back last night. Symptoms have increased last night and today.   During this illness, did/does the patient experience any of the following symptoms? Fever >100.11F []   Yes [x]   No []   Unknown Subjective fever (felt feverish) []   Yes [x]   No []   Unknown Chills []   Yes [x]   No []   Unknown Muscle aches (myalgia) []   Yes [x]   No []   Unknown  Runny nose (rhinorrhea) []   Yes [x]   No []   Unknown Sore throat [x]   Yes []   No []   Unknown Cough (new onset or worsening of chronic cough) [x]   Yes []   No []   Unknown Shortness of breath (dyspnea) []   Yes [x]   No []   Unknown Nausea or vomiting []   Yes [x]   No []   Unknown Headache []   Yes [x]   No []   Unknown Abdominal pain  []   Yes [x]   No []   Unknown Diarrhea (?3 loose/looser than normal stools/24hr period) []   Yes [x]   No []   Unknown Other, specify:  Patient risk factors: Smoker? []   Current []   Former [x]   Never If male, currently pregnant? []   Yes [x]   No  Patient Active Problem List   Diagnosis Date Noted  . Generalized abdominal pain 11/04/2018  . Myalgia 11/04/2018  . Unstable angina ([place])   . Chest pain 05/03/2018  . Chest pain, moderate coronary artery risk   . 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 Essential hypertension 04/14/2017  . Hyperlipidemia 04/14/2017  . Arthralgia 04/14/2017    Plan:  []   High risk for COVID-19 with red flags go to ED (with CP, SOB, weak/lightheaded, or fever > 101.5). Call ahead.  []   High risk for COVID-19 but stable will have car visit. Inform provider and coordinate time. Will be completed in afternoon. []   No red flags but URI signs or symptoms will go through side door and be seen in dedicated room.  Note: Referral to telemedicine is an appropriate alternative disposition for higher risk but stable. Zacarias Pontes  Telehealth/e-Visit: [phone].

## 2019-03-01 NOTE — Assessment & Plan Note (Addendum)
Lungs clear today. Given duration and progression (acute worsening after initial improvement after ILI), anticipate has developed acute bacterial bronchitis. Treat with zpack. I did ask him to call us Monday with update.  History, exam, timeline not consistent with Covid 19 infection at this time. Regardless, did recommend self quarantine at home and not return to work until symptoms improving.

## 2019-03-01 NOTE — Telephone Encounter (Signed)
Noted, will have patient evaluated. Low risk for Covid-19 as [place] is not on the list for endemic regions. Also afebrile. Raquel Sarna, please schedule patient.

## 2019-03-01 NOTE — Progress Notes (Signed)
BP 124/78 (BP Location: Left Arm, Patient Position: Sitting, Cuff Size: Large)   Pulse 91   Temp 98 F (36.7 C) (Oral)   Ht 5\' 10"  (1.778 m)   Wt 290 lb (131.5 kg)   SpO2 94%   BMI 41.61 kg/m    CC: cough Subjective:    Patient ID: Evan [name], male    DOB: Mar 11, 1966, 53 y.o.   MRN: [id number]  HPI: Warren Lindahl is a 53 y.o. male presenting on 03/01/2019 for Cough (C/o cough, chest congestion, bilateral ear pain and fatigue. Sxs started about 2 wks ago. Was seen and tx with Tamiflu. Pt recently traveled to [facility]. Pt accompanied by wife, Cherylann. )   Here with wife who is also seen.   Seen by Dr Juleen China at the beginning of the month (note reviewed) treated for influenza with tamiflu because he had direct exposure to flu (his boss) although his flu swab was negative.   Never fully improved. Today started feeling worse - chest congestion, bilateral ear ache, dry mouth. Chronic headache. Some dyspnea and fatigue. Dry cough.   So far tried nyquil. No tylenol or ibuprofen.   No fevers/chills, tooth pain, HA, wheezing.  Wife sick as well.  No smokers at home.   CKD s/p L nephrectomy for atrophy and reflux 2010.   Recent sudden 1d trip to [place], [place] (there and arrived back at 1am this morning) to pick up his sister who was stuck in [facility].      Relevant past medical, surgical, family and social history reviewed and updated as indicated. Interim medical history since our last visit reviewed. Allergies and medications reviewed and updated. Outpatient Medications Prior to Visit  Medication Sig Dispense Refill  . aspirin EC 81 MG EC tablet Take 1 tablet (81 mg total) by mouth daily. 30 tablet 3  . betamethasone dipropionate (DIPROLENE) 0.05 % cream APPLY TO AFFECTED AREA UP TO TWICE A DAY AS NEEDED. DO NOT APPLY TO FACE, GROIN, OR UNDER ARMS.  3  . Cholecalciferol (VITAMIN D3) 5000 units CAPS Take 1 capsule by mouth as directed.     Marland Kitchen lisinopril (PRINIVIL,ZESTRIL) 20 MG tablet Take 1  tablet (20 mg total) by mouth daily. 90 tablet 3  . omeprazole (PRILOSEC) 20 MG capsule Take 1 capsule (20 mg total) by mouth daily. 60 capsule 3  . spironolactone (ALDACTONE) 25 MG tablet Take 1 tablet (25 mg total) by mouth daily. 90 tablet 3  . oseltamivir (TAMIFLU) 75 MG capsule Take 1 capsule (75 mg total) by mouth 2 (two) times daily. 10 capsule 0   Facility-Administered Medications Prior to Visit  Medication Dose Route Frequency Provider Last Rate Last Dose  . 0.9 %  sodium chloride infusion  500 mL Intravenous Once Doran Stabler, MD         Per HPI unless specifically indicated in ROS section below Review of Systems Objective:    BP 124/78 (BP Location: Left Arm, Patient Position: Sitting, Cuff Size: Large)   Pulse 91   Temp 98 F (36.7 C) (Oral)   Ht 5\' 10"  (1.778 m)   Wt 290 lb (131.5 kg)   SpO2 94%   BMI 41.61 kg/m   Wt Readings from Last 3 Encounters:  03/01/19 290 lb (131.5 kg)  02/15/19 289 lb (131.1 kg)  11/04/18 279 lb 8 oz (126.8 kg)    Physical Exam Vitals signs and nursing note reviewed.  Constitutional:      General: He is not in  acute distress.    Appearance: Normal appearance. He is well-developed.     Comments: Tired appearing, nontoxic  HENT:     Head: Normocephalic and atraumatic.     Right Ear: Hearing, tympanic membrane, ear canal and external ear normal.     Left Ear: Hearing, tympanic membrane, ear canal and external ear normal.     Nose: Mucosal edema (and erythema) and congestion present. No rhinorrhea.     Right Sinus: No maxillary sinus tenderness or frontal sinus tenderness.     Left Sinus: No maxillary sinus tenderness or frontal sinus tenderness.     Mouth/Throat:     Mouth: Mucous membranes are moist.     Pharynx: Uvula midline. Posterior oropharyngeal erythema present. No oropharyngeal exudate.     Tonsils: No tonsillar abscesses.  Eyes:     General: No scleral icterus.    Conjunctiva/sclera: Conjunctivae normal.     Pupils:  Pupils are equal, round, and reactive to light.  Neck:     Musculoskeletal: Normal range of motion and neck supple.  Cardiovascular:     Rate and Rhythm: Normal rate and regular rhythm.     Pulses: Normal pulses.     Heart sounds: Normal heart sounds. No murmur.  Pulmonary:     Effort: Pulmonary effort is normal. No respiratory distress.     Breath sounds: Normal breath sounds. No wheezing, rhonchi or rales.     Comments: Lungs clear. Deep dry cough present Lymphadenopathy:     Cervical: No cervical adenopathy.  Skin:    General: Skin is warm and dry.     Findings: No rash.  Neurological:     Mental Status: He is alert.        Assessment & Plan:   Problem List Items Addressed This Visit    Acute bacterial bronchitis - Primary    Lungs clear today. Given duration and progression (acute worsening after initial improvement after ILI), anticipate has developed acute bacterial bronchitis. Treat with zpack. I did ask him to call us Monday with update.  History, exam, timeline not consistent with Covid 19 infection at this time. Regardless, did recommend self quarantine at home and not return to work until symptoms improving.       Relevant Medications   azithromycin (ZITHROMAX) 250 MG tablet       Meds ordered this encounter  Medications  . azithromycin (ZITHROMAX) 250 MG tablet    Sig: Take two tablets on day one followed by one tablet on days 2-5    Dispense:  6 each    Refill:  0   No orders of the defined types were placed in this encounter.   Patient Instructions  Possible bronchitis however I do recommend staying at home until symptoms are improving. Take zpack antibiotic sent to pharmacy.  Push fluids and rest.  Call us Monday with an update. Let us know sooner if fever, or worsening productive cough.    Follow up plan: Return if symptoms worsen or fail to improve.  Ria Bush, MD

## 2019-03-01 NOTE — Telephone Encounter (Signed)
See note

## 2019-03-01 NOTE — Patient Instructions (Signed)
Possible bronchitis however I do recommend staying at home until symptoms are improving. Take zpack antibiotic sent to pharmacy.  Push fluids and rest.  Call us Monday with an update. Let us know sooner if fever, or worsening productive cough.

## 2019-03-01 NOTE — Telephone Encounter (Signed)
Please advise patient is seen by your office when called to get more information Has new symptoms of cough see note below. Patient also states wife who is seen by your office has started having new symptoms as well.

## 2019-03-06 ENCOUNTER — Ambulatory Visit: Payer: 59 | Admitting: Gastroenterology

## 2019-03-06 ENCOUNTER — Telehealth: Payer: Self-pay | Admitting: *Deleted

## 2019-03-06 MED ORDER — GUAIFENESIN-CODEINE 100-10 MG/5ML PO SYRP: 5.0000 mL | ORAL_SOLUTION | Freq: Three times a day (TID) | ORAL | 0 refills | Status: DC | PRN

## 2019-03-06 NOTE — Telephone Encounter (Signed)
Patient called stating that he was in Wednesday and given a Z-pak. Patient stated that he took the last pill yesterday. Patient stated that he has a dry cough, no fever, but his chest, back and shoulder blades are bothering him today. Patient stated that he feels a lot of congestion in his chest, but unable to get anything up.  Patient stated that he was on vacation last week, but went back to work today. Pharmacy-[facility] [address]

## 2019-03-06 NOTE — Telephone Encounter (Signed)
Spoke with patient.  Ongoing dry cough, no fever or dyspnea.  Now having back pain between shoulder blades, ongoing headache from coughing.  Returned to work today - advised to stay at home until feeling better.  I will send in cheratussin codeine cough syrup and recommend he try plain mucinex as well. Will call later this week for update.

## 2019-03-08 NOTE — Telephone Encounter (Signed)
He again went to work yesterday against my recommendation but is going to stay home for the rest of the week.  Still no fever. Some dyspnea with exertion and ongoing headache and back pain with dry cough. Continues cheratussin cough syrup.  Advised red flags to seek ER care, will call later this week to check on him. He does not have home pulse ox.

## 2019-03-10 ENCOUNTER — Telehealth: Payer: Self-pay | Admitting: Family Medicine

## 2019-03-10 NOTE — Telephone Encounter (Addendum)
Spoke w pt.  Ongoing dyspnea, dry cough, chest pressure.  Did discuss if worsening would be reasonable to seek ER care for further evaluation ?covid-19 testing.  He will have wife come home and discuss.

## 2019-03-10 NOTE — Telephone Encounter (Signed)
Patient said he spoke to Dr. Darnell Level earlier and was told to call the hospital about getting covid-19 testing done.  Patient was told all the [facility] aren't doing testing because there are no test kits available.  They only test patients that have been admitted to the hospital.  Patient said he didn't want to wait in the ER.  He said he and his wife will self-quarantine unless his breathing gets worse.

## 2019-03-10 NOTE — Telephone Encounter (Signed)
Noted. Spoke to patient about this. He will stay out of work.  Advised if worsening symptoms to seek care at [facility] or ER over weekend. He agrees.

## 2019-03-15 ENCOUNTER — Telehealth: Payer: 59 | Admitting: Nurse Practitioner

## 2019-03-15 DIAGNOSIS — J069 Acute upper respiratory infection, unspecified: Secondary | ICD-10-CM

## 2019-03-15 MED ORDER — FLUTICASONE PROPIONATE 50 MCG/ACT NA SUSP: 2.0000 | Freq: Every day | NASAL | 6 refills | Status: DC

## 2019-03-15 MED ORDER — BENZONATATE 100 MG PO CAPS: 100.0000 mg | ORAL_CAPSULE | Freq: Three times a day (TID) | ORAL | 0 refills | Status: DC | PRN

## 2019-03-15 NOTE — Progress Notes (Signed)
We are sorry you are not feeling well.  Here is how we plan to help!  Based on what you have shared with me, it looks like you may have a viral upper respiratory infection.  Upper respiratory infections are caused by a large number of viruses; however, rhinovirus is the most common cause.   Symptoms vary from person to person, with common symptoms including sore throat, cough, and fatigue or lack of energy.  A low-grade fever of up to 100.4 may present, but is often uncommon.  Symptoms vary however, and are closely related to a person's age or underlying illnesses.  The most common symptoms associated with an upper respiratory infection are nasal discharge or congestion, cough, sneezing, headache and pressure in the ears and face.  These symptoms usually persist for about 3 to 10 days, but can last up to 2 weeks.  It is important to know that upper respiratory infections do not cause serious illness or complications in most cases.    Upper respiratory infections can be transmitted from person to person, with the most common method of transmission being a person's hands.  The virus is able to live on the skin and can infect other persons for up to 2 hours after direct contact.  Also, these can be transmitted when someone coughs or sneezes; thus, it is important to cover the mouth to reduce this risk.  To keep the spread of the illness at bay, good hand hygiene is very important.  This is an infection that is most likely caused by a virus. There are no specific treatments other than to help you with the symptoms until the infection runs its course.  We are sorry you are not feeling well.  Here is how we plan to help!   For nasal congestion, you may use an oral decongestants such as Mucinex D or if you have glaucoma or high blood pressure use plain Mucinex.  Saline nasal spray or nasal drops can help and can safely be used as often as needed for congestion.  For your congestion, I have prescribed Fluticasone  nasal spray one spray in each nostril twice a day  If you do not have a history of heart disease, hypertension, diabetes or thyroid disease, prostate/bladder issues or glaucoma, you may also use Sudafed to treat nasal congestion.  It is highly recommended that you consult with a pharmacist or your primary care physician to ensure this medication is safe for you to take.     If you have a cough, you may use cough suppressants such as Delsym and Robitussin.  If you have glaucoma or high blood pressure, you can also use Coricidin HBP.   For cough I have prescribed for you A prescription cough medication called Tessalon Perles 100 mg. You may take 1-2 capsules every 8 hours as needed for cough  If you have a sore or scratchy throat, use a saltwater gargle-  to  teaspoon of salt dissolved in a 4-ounce to 8-ounce glass of warm water.  Gargle the solution for approximately 15-30 seconds and then spit.  It is important not to swallow the solution.  You can also use throat lozenges/cough drops and Chloraseptic spray to help with throat pain or discomfort.  Warm or cold liquids can also be helpful in relieving throat pain.  For headache, pain or general discomfort, you can use Ibuprofen or Tylenol as directed.   Some authorities believe that zinc sprays or the use of Echinacea may shorten the   course of your symptoms.   HOME CARE . Only take medications as instructed by your medical team. . Be sure to drink plenty of fluids. Water is fine as well as fruit juices, sodas and electrolyte beverages. You may want to stay away from caffeine or alcohol. If you are nauseated, try taking small sips of liquids. How do you know if you are getting enough fluid? Your urine should be a pale yellow or almost colorless. . Get rest. . Taking a steamy shower or using a humidifier may help nasal congestion and ease sore throat pain. You can place a towel over your head and breathe in the steam from hot water coming from a  faucet. . Using a saline nasal spray works much the same way. . Cough drops, hard candies and sore throat lozenges may ease your cough. . Avoid close contacts especially the very young and the elderly . Cover your mouth if you cough or sneeze . Always remember to wash your hands.   GET HELP RIGHT AWAY IF: . You develop worsening fever. . If your symptoms do not improve within 10 days . You develop yellow or green discharge from your nose over 3 days. . You have coughing fits . You develop a severe head ache or visual changes. . You develop shortness of breath, difficulty breathing or start having chest pain . Your symptoms persist after you have completed your treatment plan  MAKE SURE YOU   Understand these instructions.  Will watch your condition.  Will get help right away if you are not doing well or get worse.  Your e-visit answers were reviewed by a board certified advanced clinical practitioner to complete your personal care plan. Depending upon the condition, your plan could have included both over the counter or prescription medications. Please review your pharmacy choice. If there is a problem, you may call our nursing hot line at and have the prescription routed to another pharmacy. Your safety is important to us. If you have drug allergies check your prescription carefully.   You can use MyChart to ask questions about today's visit, request a non-urgent call back, or ask for a work or school excuse for 24 hours related to this e-Visit. If it has been greater than 24 hours you will need to follow up with your provider, or enter a new e-Visit to address those concerns. You will get an e-mail in the next two days asking about your experience.  I hope that your e-visit has been valuable and will speed your recovery. Thank you for using e-visits.   5 minutes spent reviewing and documenting in chart.    

## 2019-03-22 ENCOUNTER — Telehealth: Payer: Self-pay | Admitting: Family Medicine

## 2019-03-22 NOTE — Telephone Encounter (Signed)
Left message on vm per dpr relaying Dr. Synthia Innocent message.  Asked pt to call back with answers to Dr. Maryfrances Bunnell.

## 2019-03-22 NOTE — Telephone Encounter (Signed)
Patient called back and he is feeling better. He went to a walk in clinic. He did get chest xray and was tested for COVID which was negative. He went there last Tuesday or Wednesday , he just got the results back today.

## 2019-03-22 NOTE — Telephone Encounter (Signed)
Seen here last month with respiratory symptoms with prolonged recovery.  plz call for update - how is he feeling, how is cough and shortness of breath? Any fever? Is he back at work? Thanks.

## 2019-03-23 NOTE — Telephone Encounter (Signed)
Noted, glad to hear. 

## 2019-03-23 NOTE — Telephone Encounter (Signed)
Great to hear! Thanks.

## 2019-03-30 ENCOUNTER — Ambulatory Visit: Payer: 59 | Admitting: Gastroenterology

## 2019-05-11 ENCOUNTER — Other Ambulatory Visit: Payer: Self-pay | Admitting: Cardiology

## 2019-05-11 ENCOUNTER — Ambulatory Visit: Payer: 59 | Admitting: Gastroenterology

## 2019-05-11 DIAGNOSIS — I10 Essential (primary) hypertension: Secondary | ICD-10-CM

## 2019-06-09 ENCOUNTER — Ambulatory Visit: Payer: 59 | Admitting: Gastroenterology

## 2019-07-28 ENCOUNTER — Other Ambulatory Visit: Payer: Self-pay | Admitting: Cardiology

## 2019-07-28 DIAGNOSIS — I10 Essential (primary) hypertension: Secondary | ICD-10-CM

## 2019-08-07 ENCOUNTER — Other Ambulatory Visit: Payer: Self-pay | Admitting: Cardiology

## 2019-08-07 DIAGNOSIS — I10 Essential (primary) hypertension: Secondary | ICD-10-CM

## 2019-09-04 ENCOUNTER — Encounter: Payer: Self-pay | Admitting: Family Medicine

## 2019-09-04 ENCOUNTER — Other Ambulatory Visit: Payer: Self-pay

## 2019-09-04 ENCOUNTER — Ambulatory Visit (INDEPENDENT_AMBULATORY_CARE_PROVIDER_SITE_OTHER): Payer: No Typology Code available for payment source | Admitting: Family Medicine

## 2019-09-04 ENCOUNTER — Telehealth: Payer: Self-pay

## 2019-09-04 ENCOUNTER — Other Ambulatory Visit: Payer: Self-pay | Admitting: Primary Care

## 2019-09-04 VITALS — BP 122/86 | HR 86 | Temp 97.9°F | Ht 70.0 in | Wt 296.2 lb

## 2019-09-04 DIAGNOSIS — M791 Myalgia, unspecified site: Secondary | ICD-10-CM

## 2019-09-04 DIAGNOSIS — E538 Deficiency of other specified B group vitamins: Secondary | ICD-10-CM | POA: Diagnosis not present

## 2019-09-04 DIAGNOSIS — R5382 Chronic fatigue, unspecified: Secondary | ICD-10-CM | POA: Diagnosis not present

## 2019-09-04 DIAGNOSIS — R29898 Other symptoms and signs involving the musculoskeletal system: Secondary | ICD-10-CM

## 2019-09-04 DIAGNOSIS — R5383 Other fatigue: Secondary | ICD-10-CM | POA: Insufficient documentation

## 2019-09-04 DIAGNOSIS — E559 Vitamin D deficiency, unspecified: Secondary | ICD-10-CM | POA: Diagnosis not present

## 2019-09-04 LAB — VITAMIN B12: Vitamin B-12: 196 pg/mL — ABNORMAL LOW (ref 211–911)

## 2019-09-04 LAB — TSH: TSH: 2.09 u[IU]/mL (ref 0.35–4.50)

## 2019-09-04 LAB — CBC WITH DIFFERENTIAL/PLATELET
Basophils Absolute: 0.1 10*3/uL (ref 0.0–0.1)
Basophils Relative: 1 % (ref 0.0–3.0)
Eosinophils Absolute: 0.3 10*3/uL (ref 0.0–0.7)
Eosinophils Relative: 3.6 % (ref 0.0–5.0)
HCT: 46.4 % (ref 39.0–52.0)
Hemoglobin: 16.1 g/dL (ref 13.0–17.0)
Lymphocytes Relative: 29.2 % (ref 12.0–46.0)
Lymphs Abs: 2.5 10*3/uL (ref 0.7–4.0)
MCHC: 34.7 g/dL (ref 30.0–36.0)
MCV: 91.8 fl (ref 78.0–100.0)
Monocytes Absolute: 0.9 10*3/uL (ref 0.1–1.0)
Monocytes Relative: 11 % (ref 3.0–12.0)
Neutro Abs: 4.7 10*3/uL (ref 1.4–7.7)
Neutrophils Relative %: 55.2 % (ref 43.0–77.0)
Platelets: 275 10*3/uL (ref 150.0–400.0)
RBC: 5.05 Mil/uL (ref 4.22–5.81)
RDW: 13.7 % (ref 11.5–15.5)
WBC: 8.6 10*3/uL (ref 4.0–10.5)

## 2019-09-04 LAB — COMPREHENSIVE METABOLIC PANEL
ALT: 33 U/L (ref 0–53)
AST: 30 U/L (ref 0–37)
Albumin: 4.2 g/dL (ref 3.5–5.2)
Alkaline Phosphatase: 45 U/L (ref 39–117)
BUN: 14 mg/dL (ref 6–23)
CO2: 29 mEq/L (ref 19–32)
Calcium: 9.7 mg/dL (ref 8.4–10.5)
Chloride: 100 mEq/L (ref 96–112)
Creatinine, Ser: 1.05 mg/dL (ref 0.40–1.50)
GFR: 73.29 mL/min (ref >60.00)
Glucose, Bld: 102 mg/dL — ABNORMAL HIGH (ref 70–99)
Potassium: 4.6 mEq/L (ref 3.5–5.1)
Sodium: 136 mEq/L (ref 135–145)
Total Bilirubin: 0.5 mg/dL (ref 0.2–1.2)
Total Protein: 6.8 g/dL (ref 6.0–8.3)

## 2019-09-04 LAB — VITAMIN D 25 HYDROXY (VIT D DEFICIENCY, FRACTURES): VITD: 23.03 ng/mL — ABNORMAL LOW (ref 30.00–100.00)

## 2019-09-04 LAB — SEDIMENTATION RATE: Sed Rate: 16 mm/hr (ref 0–20)

## 2019-09-04 LAB — CK: Total CK: 778 U/L — ABNORMAL HIGH (ref 7–232)

## 2019-09-04 NOTE — Assessment & Plan Note (Signed)
Level today Fatigue and myalgias  He takes 5000 iu 2-3 times per week

## 2019-09-04 NOTE — Assessment & Plan Note (Signed)
Ongoing-worse lately  Pt feels heavy sensation in legs  Also some intermittent numbness/tingling He worries about MS as it runs in family  Will approach pcp re: ref to neuro ESR and CPK added to labs today as well

## 2019-09-04 NOTE — Progress Notes (Signed)
Subjective:    Patient ID: Evan [name], male    DOB: November 30, 1966, 53 y.o.   MRN: [id number]  HPI 53 yo pt of NP Clark here with difficulty walking  Started Friday Had a headache  Legs have been hurting L leg is baseline bigger (neg for blood clot 4 times)  From the knees down he has a lot of pain -esp the back Some burning   Very fatigued   Was planning on seeing neurology for myalgias  Had a pos lyme test in [place] before he moved here  He was tx with an experimental/homeopathic med (it helped)   Some RUQ pain -still bothers him  Had neg [place] and may need HIDA scan   occ vision is blurry Headaches top of head  Trouble swallowing at times  This comes and goes   Has brother with MS    Wt Readings from Last 3 Encounters:  09/04/19 296 lb 4 oz (134.4 kg)  03/01/19 290 lb (131.5 kg)  02/15/19 289 lb (131.1 kg)  has gained weight and does snore  42.51 kg/m   HTN BP Readings from Last 3 Encounters:  09/04/19 122/86  03/01/19 124/78  02/15/19 130/76   Pulse Readings from Last 3 Encounters:  09/04/19 86  03/01/19 91  02/15/19 (!) 101     H/o low D and B12 in 2018 D 15.7 B12 261 (low normal)  Taking vitamin D 2-3 times per week - 5000 iu  Takes mvi   Patient Active Problem List   Diagnosis Date Noted  . Vitamin D deficiency 09/04/2019  . B12 deficiency 09/04/2019  . Fatigue 09/04/2019  . Generalized abdominal pain 11/04/2018  . Myalgia 11/04/2018  . Unstable angina ([place])   . Chest pain 05/03/2018  . Chest pain, moderate coronary artery risk   . 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 Essential hypertension 04/14/2017  . Hyperlipidemia 04/14/2017  . Arthralgia 04/14/2017   Past Medical History:  Diagnosis Date  . Arthritis   . Chronic kidney disease   . Colon polyps    benign per pt  . Diverticulitis   . Hyperlipidemia   . Hypertension   . Lyme disease    Past Surgical History:  Procedure Laterality Date  . HERNIA REPAIR     x 2  . LEFT HEART  CATH AND CORONARY ANGIOGRAPHY N/A 05/04/2018   Procedure: LEFT HEART CATH AND CORONARY ANGIOGRAPHY;  Surgeon: Leonie Man, MD;  Location: [place] [facility];  Service: Cardiovascular;  Laterality: N/A;  . NECK SURGERY    . NEPHRECTOMY Left 2010   for atrophy and reflux  . SHOULDER SURGERY Left    Social History   Tobacco Use  . Smoking status: Never Smoker  . Smokeless tobacco: Never Used  Substance Use Topics  . Alcohol use: Yes    Comment: occasional  . Drug use: No   Family History  Problem Relation Age of Onset  . Arthritis Mother   . Heart disease Mother   . Hypertension Mother   . Kidney disease Mother   . Colon cancer Father        dx in his early 81's  . Lung cancer Father   . Prostate cancer Father   . Breast cancer Father        37 or 57  . Lung cancer Brother        smoker  . Lung cancer Paternal Aunt   . Colon cancer Paternal Uncle   . Diabetes  Paternal Uncle   . Arthritis Maternal Grandmother   . Colon cancer Maternal Grandmother   . Heart disease Maternal Grandmother   . Heart disease Maternal Grandfather   . Diabetes Paternal Grandfather   . Lung cancer Brother        smoker  . Bladder Cancer Brother    Allergies  Allergen Reactions  . Nuvigil [Armodafinil] Other (See Comments)    Seizures   . Seroquel [Quetiapine] Other (See Comments)    Seizures.  . Simvastatin Hives   Current Outpatient Medications on File Prior to Visit  Medication Sig Dispense Refill  . betamethasone dipropionate (DIPROLENE) 0.05 % cream APPLY TO AFFECTED AREA UP TO TWICE A DAY AS NEEDED. DO NOT APPLY TO FACE, GROIN, OR UNDER ARMS.  3  . Cholecalciferol (VITAMIN D3) 5000 units CAPS Take 1 capsule by mouth as directed.     Marland Kitchen lisinopril (ZESTRIL) 20 MG tablet Take 1 tablet (20 mg total) by mouth daily. Please make overdue appt with Dr. Marlou Porch before anymore refills. 2nd attempt 15 tablet 0  . omeprazole (PRILOSEC) 20 MG capsule Take 1 capsule (20 mg total) by mouth  daily. 60 capsule 3  . spironolactone (ALDACTONE) 25 MG tablet Take 1 tablet (25 mg total) by mouth daily. Please make overdue appt with Dr. Marlou Porch before anymore refills. 2nd attempt 90 tablet 0  . aspirin EC 81 MG EC tablet Take 1 tablet (81 mg total) by mouth daily. (Patient not taking: Reported on 09/04/2019) 30 tablet 3  . fluticasone (FLONASE) 50 MCG/ACT nasal spray Place 2 sprays into both nostrils daily. (Patient not taking: Reported on 09/04/2019) 16 g 6   Current Facility-Administered Medications on File Prior to Visit  Medication Dose Route Frequency Provider Last Rate Last Dose  . 0.9 %  sodium chloride infusion  500 mL Intravenous Once Doran Stabler, MD         Review of Systems  Constitutional: Positive for fatigue. Negative for activity change, appetite change, diaphoresis, fever and unexpected weight change.  HENT: Negative for congestion, rhinorrhea, sore throat and trouble swallowing.   Eyes: Negative for pain, redness, itching and visual disturbance.  Respiratory: Negative for cough, chest tightness, shortness of breath and wheezing.   Cardiovascular: Negative for chest pain and palpitations.  Gastrointestinal: Negative for abdominal pain, blood in stool, constipation, diarrhea and nausea.  Endocrine: Negative for cold intolerance, heat intolerance, polydipsia and polyuria.  Genitourinary: Negative for difficulty urinating, dysuria, frequency and urgency.  Musculoskeletal: Positive for arthralgias, gait problem and myalgias. Negative for joint swelling.  Skin: Negative for pallor and rash.  Neurological: Positive for weakness and numbness. Negative for dizziness, tremors, seizures, syncope, facial asymmetry, speech difficulty, light-headedness and headaches.  Hematological: Negative for adenopathy. Does not bruise/bleed easily.  Psychiatric/Behavioral: Negative for confusion, decreased concentration and dysphoric mood. The patient is not nervous/anxious.         Objective:   Physical Exam Constitutional:      General: He is not in acute distress.    Appearance: Normal appearance. He is obese. He is not ill-appearing.  HENT:     Head: Normocephalic and atraumatic.     Mouth/Throat:     Mouth: Mucous membranes are moist.     Pharynx: Oropharynx is clear.  Eyes:     General: No scleral icterus.       Right eye: No discharge.        Left eye: No discharge.     Extraocular Movements: Extraocular movements  intact.     Conjunctiva/sclera: Conjunctivae normal.     Pupils: Pupils are equal, round, and reactive to light.  Neck:     Musculoskeletal: Normal range of motion and neck supple. No neck rigidity or muscular tenderness.     Vascular: No carotid bruit.  Cardiovascular:     Rate and Rhythm: Normal rate and regular rhythm.     Pulses: Normal pulses.     Heart sounds: Normal heart sounds.  Pulmonary:     Effort: Pulmonary effort is normal. No respiratory distress.     Breath sounds: Normal breath sounds. No wheezing or rales.     Comments: Good air exch Abdominal:     General: Bowel sounds are normal.     Palpations: Abdomen is soft. There is no mass.  Musculoskeletal:     Right lower leg: Edema present.     Left lower leg: Edema present.     Comments: Bilateral pedal edema -mildly worse in L leg (baseline)   Lymphadenopathy:     Cervical: No cervical adenopathy.  Skin:    General: Skin is warm and dry.     Coloration: Skin is not pale.     Findings: No erythema or rash.  Neurological:     Mental Status: He is alert.     Cranial Nerves: No cranial nerve deficit.     Coordination: Coordination normal.     Gait: Gait abnormal.     Deep Tendon Reflexes: Reflexes normal.     Comments: Wide based shuffling gait  Pt states his legs feel heavy   No obvious focal weakness    Psychiatric:        Mood and Affect: Mood normal.           Assessment & Plan:   Problem List Items Addressed This Visit      Other   Myalgia -  Primary    Ongoing-worse lately  Pt feels heavy sensation in legs  Also some intermittent numbness/tingling He worries about MS as it runs in family  Will approach pcp re: ref to neuro ESR and CPK added to labs today as well      Relevant Orders   Sedimentation Rate   CK   CBC with Differential/Platelet   Vitamin D deficiency    Level today Fatigue and myalgias  He takes 5000 iu 2-3 times per week       Relevant Orders   VITAMIN D 25 Hydroxy (Vit-D Deficiency, Fractures)   B12 deficiency    With fatigue and some numbness/tingling in feet  Level drawn      Relevant Orders   Vitamin B12   Fatigue    With ongoing myalgias  Also obesity/snoring -may consider eval for sleep apnea in the future   Lab today incl D and B12 levels cmet and cbc and tsh      Relevant Orders   Comprehensive metabolic panel   CBC with Differential/Platelet   TSH

## 2019-09-04 NOTE — Assessment & Plan Note (Signed)
With ongoing myalgias  Also obesity/snoring -may consider eval for sleep apnea in the future   Lab today incl D and B12 levels cmet and cbc and tsh

## 2019-09-04 NOTE — Telephone Encounter (Signed)
Pt has had problems for couple of days with moving his legs, they feel heavy and pt does not feel like he can control his legs very well. Pt has difficulty in walking; pt is shuffling his feet and lt leg is swollen. No CP. Pt does not want to go to ED. I spoke with Gentry Fitz NP who cannot add on anyone today but thinks if pt refuses ED can come in for appt. Pt scheduled 30' appt with Dr Glori Bickers today at 10:45; pt has SOB on and off for 6 mths,pt thought due to possible pneumonia, H/A yesterday, dizziness on and off and muscle pain all the time but pt has Lymes. No other covid symptoms, no travel and no known exposure to covid. ED precautions given and pt voiced understanding.

## 2019-09-04 NOTE — Patient Instructions (Signed)
Labs today for fatigue and muscle pain  Also B12 and vitamin D (especially with your numbness issue)   I will talk to Evan [name] about a neurology referral   In the meantime, cut portions for weight loss

## 2019-09-04 NOTE — Assessment & Plan Note (Signed)
With fatigue and some numbness/tingling in feet  Level drawn

## 2019-09-06 NOTE — Telephone Encounter (Signed)
Dr Glori Bickers saw pt on 09/04/19.

## 2019-09-07 ENCOUNTER — Encounter: Payer: Self-pay | Admitting: Emergency Medicine

## 2019-09-07 ENCOUNTER — Emergency Department: Payer: No Typology Code available for payment source

## 2019-09-07 ENCOUNTER — Other Ambulatory Visit: Payer: Self-pay

## 2019-09-07 ENCOUNTER — Emergency Department
Admission: EM | Admit: 2019-09-07 | Discharge: 2019-09-07 | Disposition: A | Discharge status: Home / Self Care | Payer: No Typology Code available for payment source | Attending: Emergency Medicine | Admitting: Emergency Medicine

## 2019-09-07 DIAGNOSIS — I129 Hypertensive chronic kidney disease with stage 1 through stage 4 chronic kidney disease, or unspecified chronic kidney disease: Secondary | ICD-10-CM | POA: Diagnosis not present

## 2019-09-07 DIAGNOSIS — R748 Abnormal levels of other serum enzymes: Secondary | ICD-10-CM | POA: Insufficient documentation

## 2019-09-07 DIAGNOSIS — N189 Chronic kidney disease, unspecified: Secondary | ICD-10-CM | POA: Diagnosis not present

## 2019-09-07 DIAGNOSIS — R531 Weakness: Secondary | ICD-10-CM

## 2019-09-07 HISTORY — DX: Unspecified convulsions: R56.9

## 2019-09-07 LAB — COMPREHENSIVE METABOLIC PANEL
ALT: 36 U/L (ref 0–44)
AST: 30 U/L (ref 15–41)
Albumin: 4.2 g/dL (ref 3.5–5.0)
Alkaline Phosphatase: 50 U/L (ref 38–126)
Anion gap: 10 (ref 5–15)
BUN: 14 mg/dL (ref 6–20)
CO2: 22 mmol/L (ref 22–32)
Calcium: 9.5 mg/dL (ref 8.9–10.3)
Chloride: 103 mmol/L (ref 98–111)
Creatinine, Ser: 1 mg/dL (ref 0.61–1.24)
GFR calc Af Amer: >60 mL/min (ref >60)
GFR calc non Af Amer: >60 mL/min (ref >60)
Glucose, Bld: 144 mg/dL — ABNORMAL HIGH (ref 70–99)
Potassium: 4 mmol/L (ref 3.5–5.1)
Sodium: 135 mmol/L (ref 135–145)
Total Bilirubin: 0.7 mg/dL (ref 0.3–1.2)
Total Protein: 7.7 g/dL (ref 6.5–8.1)

## 2019-09-07 LAB — CK: Total CK: 531 U/L — ABNORMAL HIGH (ref 49–397)

## 2019-09-07 LAB — CBC
HCT: 48.7 % (ref 39.0–52.0)
Hemoglobin: 16.9 g/dL (ref 13.0–17.0)
MCH: 31 pg (ref 26.0–34.0)
MCHC: 34.7 g/dL (ref 30.0–36.0)
MCV: 89.4 fL (ref 80.0–100.0)
Platelets: 283 10*3/uL (ref 150–400)
RBC: 5.45 MIL/uL (ref 4.22–5.81)
RDW: 13.1 % (ref 11.5–15.5)
WBC: 9 10*3/uL (ref 4.0–10.5)
nRBC: 0 % (ref 0.0–0.2)

## 2019-09-07 LAB — URINALYSIS, COMPLETE (UACMP) WITH MICROSCOPIC
Bacteria, UA: NONE SEEN
Bilirubin Urine: NEGATIVE
Glucose, UA: NEGATIVE mg/dL
Hgb urine dipstick: NEGATIVE
Ketones, ur: NEGATIVE mg/dL
Leukocytes,Ua: NEGATIVE
Nitrite: NEGATIVE
Protein, ur: NEGATIVE mg/dL
Specific Gravity, Urine: 1.02 (ref 1.005–1.030)
pH: 6 (ref 5.0–8.0)

## 2019-09-07 LAB — TROPONIN I (HIGH SENSITIVITY): Troponin I (High Sensitivity): 10 ng/L (ref <18)

## 2019-09-07 LAB — LIPASE, BLOOD: Lipase: 34 U/L (ref 11–51)

## 2019-09-07 MED ORDER — SODIUM CHLORIDE 0.9% FLUSH: 3.0000 mL | Freq: Once | INTRAVENOUS | Status: DC

## 2019-09-07 NOTE — ED Triage Notes (Signed)
Says since Friday doesn't feel balanced and the top of head hurts and feels burny.  Says he has been confused.  Last night poured creamer in his water.  Says seen on Monday by pcp.  Says ck was abnormal, but she thought it was musculoskelatal and she is arranging neurology follow up.  Also has pain upper right quad after eating or drinking--has not been able tosee gi since covid happened.

## 2019-09-07 NOTE — ED Notes (Signed)
Family at bedside. 

## 2019-09-07 NOTE — ED Triage Notes (Signed)
Pt reports started not feeling well Friday and couldn't move legs so went to see his MD Monday and she did blood work. Pt reports per MD his CK is high and was advised to see a neurologist. Pt reports he called her back today because he still feels weak and his legs are hurting bad and he is dizzy and has a headache.

## 2019-09-07 NOTE — ED Provider Notes (Signed)
[facility] Emergency Department Provider Note       Time seen: ----------------------------------------- 11:09 AM on 09/07/2019 -----------------------------------------   I have reviewed the triage vital signs and the nursing notes.  HISTORY   Chief Complaint Weakness    HPI Evan [name] is a 53 y.o. male with a history of arthritis, chronic kidney disease, diverticulitis, hyperlipidemia, hypertension, seizures who presents to the ED for weakness, and confusion.  Patient states last night he poured creamer into his water rather than where it was supposed to go.  He was seen Monday by his primary care doctor and told his CK level is elevated.  He has had some right upper quadrant pain with eating or drinking.  Past Medical History:  Diagnosis Date  . Arthritis   . Chronic kidney disease   . Colon polyps    benign per pt  . Diverticulitis   . Hyperlipidemia   . Hypertension   . Lyme disease   . Seizures [facility]     Patient Active Problem List   Diagnosis Date Noted  . Vitamin D deficiency 09/04/2019  . B12 deficiency 09/04/2019  . Fatigue 09/04/2019  . Generalized abdominal pain 11/04/2018  . Myalgia 11/04/2018  . Unstable angina ([place])   . Chest pain 05/03/2018  . Chest pain, moderate coronary artery risk   . 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 Essential hypertension 04/14/2017  . Hyperlipidemia 04/14/2017  . Arthralgia 04/14/2017    Past Surgical History:  Procedure Laterality Date  . HERNIA REPAIR     x 2  . LEFT HEART CATH AND CORONARY ANGIOGRAPHY N/A 05/04/2018   Procedure: LEFT HEART CATH AND CORONARY ANGIOGRAPHY;  Surgeon: Leonie Man, MD;  Location: [place] [facility];  Service: Cardiovascular;  Laterality: N/A;  . NECK SURGERY    . NEPHRECTOMY Left 2010   for atrophy and reflux  . SHOULDER SURGERY Left     Allergies Nuvigil [armodafinil], Seroquel [quetiapine], and Simvastatin  Social History Social History    Tobacco Use  . Smoking status: Never Smoker  . Smokeless tobacco: Never Used  Substance Use Topics  . Alcohol use: Yes    Comment: occasional  . Drug use: No   Review of Systems Constitutional: Negative for fever. Cardiovascular: Negative for chest pain. Respiratory: Negative for shortness of breath. Gastrointestinal: Negative for abdominal pain, vomiting and diarrhea. Musculoskeletal: Negative for back pain. Skin: Negative for rash. Neurological: Positive for weakness with intermittent confusion  All systems negative/normal/unremarkable except as stated in the HPI  ____________________________________________   PHYSICAL EXAM:  VITAL SIGNS: ED Triage Vitals  Enc Vitals Group     BP 09/07/19 1038 127/88     Pulse Rate 09/07/19 1038 (!) 103     Resp 09/07/19 1038 16     Temp 09/07/19 1038 98.7 F (37.1 C)     Temp Source 09/07/19 1038 Oral     SpO2 09/07/19 1038 93 %     Weight 09/07/19 1042 296 lb 1.2 oz (134.3 kg)     Height 09/07/19 1042 5\' 10"  (1.778 m)     Head Circumference --      Peak Flow --      Pain Score 09/07/19 1041 5     Pain Loc --      Pain Edu? --      Excl. in [place]? --    Constitutional: Alert and oriented. Well appearing and in no distress. Eyes: Conjunctivae are normal. Normal extraocular movements. ENT  Head: Normocephalic and atraumatic.      Nose: No congestion/rhinnorhea.      Mouth/Throat: Mucous membranes are moist.      Neck: No stridor. Cardiovascular: Normal rate, regular rhythm. No murmurs, rubs, or gallops. Respiratory: Normal respiratory effort without tachypnea nor retractions. Breath sounds are clear and equal bilaterally. No wheezes/rales/rhonchi. Gastrointestinal: Soft and nontender. Normal bowel sounds Musculoskeletal: Nontender with normal range of motion in extremities. No lower extremity tenderness nor edema. Neurologic:  Normal speech and language. No gross focal neurologic deficits are appreciated.  Skin:  Skin is  warm, dry and intact. No rash noted. Psychiatric: Mood and affect are normal. Speech and behavior are normal.  ____________________________________________  EKG: Interpreted by me.  Sinus tachycardia with a rate of 101 bpm, left anterior fascicular block, septal infarct, normal QT  Repeat EKG interpreted by me, sinus rhythm rate 84 bpm, left anterior fascicular block, normal QT ____________________________________________  ED COURSE:  As part of my medical decision making, I reviewed the following data within the [place] History obtained from family if available, nursing notes, old chart and ekg, as well as notes from prior ED visits. Patient presented for weakness and confusion, we will assess with labs and imaging as indicated at this time.   Procedures  Evan [name] was evaluated in Emergency Department on 09/07/2019 for the symptoms described in the history of present illness. He was evaluated in the context of the global COVID-19 pandemic, which necessitated consideration that the patient might be at risk for infection with the SARS-CoV-2 virus that causes COVID-19. Institutional protocols and algorithms that pertain to the evaluation of patients at risk for COVID-19 are in a state of rapid change based on information released by regulatory bodies including the CDC and federal and state organizations. These policies and algorithms were followed during the patient's care in the ED.  ____________________________________________   LABS (pertinent positives/negatives)  Labs Reviewed  COMPREHENSIVE METABOLIC PANEL - Abnormal; Notable for the following components:      Result Value   Glucose, Bld 144 (*)    All other components within normal limits  URINALYSIS, COMPLETE (UACMP) WITH MICROSCOPIC - Abnormal; Notable for the following components:   Color, Urine YELLOW (*)    APPearance CLEAR (*)    All other components within normal limits  CK - Abnormal; Notable for  the following components:   Total CK 531 (*)    All other components within normal limits  LIPASE, BLOOD  CBC  TROPONIN I (HIGH SENSITIVITY)    RADIOLOGY Images were viewed by me  CT head IMPRESSION: Negative head CT. IMPRESSION: No evidence of cholelithiasis.  Heterogeneous liver, compatible with steatosis or other medical liver disease  ____________________________________________   DIFFERENTIAL DIAGNOSIS   Dehydration, electrolyte abnormality, rhabdomyolysis, occult infection, anxiety, depression, sleep apnea  FINAL ASSESSMENT AND PLAN  Weakness, elevated CK level   Plan: The patient had presented for weakness and confusion. Patient's labs were grossly unremarkable with the exception of an elevated CK level that is mild. Patient's imaging not reveal any acute process.  Patient symptoms appear to be multifactorial.  He will be referred for a sleep study and overall appears to be under significant stress.  He is otherwise cleared for outpatient follow-up.   Laurence Aly, MD    Note: This note was generated in part or whole with voice recognition software. Voice recognition is usually quite accurate but there are transcription errors that can and very often do occur. I  apologize for any typographical errors that were not detected and corrected.     Earleen Newport, MD 09/07/19 [phone]

## 2019-09-14 NOTE — Telephone Encounter (Signed)
Evan [name], can you take a look at his neurology referral? He can go anywhere.

## 2019-10-09 DIAGNOSIS — I10 Essential (primary) hypertension: Secondary | ICD-10-CM

## 2019-10-10 MED ORDER — LISINOPRIL 20 MG PO TABS: 20.0000 mg | ORAL_TABLET | Freq: Every day | ORAL | 0 refills | Status: DC

## 2019-10-13 ENCOUNTER — Other Ambulatory Visit: Payer: Self-pay | Admitting: *Deleted

## 2019-10-13 ENCOUNTER — Ambulatory Visit (INDEPENDENT_AMBULATORY_CARE_PROVIDER_SITE_OTHER): Payer: No Typology Code available for payment source | Admitting: Family Medicine

## 2019-10-13 ENCOUNTER — Encounter: Payer: Self-pay | Admitting: Family Medicine

## 2019-10-13 VITALS — Ht 70.0 in | Wt 289.0 lb

## 2019-10-13 DIAGNOSIS — Z20822 Contact with and (suspected) exposure to covid-19: Secondary | ICD-10-CM

## 2019-10-13 DIAGNOSIS — R0989 Other specified symptoms and signs involving the circulatory and respiratory systems: Secondary | ICD-10-CM | POA: Diagnosis not present

## 2019-10-13 MED ORDER — ALBUTEROL SULFATE HFA 108 (90 BASE) MCG/ACT IN AERS: 1.0000 | INHALATION_SPRAY | Freq: Four times a day (QID) | RESPIRATORY_TRACT | 1 refills | Status: DC | PRN

## 2019-10-13 NOTE — Progress Notes (Signed)
Interactive audio and video telecommunications were attempted between this provider and patient, however failed, due to patient having technical difficulties OR patient did not have access to video capability.  We continued and completed visit with audio only.   Virtual Visit via Telephone Note  I connected with patient on 10/13/19  at 12:21 PM  by telephone and verified that I am speaking with the correct person using two identifiers.  Location of patient: home  Location of MD: [place] Name of referring provider (if blank then none associated): Names per persons and role in encounter:  MD: Earlyne Iba, Patient: name listed above.    I discussed the limitations, risks, security and privacy concerns of performing an evaluation and management service by telephone and the availability of in person appointments. I also discussed with the patient that there may be a patient responsible charge related to this service. The patient expressed understanding and agreed to proceed.  CC:uri sx.   History of Present Illness: sx started 2 nights ago.  ST and ear pain.  Then had fatigue.  HA.  Cough with sputum.  Rhinorrhea is clear.  Clearly sputum.  Taking OTC cough medicine.  He is off from work today and tomorrow.  He is usually not around a lot of people at work but he may still have some possible exposures at work.  He has been wearing a mask at work.  He has f/u pending re: baseline SOB but he doesn't have new SOB.  No change in taste or smell.     Observations/Objective: nad Speech wnl  Assessment and Plan: URI sx.   Rest, fluids, tylenol prn, SABA prn.  Will send for covid testing.   At this point still okay for outpatient follow-up.  Routine cautions given.  He agrees.  Follow Up Instructions: See above   I discussed the assessment and treatment plan with the patient. The patient was provided an opportunity to ask questions and all were answered. The patient agreed with the plan  and demonstrated an understanding of the instructions.   The patient was advised to call back or seek an in-person evaluation if the symptoms worsen or if the condition fails to improve as anticipated.  I provided 15 minutes of non-face-to-face time during this encounter.  Elsie Stain, MD

## 2019-10-14 LAB — NOVEL CORONAVIRUS, NAA: SARS-CoV-2, NAA: NOT DETECTED

## 2019-10-16 DIAGNOSIS — R0989 Other specified symptoms and signs involving the circulatory and respiratory systems: Secondary | ICD-10-CM | POA: Insufficient documentation

## 2019-10-16 NOTE — Assessment & Plan Note (Signed)
Rest, fluids, tylenol prn, SABA prn.  Will send for covid testing.   At this point still okay for outpatient follow-up.  Routine cautions given.  He agrees.

## 2019-10-19 ENCOUNTER — Other Ambulatory Visit: Payer: Self-pay | Admitting: Neurology

## 2019-10-19 DIAGNOSIS — G35 Multiple sclerosis: Secondary | ICD-10-CM

## 2019-10-20 ENCOUNTER — Ambulatory Visit: Payer: No Typology Code available for payment source | Admitting: Family Medicine

## 2019-10-21 ENCOUNTER — Other Ambulatory Visit: Payer: Self-pay | Admitting: Cardiology

## 2019-11-01 ENCOUNTER — Other Ambulatory Visit: Payer: Self-pay | Admitting: Primary Care

## 2019-11-01 DIAGNOSIS — I10 Essential (primary) hypertension: Secondary | ICD-10-CM

## 2019-11-02 ENCOUNTER — Other Ambulatory Visit: Payer: Self-pay

## 2019-11-02 ENCOUNTER — Ambulatory Visit (INDEPENDENT_AMBULATORY_CARE_PROVIDER_SITE_OTHER): Payer: No Typology Code available for payment source | Admitting: Primary Care

## 2019-11-02 ENCOUNTER — Encounter: Payer: Self-pay | Admitting: Primary Care

## 2019-11-02 VITALS — BP 126/78 | HR 86 | Temp 97.8°F | Ht 70.0 in | Wt 292.8 lb

## 2019-11-02 DIAGNOSIS — Z0000 Encounter for general adult medical examination without abnormal findings: Secondary | ICD-10-CM | POA: Insufficient documentation

## 2019-11-02 DIAGNOSIS — E785 Hyperlipidemia, unspecified: Secondary | ICD-10-CM

## 2019-11-02 DIAGNOSIS — R1084 Generalized abdominal pain: Secondary | ICD-10-CM

## 2019-11-02 DIAGNOSIS — R748 Abnormal levels of other serum enzymes: Secondary | ICD-10-CM | POA: Diagnosis not present

## 2019-11-02 DIAGNOSIS — Z125 Encounter for screening for malignant neoplasm of prostate: Secondary | ICD-10-CM | POA: Diagnosis not present

## 2019-11-02 DIAGNOSIS — E559 Vitamin D deficiency, unspecified: Secondary | ICD-10-CM | POA: Diagnosis not present

## 2019-11-02 DIAGNOSIS — I200 Unstable angina: Secondary | ICD-10-CM | POA: Diagnosis not present

## 2019-11-02 DIAGNOSIS — M791 Myalgia, unspecified site: Secondary | ICD-10-CM

## 2019-11-02 DIAGNOSIS — R1011 Right upper quadrant pain: Secondary | ICD-10-CM

## 2019-11-02 DIAGNOSIS — Z0001 Encounter for general adult medical examination with abnormal findings: Secondary | ICD-10-CM | POA: Insufficient documentation

## 2019-11-02 DIAGNOSIS — E538 Deficiency of other specified B group vitamins: Secondary | ICD-10-CM

## 2019-11-02 DIAGNOSIS — I10 Essential (primary) hypertension: Secondary | ICD-10-CM

## 2019-11-02 LAB — COMPREHENSIVE METABOLIC PANEL
ALT: 29 U/L (ref 0–53)
AST: 22 U/L (ref 0–37)
Albumin: 4.2 g/dL (ref 3.5–5.2)
Alkaline Phosphatase: 46 U/L (ref 39–117)
BUN: 15 mg/dL (ref 6–23)
CO2: 30 mEq/L (ref 19–32)
Calcium: 9.4 mg/dL (ref 8.4–10.5)
Chloride: 100 mEq/L (ref 96–112)
Creatinine, Ser: 1.12 mg/dL (ref 0.40–1.50)
GFR: 67.99 mL/min (ref >60.00)
Glucose, Bld: 127 mg/dL — ABNORMAL HIGH (ref 70–99)
Potassium: 4.7 mEq/L (ref 3.5–5.1)
Sodium: 136 mEq/L (ref 135–145)
Total Bilirubin: 0.5 mg/dL (ref 0.2–1.2)
Total Protein: 6.8 g/dL (ref 6.0–8.3)

## 2019-11-02 LAB — CBC
HCT: 46.6 % (ref 39.0–52.0)
Hemoglobin: 16 g/dL (ref 13.0–17.0)
MCHC: 34.3 g/dL (ref 30.0–36.0)
MCV: 91.5 fl (ref 78.0–100.0)
Platelets: 257 10*3/uL (ref 150.0–400.0)
RBC: 5.09 Mil/uL (ref 4.22–5.81)
RDW: 14.1 % (ref 11.5–15.5)
WBC: 7.5 10*3/uL (ref 4.0–10.5)

## 2019-11-02 LAB — LIPID PANEL
Cholesterol: 269 mg/dL — ABNORMAL HIGH (ref 0–200)
HDL: 35.3 mg/dL — ABNORMAL LOW (ref >39.00)
LDL Cholesterol: 201 mg/dL — ABNORMAL HIGH (ref 0–99)
NonHDL: 234.11
Total CHOL/HDL Ratio: 8
Triglycerides: 166 mg/dL — ABNORMAL HIGH (ref 0.0–149.0)
VLDL: 33.2 mg/dL (ref 0.0–40.0)

## 2019-11-02 LAB — PSA: PSA: 0.39 ng/mL (ref 0.10–4.00)

## 2019-11-02 LAB — CK: Total CK: 493 U/L — ABNORMAL HIGH (ref 7–232)

## 2019-11-02 LAB — VITAMIN D 25 HYDROXY (VIT D DEFICIENCY, FRACTURES): VITD: 25.13 ng/mL — ABNORMAL LOW (ref 30.00–100.00)

## 2019-11-02 LAB — HEMOGLOBIN A1C: Hgb A1c MFr Bld: 6.5 % (ref 4.6–6.5)

## 2019-11-02 LAB — VITAMIN B12: Vitamin B-12: 203 pg/mL — ABNORMAL LOW (ref 211–911)

## 2019-11-02 MED ORDER — LISINOPRIL 20 MG PO TABS: 20.0000 mg | ORAL_TABLET | Freq: Every day | ORAL | 3 refills | Status: DC

## 2019-11-02 NOTE — Assessment & Plan Note (Addendum)
Stable in the office today, continue to monitor.  Continue current regimen.

## 2019-11-02 NOTE — Assessment & Plan Note (Signed)
Compliant to vitamin D as recommended. Repeat D level pending.

## 2019-11-02 NOTE — Assessment & Plan Note (Signed)
Intermittent symptoms.  Compliant to spironolactone, he plans on scheduling an appointment with Dr. Marlou Porch soon.

## 2019-11-02 NOTE — Assessment & Plan Note (Signed)
Chronic, ongoing. Evaluated by GI, has undergone two abdominal ultrasounds and a CT scan without obvious cause for pain. Also undergone upper endoscopy.  HIDA scan pending given negative results elsewhere. Await results.

## 2019-11-02 NOTE — Assessment & Plan Note (Signed)
Due for tetanus, declines today. Declines influenza vaccination and Shingrix. PSA pending. Colonoscopy completed in 2014, may be due. He will check his records. Strongly advised he work on a healthy diet and start exercising. Exam today stable. Labs pending.

## 2019-11-02 NOTE — Assessment & Plan Note (Signed)
Chronic and continued. Repeat CK pending. Following with neurology.

## 2019-11-02 NOTE — Patient Instructions (Signed)
Stop by the lab prior to leaving today. I will notify you of your results once received.   You will be contacted regarding your HIDA scan.  Please let us know if you have not been contacted within two weeks.   Start exercising. You should be getting 150 minutes of moderate intensity exercise weekly.  Continue to work on a healthy diet. Ensure you are consuming 64 ounces of water daily.  Please update me regarding your colonoscopy due date as discussed.  It was a pleasure to see you today!   Preventive Care 16-51 Years Old, Male Preventive care refers to lifestyle choices and visits with your health care provider that can promote health and wellness. This includes:  A yearly physical exam. This is also called an annual well check.  Regular dental and eye exams.  Immunizations.  Screening for certain conditions.  Healthy lifestyle choices, such as eating a healthy diet, getting regular exercise, not using drugs or products that contain nicotine and tobacco, and limiting alcohol use. What can I expect for my preventive care visit? Physical exam Your health care provider will check:  Height and weight. These may be used to calculate body mass index (BMI), which is a measurement that tells if you are at a healthy weight.  Heart rate and blood pressure.  Your skin for abnormal spots. Counseling Your health care provider may ask you questions about:  Alcohol, tobacco, and drug use.  Emotional well-being.  Home and relationship well-being.  Sexual activity.  Eating habits.  Work and work Statistician. What immunizations do I need?  Influenza (flu) vaccine  This is recommended every year. Tetanus, diphtheria, and pertussis (Tdap) vaccine  You may need a Td booster every 10 years. Varicella (chickenpox) vaccine  You may need this vaccine if you have not already been vaccinated. Zoster (shingles) vaccine  You may need this after age 54. Measles, mumps, and rubella  (MMR) vaccine  You may need at least one dose of MMR if you were born in 1957 or later. You may also need a second dose. Pneumococcal conjugate (PCV13) vaccine  You may need this if you have certain conditions and were not previously vaccinated. Pneumococcal polysaccharide (PPSV23) vaccine  You may need one or two doses if you smoke cigarettes or if you have certain conditions. Meningococcal conjugate (MenACWY) vaccine  You may need this if you have certain conditions. Hepatitis A vaccine  You may need this if you have certain conditions or if you travel or work in places where you may be exposed to hepatitis A. Hepatitis B vaccine  You may need this if you have certain conditions or if you travel or work in places where you may be exposed to hepatitis B. Haemophilus influenzae type b (Hib) vaccine  You may need this if you have certain risk factors. Human papillomavirus (HPV) vaccine  If recommended by your health care provider, you may need three doses over 6 months. You may receive vaccines as individual doses or as more than one vaccine together in one shot (combination vaccines). Talk with your health care provider about the risks and benefits of combination vaccines. What tests do I need? Blood tests  Lipid and cholesterol levels. These may be checked every 5 years, or more frequently if you are over 57 years old.  Hepatitis C test.  Hepatitis B test. Screening  Lung cancer screening. You may have this screening every year starting at age 33 if you have a 30-pack-year history of smoking  and currently smoke or have quit within the past 15 years.  Prostate cancer screening. Recommendations will vary depending on your family history and other risks.  Colorectal cancer screening. All adults should have this screening starting at age 6 and continuing until age 92. Your health care provider may recommend screening at age 29 if you are at increased risk. You will have tests  every 1-10 years, depending on your results and the type of screening test.  Diabetes screening. This is done by checking your blood sugar (glucose) after you have not eaten for a while (fasting). You may have this done every 1-3 years.  Sexually transmitted disease (STD) testing. Follow these instructions at home: Eating and drinking  Eat a diet that includes fresh fruits and vegetables, whole grains, lean protein, and low-fat dairy products.  Take vitamin and mineral supplements as recommended by your health care provider.  Do not drink alcohol if your health care provider tells you not to drink.  If you drink alcohol: ? Limit how much you have to 0-2 drinks a day. ? Be aware of how much alcohol is in your drink. In the [place] one drink equals one 12 oz bottle of beer (355 mL), one 5 oz glass of wine (148 mL), or one 1 oz glass of hard liquor (44 mL). Lifestyle  Take daily care of your teeth and gums.  Stay active. Exercise for at least 30 minutes on 5 or more days each week.  Do not use any products that contain nicotine or tobacco, such as cigarettes, e-cigarettes, and chewing tobacco. If you need help quitting, ask your health care provider.  If you are sexually active, practice safe sex. Use a condom or other form of protection to prevent STIs (sexually transmitted infections).  Talk with your health care provider about taking a low-dose aspirin every day starting at age 57. What's next?  Go to your health care provider once a year for a well check visit.  Ask your health care provider how often you should have your eyes and teeth checked.  Stay up to date on all vaccines. This information is not intended to replace advice given to you by your health care provider. Make sure you discuss any questions you have with your health care provider. Document Released: 12/27/2015 Document Revised: 11/24/2018 Document Reviewed: 11/24/2018 Elsevier Patient Education  2020 [employer].

## 2019-11-02 NOTE — Assessment & Plan Note (Signed)
Repeat B12 pending 

## 2019-11-02 NOTE — Progress Notes (Signed)
Subjective:    Patient ID: Evan [name], male    DOB: [birth year], 53 y.o.   MRN: [id number]  HPI  Evan [name] is a 53 year old male who presents today for complete physical.  He would also like to discuss chronic abdominal pain. Located to the right upper abdomen with radiation to right lateral side and right back. Symptoms are worse with eating and drinking. Certain food and drink make it worse (milk, coffee, creamy drinks, starchy/fatty foods). He describes his pain as a "burning". He's undergone two RUQ abdominal ultrasounds since November 2019, both of which were negative. Also evaluated by GI last year. He's never had a HIDA. He did undergo upper endoscopy in early 2019 which was grossly unremarkable.   Immunizations: -Tetanus: Unsure, declines  -Influenza: Declines -Shingles: Declines   Diet: He endorses a healthy diet. Mostly eating chicken, vegetables, beef, salads, soup. Drinking water, un-sweet tea, juice. Desserts infrequently.  Exercise: He is not exercising.   Eye exam: Completed in January 2020 Dental exam: Completes semi-annually   Colonoscopy: Completed in 2014, he is unsure of the next due date. He will call his insurance.  PSA: Due today Hep C Screen: Negative  BP Readings from Last 3 Encounters:  11/02/19 126/78  09/07/19 134/78  09/04/19 122/86   Wt Readings from Last 3 Encounters:  11/02/19 292 lb 12 oz (132.8 kg)  10/13/19 289 lb (131.1 kg)  09/07/19 296 lb 1.2 oz (134.3 kg)     Review of Systems  Constitutional: Negative for unexpected weight change.  HENT: Negative for rhinorrhea.   Respiratory: Positive for shortness of breath. Negative for cough.        Exertional dyspnea   Cardiovascular: Negative for chest pain.  Gastrointestinal: Positive for abdominal pain. Negative for constipation, diarrhea and vomiting.  Genitourinary: Negative for difficulty urinating and flank pain.  Musculoskeletal: Positive for myalgias.  Skin: Negative for rash.   Allergic/Immunologic: Negative for environmental allergies.  Neurological: Negative for dizziness, numbness and headaches.       Lower extremity weakness  Psychiatric/Behavioral: The patient is not nervous/anxious.        Past Medical History:  Diagnosis Date  . Arthritis   . Chronic kidney disease   . Colon polyps    benign per pt  . Diverticulitis   . Hyperlipidemia   . Hypertension   . Lyme disease   . Seizures ([place])      Social History   Socioeconomic History  . Marital status: Married    Spouse name: Not on file  . Number of children: 4  . Years of education: Not on file  . Highest education level: Not on file  Occupational History  . Occupation: Freight forwarder  Social Needs  . Financial resource strain: Not on file  . Food insecurity    Worry: Not on file    Inability: Not on file  . Transportation needs    Medical: Not on file    Non-medical: Not on file  Tobacco Use  . Smoking status: Never Smoker  . Smokeless tobacco: Never Used  Substance and Sexual Activity  . Alcohol use: Yes    Comment: occasional  . Drug use: No  . Sexual activity: Not on file  Lifestyle  . Physical activity    Days per week: Not on file    Minutes per session: Not on file  . Stress: Not on file  Relationships  . Social connections    Talks on phone: Not on file  Gets together: Not on file    Attends religious service: Not on file    Active member of club or organization: Not on file    Attends meetings of clubs or organizations: Not on file    Relationship status: Not on file  . Intimate partner violence    Fear of current or ex partner: Not on file    Emotionally abused: Not on file    Physically abused: Not on file    Forced sexual activity: Not on file  Other Topics Concern  . Not on file  Social History Narrative  . Not on file    Past Surgical History:  Procedure Laterality Date  . HERNIA REPAIR     x 2  . LEFT HEART CATH AND CORONARY ANGIOGRAPHY N/A 05/04/2018    Procedure: LEFT HEART CATH AND CORONARY ANGIOGRAPHY;  Surgeon: Leonie Man, MD;  Location: [facility];  Service: Cardiovascular;  Laterality: N/A;  . NECK SURGERY    . NEPHRECTOMY Left 2010   for atrophy and reflux  . PROSTATE BIOPSY    . SHOULDER SURGERY Left     Family History  Problem Relation Age of Onset  . Arthritis Mother   . Heart disease Mother   . Hypertension Mother   . Kidney disease Mother   . Colon cancer Father        dx in his early 106's  . Lung cancer Father   . Prostate cancer Father   . Breast cancer Father        75 or 6  . Lung cancer Brother        smoker  . Lung cancer Paternal Aunt   . Colon cancer Paternal Uncle   . Diabetes Paternal Uncle   . Arthritis Maternal Grandmother   . Colon cancer Maternal Grandmother   . Heart disease Maternal Grandmother   . Heart disease Maternal Grandfather   . Diabetes Paternal Grandfather   . Lung cancer Brother        smoker  . Bladder Cancer Brother     Allergies  Allergen Reactions  . Nuvigil [Armodafinil] Other (See Comments)    Seizures   . Seroquel [Quetiapine] Other (See Comments)    Seizures.  . Simvastatin Hives    Current Outpatient Medications on File Prior to Visit  Medication Sig Dispense Refill  . aspirin EC 81 MG EC tablet Take 1 tablet (81 mg total) by mouth daily. 30 tablet 3  . betamethasone dipropionate (DIPROLENE) 0.05 % cream APPLY TO AFFECTED AREA UP TO TWICE A DAY AS NEEDED. DO NOT APPLY TO FACE, GROIN, OR UNDER ARMS.  3  . Cholecalciferol (VITAMIN D3) 5000 units CAPS Take 1 capsule by mouth as directed.     Marland Kitchen omeprazole (PRILOSEC) 20 MG capsule Take 1 capsule (20 mg total) by mouth daily. 60 capsule 3  . spironolactone (ALDACTONE) 25 MG tablet Take 1 tablet (25 mg total) by mouth daily. Please call and schedule an appt for further 3rd attempt 15 tablet 0   No current facility-administered medications on file prior to visit.     BP 126/78   Pulse 86   Temp  97.8 F (36.6 C) (Temporal)   Ht 5\' 10"  (1.778 m)   Wt 292 lb 12 oz (132.8 kg)   SpO2 98%   BMI 42.01 kg/m    Objective:   Physical Exam  Constitutional: He is oriented to person, place, and time. He appears well-nourished.  HENT:  Right Ear: Tympanic membrane and ear canal normal.  Left Ear: Tympanic membrane and ear canal normal.  Mouth/Throat: Oropharynx is clear and moist.  Eyes: Pupils are equal, round, and reactive to light. EOM are normal.  Neck: Neck supple.  Cardiovascular: Normal rate and regular rhythm.  Respiratory: Effort normal and breath sounds normal.  GI: Soft. Bowel sounds are normal. There is no abdominal tenderness.  Musculoskeletal: Normal range of motion.  Neurological: He is alert and oriented to person, place, and time. No cranial nerve deficit.  Reflex Scores:      Patellar reflexes are 2+ on the right side and 2+ on the left side. Skin: Skin is warm and dry.  Psychiatric: He has a normal mood and affect.           Assessment & Plan:

## 2019-11-02 NOTE — Assessment & Plan Note (Signed)
Repeat lipid panel pending. 

## 2019-11-07 ENCOUNTER — Other Ambulatory Visit: Payer: Self-pay | Admitting: Cardiology

## 2019-11-12 ENCOUNTER — Encounter (HOSPITAL_COMMUNITY): Payer: Self-pay | Admitting: Emergency Medicine

## 2019-11-12 ENCOUNTER — Emergency Department (HOSPITAL_COMMUNITY)
Admission: EM | Admit: 2019-11-12 | Discharge: 2019-11-12 | Disposition: A | Discharge status: Home / Self Care | Payer: 59 | Attending: Emergency Medicine | Admitting: Emergency Medicine

## 2019-11-12 ENCOUNTER — Emergency Department (HOSPITAL_COMMUNITY): Payer: 59

## 2019-11-12 DIAGNOSIS — I129 Hypertensive chronic kidney disease with stage 1 through stage 4 chronic kidney disease, or unspecified chronic kidney disease: Secondary | ICD-10-CM | POA: Insufficient documentation

## 2019-11-12 DIAGNOSIS — R1011 Right upper quadrant pain: Secondary | ICD-10-CM | POA: Diagnosis present

## 2019-11-12 DIAGNOSIS — Z79899 Other long term (current) drug therapy: Secondary | ICD-10-CM | POA: Insufficient documentation

## 2019-11-12 DIAGNOSIS — R110 Nausea: Secondary | ICD-10-CM | POA: Diagnosis not present

## 2019-11-12 DIAGNOSIS — N189 Chronic kidney disease, unspecified: Secondary | ICD-10-CM | POA: Insufficient documentation

## 2019-11-12 LAB — COMPREHENSIVE METABOLIC PANEL
ALT: 35 U/L (ref 0–44)
AST: 31 U/L (ref 15–41)
Albumin: 3.7 g/dL (ref 3.5–5.0)
Alkaline Phosphatase: 47 U/L (ref 38–126)
Anion gap: 9 (ref 5–15)
BUN: 12 mg/dL (ref 6–20)
CO2: 25 mmol/L (ref 22–32)
Calcium: 9.3 mg/dL (ref 8.9–10.3)
Chloride: 101 mmol/L (ref 98–111)
Creatinine, Ser: 1.14 mg/dL (ref 0.61–1.24)
GFR calc Af Amer: >60 mL/min (ref >60)
GFR calc non Af Amer: >60 mL/min (ref >60)
Glucose, Bld: 177 mg/dL — ABNORMAL HIGH (ref 70–99)
Potassium: 4.6 mmol/L (ref 3.5–5.1)
Sodium: 135 mmol/L (ref 135–145)
Total Bilirubin: 0.7 mg/dL (ref 0.3–1.2)
Total Protein: 6.7 g/dL (ref 6.5–8.1)

## 2019-11-12 LAB — URINALYSIS, ROUTINE W REFLEX MICROSCOPIC
Bilirubin Urine: NEGATIVE
Glucose, UA: NEGATIVE mg/dL
Hgb urine dipstick: NEGATIVE
Ketones, ur: NEGATIVE mg/dL
Leukocytes,Ua: NEGATIVE
Nitrite: NEGATIVE
Protein, ur: NEGATIVE mg/dL
Specific Gravity, Urine: 1.016 (ref 1.005–1.030)
pH: 6 (ref 5.0–8.0)

## 2019-11-12 LAB — CBC
HCT: 46.6 % (ref 39.0–52.0)
Hemoglobin: 16.3 g/dL (ref 13.0–17.0)
MCH: 31.8 pg (ref 26.0–34.0)
MCHC: 35 g/dL (ref 30.0–36.0)
MCV: 91 fL (ref 80.0–100.0)
Platelets: 243 10*3/uL (ref 150–400)
RBC: 5.12 MIL/uL (ref 4.22–5.81)
RDW: 13.1 % (ref 11.5–15.5)
WBC: 8.7 10*3/uL (ref 4.0–10.5)
nRBC: 0 % (ref 0.0–0.2)

## 2019-11-12 LAB — LIPASE, BLOOD: Lipase: 28 U/L (ref 11–51)

## 2019-11-12 LAB — TROPONIN I (HIGH SENSITIVITY): Troponin I (High Sensitivity): 12 ng/L (ref <18)

## 2019-11-12 MED ORDER — SODIUM CHLORIDE 0.9% FLUSH: 3.0000 mL | Freq: Once | INTRAVENOUS | Status: DC

## 2019-11-12 NOTE — ED Provider Notes (Signed)
[facility] DEPART[facility] Provider Note   CSN: [id number] Arrival date & time: 11/12/19  [id number]     History   Chief Complaint Chief Complaint  Patient presents with  . Abdominal Pain  . Chest Pain    HPI Evan [name] is a 53 y.o. 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  Pain typically occurs after meals and radiates to his right shoulder blade. Patient rates pain 3/10.  He has had 2 right upper quadrant ultrasounds which were negative for gallstones.  He is scheduled for a HIDA scan on December 4.  Pain is relieved by lying on his left side. Patient notes pain has worsened over the past few days which prompted him to go to the ED today. Patient was seeing Dr. Loletha Carrow with GI pre-COVID, but has not been back since COVID started. Patient notes his bowel movements have been pretty regular except for the past few days he notes them to be a little harder than normal. His last BM was this morning. Patient denies fever, chills, vomiting, diarrhea, and chest pain. He notes chronic SOB for the past few months. Patient notes he has gained about 50lbs over the past year.    Past Medical History:  Diagnosis Date  . Arthritis   . Chronic kidney disease   . Colon polyps    benign per pt  . Diverticulitis   . Hyperlipidemia   . Hypertension   . Lyme disease   . Seizures [facility])     Patient Active Problem List   Diagnosis Date Noted  . Preventative health care 11/02/2019  . Upper respiratory symptom 10/16/2019  . Vitamin D deficiency 09/04/2019  . B12 deficiency 09/04/2019  . Fatigue 09/04/2019  . Generalized abdominal pain 11/04/2018  . Myalgia  11/04/2018  . Unstable angina ([place])   . Chest pain, moderate coronary artery risk   . 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 Essential hypertension 04/14/2017  . Hyperlipidemia 04/14/2017  . Arthralgia 04/14/2017    Past Surgical History:  Procedure Laterality Date  . HERNIA REPAIR     x 2  . LEFT HEART CATH AND CORONARY ANGIOGRAPHY N/A 05/04/2018   Procedure: LEFT HEART CATH AND CORONARY ANGIOGRAPHY;  Surgeon: Leonie Man, MD;  Location: [facility];  Service: Cardiovascular;  Laterality: N/A;  . NECK SURGERY    . NEPHRECTOMY Left 2010   for atrophy and reflux  . PROSTATE BIOPSY    . SHOULDER SURGERY Left         Home Medications    Prior to Admission medications   Medication Sig Start Date End Date Taking? Authorizing Provider  albuterol (VENTOLIN HFA) 108 (90 Base) MCG/ACT inhaler Inhale 1-2 puffs into the lungs every 4 (four) hours as needed for shortness of breath. 11/09/19  Yes [provider]  betamethasone dipropionate (DIPROLENE) 0.05 % cream Apply 1 application topically daily.  02/16/18  Yes [provider]  Cholecalciferol (VITAMIN D3) 5000 units CAPS Take 1 capsule by mouth as directed.    Yes [provider]  diphenhydrAMINE (BENADRYL) 25 MG tablet Take 25 mg by mouth daily.   Yes [provider]  lisinopril (ZESTRIL) 20 MG tablet Take 1 tablet (20 mg  total) by mouth daily. For blood pressure. 11/02/19  Yes [name], NP  omeprazole (PRILOSEC) 20 MG capsule Take 1 capsule (20 mg total) by mouth daily. 05/04/18  Yes Danford, Suann Larry, MD  spironolactone (ALDACTONE) 25 MG tablet Take 1 tablet (25 mg total) by mouth daily. Please call and schedule an appt for further 3rd attempt 10/23/19  Yes Jerline Pain, MD  aspirin EC 81 MG EC tablet Take 1 tablet (81 mg total) by mouth daily. Patient not taking: Reported on 11/12/2019 05/05/18   Edwin Dada, MD    Family History Family History  Problem Relation  Age of Onset  . Arthritis Mother   . Heart disease Mother   . Hypertension Mother   . Kidney disease Mother   . Colon cancer Father        dx in his early 7's  . Lung cancer Father   . Prostate cancer Father   . Breast cancer Father        83 or 59  . Lung cancer Brother        smoker  . Lung cancer Paternal Aunt   . Colon cancer Paternal Uncle   . Diabetes Paternal Uncle   . Arthritis Maternal Grandmother   . Colon cancer Maternal Grandmother   . Heart disease Maternal Grandmother   . Heart disease Maternal Grandfather   . Diabetes Paternal Grandfather   . Lung cancer Brother        smoker  . Bladder Cancer Brother     Social History Social History   Tobacco Use  . Smoking status: Never Smoker  . Smokeless tobacco: Never Used  Substance Use Topics  . Alcohol use: Yes    Comment: occasional  . Drug use: No     Allergies   Nuvigil [armodafinil], Seroquel [quetiapine], and Simvastatin   Review of Systems Review of Systems  Constitutional: Negative for chills and fever.  Respiratory: Positive for shortness of breath (chronic).   Cardiovascular: Negative for chest pain and leg swelling.  Gastrointestinal: Positive for abdominal distention, abdominal pain and nausea. Negative for diarrhea and vomiting.  Musculoskeletal: Positive for back pain.  All other systems reviewed and are negative.    Physical Exam Updated Vital Signs BP 126/70 (BP Location: Right Arm)   Pulse 80   Temp 98.2 F (36.8 C) (Oral)   Resp 20   SpO2 96%   Physical Exam Vitals signs and nursing note reviewed.  Constitutional:      General: He is not in acute distress.    Appearance: He is not toxic-appearing.  HENT:     Head: Normocephalic.  Eyes:     General: No scleral icterus.    Conjunctiva/sclera: Conjunctivae normal.  Neck:     Musculoskeletal: Neck supple.  Cardiovascular:     Rate and Rhythm: Normal rate and regular rhythm.     Pulses: Normal pulses.     Heart  sounds: Normal heart sounds. No murmur. No friction rub. No gallop.   Pulmonary:     Effort: Pulmonary effort is normal.     Breath sounds: Normal breath sounds.  Abdominal:     General: Abdomen is flat. Bowel sounds are normal. There is no distension.     Palpations: Abdomen is soft.     Tenderness: There is abdominal tenderness. There is no right CVA tenderness, left CVA tenderness, guarding or rebound.     Comments: Diffuse abdominal tenderness, most significant in RUQ. Negative Murphy's sign. Negative  Mcburney's point tenderness. No guarding. No rebound.   Musculoskeletal:     Comments: Able to move all 4 extremities without difficulty. No lower extremity edema. Distal sensation and pulses intact bilaterally.  Skin:    General: Skin is warm and dry.  Neurological:     General: No focal deficit present.     Mental Status: He is alert.      ED Treatments / Results  Labs (all labs ordered are listed, but only abnormal results are displayed) Labs Reviewed  COMPREHENSIVE METABOLIC PANEL - Abnormal; Notable for the following components:      Result Value   Glucose, Bld 177 (*)    All other components within normal limits  LIPASE, BLOOD  CBC  URINALYSIS, ROUTINE W REFLEX MICROSCOPIC  TROPONIN I (HIGH SENSITIVITY)    EKG None  Radiology Dg Chest 2 View  Result Date: 11/12/2019 CLINICAL DATA:  RIGHT upper quadrant abdominal pain radiating to RIGHT shoulder. EXAM: CHEST - 2 VIEW COMPARISON:  Chest x-ray dated 05/03/2018 FINDINGS: The heart size and mediastinal contours are within normal limits. Both lungs are clear. The visualized skeletal structures are unremarkable. IMPRESSION: No active cardiopulmonary disease.  No evidence of pneumonia. Electronically Signed   By: Franki Cabot M.D.   On: 11/12/2019 10:12   US Abdomen Limited Ruq  Result Date: 11/12/2019 CLINICAL DATA:  Upper abdominal pain EXAM: ULTRASOUND ABDOMEN LIMITED RIGHT UPPER QUADRANT COMPARISON:  September 07, 2019 FINDINGS: Gallbladder: No gallstones or wall thickening visualized. There is no pericholecystic fluid. No sonographic Murphy sign noted by sonographer. Common bile duct: Diameter: 6 mm. No intrahepatic or extrahepatic biliary duct dilatation. Liver: No focal lesion identified. Liver echogenicity remains increased diffusely period. Portal vein is patent on color Doppler imaging with normal direction of blood flow towards the liver. Other: None. IMPRESSION: Diffuse increase in liver echogenicity, a finding most likely indicative of hepatic steatosis. Underlying parenchymal liver disease cannot be excluded. While no focal liver lesions are evident on this study, it must be cautioned that the sensitivity of ultrasound for detection of focal liver lesions is diminished in this circumstance. Study otherwise unremarkable. Electronically Signed   By: Lowella Grip III M.D.   On: 11/12/2019 11:49    Procedures Procedures (including critical care time)  Medications Ordered in ED Medications - No data to display   Initial Impression / Assessment and Plan / ED Course  I have reviewed the triage vital signs and the nursing notes.  Pertinent labs & imaging results that were available during my care of the patient were reviewed by me and considered in my medical decision making (see chart for details).  Clinical Course as of Nov 12 128  Nancy Fetter Nov 12, 2019  1407 Troponin I (High Sensitivity): 12 [CC]    Clinical Course User Index [CC] Jonette Eva, [place]      53 year old male presents for an evaluation of right upper quadrant pain associated with nausea.  Vitals all within normal limits. Patient is afebrile, nonseptic, and non-toxic appearing.  Abdomen soft, nondistended with diffuse tenderness most significantly in right upper quadrant.  Negative Murphy sign.  No focal tenderness at McBurney's point. Will order RUQ ultrasound to rule out gallbladder etiology.   CBC unremarkable with no  leukocytosis.  CMP reassuring with only an elevation in glucose at 177. UA without signs of infection. Troponin negative. Chest x-ray personally reviewed which is negative for signs of infection. EKG reviewed which demonstrates normal sinus rhythm with no signs of  ischemia. RUQ personally reviewed which demonstrates:  Diffuse increase in liver echogenicity, a finding most likely  indicative of hepatic steatosis. Underlying parenchymal liver  disease cannot be excluded. While no focal liver lesions are evident  on this study, it must be cautioned that the sensitivity of  ultrasound for detection of focal liver lesions is diminished in  this circumstance.   No concern for cholecystitis or common bile duct obstruction given [place] results. Dissection was considered less likely given patient's HPI and physical exam. Patient declines pain management at this time. Will discharge patient home. Patient advised to follow-up with the HIDA scan on Friday. [facility] number given to patient at discharge. Strict ED precautions discussed with patient. Patient states understanding and agrees to plan. Patient discharged home in no acute distress and stable vitals   Final Clinical Impressions(s) / ED Diagnoses   Final diagnoses:  RUQ pain    ED Discharge Orders    None       Romie Levee 11/13/19 0129    Daleen Bo, MD 11/13/19 1141

## 2019-11-12 NOTE — ED Triage Notes (Signed)
Pt here for eval of right upper quadrant abdominal pain radiating into the right shoulder blade. States he has had gallbladder ultrasound X 2 that were negative for stones. States the pain feels different this time and feels like it is fluttering.

## 2019-11-12 NOTE — Discharge Instructions (Addendum)
As discussed, your ultrasound today was negative for gallstones or any infection. It still showed the liver abnormalities that have been present before. I recommend calling to see if your HIDA scan could be pushed to earlier if your symptoms continue. You may take over the counter pain medication as needed, but be careful with taking too much given your history. I have included the number for [facility]. I would try and call them again to switch your medical records with them. Return to the ER for new or worsening symptoms.

## 2019-11-17 ENCOUNTER — Other Ambulatory Visit: Payer: Self-pay

## 2019-11-17 ENCOUNTER — Encounter (HOSPITAL_COMMUNITY): Payer: Self-pay

## 2019-11-17 ENCOUNTER — Encounter (HOSPITAL_COMMUNITY): Payer: Self-pay | Admitting: Emergency Medicine

## 2019-11-17 ENCOUNTER — Emergency Department (HOSPITAL_COMMUNITY): Payer: 59

## 2019-11-17 ENCOUNTER — Ambulatory Visit (HOSPITAL_COMMUNITY)
Admission: RE | Admit: 2019-11-17 | Discharge: 2019-11-17 | Disposition: A | Discharge status: Home / Self Care | Payer: 59 | Source: Ambulatory Visit | Attending: Primary Care | Admitting: Primary Care

## 2019-11-17 ENCOUNTER — Emergency Department (HOSPITAL_COMMUNITY)
Admission: EM | Admit: 2019-11-17 | Discharge: 2019-11-17 | Disposition: A | Discharge status: Home / Self Care | Payer: 59 | Attending: Emergency Medicine | Admitting: Emergency Medicine

## 2019-11-17 DIAGNOSIS — I129 Hypertensive chronic kidney disease with stage 1 through stage 4 chronic kidney disease, or unspecified chronic kidney disease: Secondary | ICD-10-CM | POA: Diagnosis not present

## 2019-11-17 DIAGNOSIS — R1084 Generalized abdominal pain: Secondary | ICD-10-CM

## 2019-11-17 DIAGNOSIS — Z79899 Other long term (current) drug therapy: Secondary | ICD-10-CM | POA: Diagnosis not present

## 2019-11-17 DIAGNOSIS — N189 Chronic kidney disease, unspecified: Secondary | ICD-10-CM | POA: Diagnosis not present

## 2019-11-17 DIAGNOSIS — R1011 Right upper quadrant pain: Secondary | ICD-10-CM | POA: Insufficient documentation

## 2019-11-17 DIAGNOSIS — R55 Syncope and collapse: Secondary | ICD-10-CM | POA: Insufficient documentation

## 2019-11-17 LAB — HEPATIC FUNCTION PANEL
ALT: 31 U/L (ref 0–44)
AST: 27 U/L (ref 15–41)
Albumin: 3.7 g/dL (ref 3.5–5.0)
Alkaline Phosphatase: 45 U/L (ref 38–126)
Bilirubin, Direct: <0.1 mg/dL (ref 0.0–0.2)
Total Bilirubin: 0.6 mg/dL (ref 0.3–1.2)
Total Protein: 6.6 g/dL (ref 6.5–8.1)

## 2019-11-17 LAB — BASIC METABOLIC PANEL
Anion gap: 9 (ref 5–15)
BUN: 17 mg/dL (ref 6–20)
CO2: 25 mmol/L (ref 22–32)
Calcium: 9.2 mg/dL (ref 8.9–10.3)
Chloride: 102 mmol/L (ref 98–111)
Creatinine, Ser: 1.15 mg/dL (ref 0.61–1.24)
GFR calc Af Amer: >60 mL/min (ref >60)
GFR calc non Af Amer: >60 mL/min (ref >60)
Glucose, Bld: 130 mg/dL — ABNORMAL HIGH (ref 70–99)
Potassium: 4.2 mmol/L (ref 3.5–5.1)
Sodium: 136 mmol/L (ref 135–145)

## 2019-11-17 LAB — CBC
HCT: 46.4 % (ref 39.0–52.0)
Hemoglobin: 16.3 g/dL (ref 13.0–17.0)
MCH: 32 pg (ref 26.0–34.0)
MCHC: 35.1 g/dL (ref 30.0–36.0)
MCV: 91 fL (ref 80.0–100.0)
Platelets: 271 10*3/uL (ref 150–400)
RBC: 5.1 MIL/uL (ref 4.22–5.81)
RDW: 13.1 % (ref 11.5–15.5)
WBC: 9.5 10*3/uL (ref 4.0–10.5)
nRBC: 0 % (ref 0.0–0.2)

## 2019-11-17 LAB — TROPONIN I (HIGH SENSITIVITY): Troponin I (High Sensitivity): 7 ng/L (ref <18)

## 2019-11-17 LAB — LIPASE, BLOOD: Lipase: 28 U/L (ref 11–51)

## 2019-11-17 MED ORDER — SODIUM CHLORIDE 0.9 % IV BOLUS
1000.0000 mL | Freq: Once | INTRAVENOUS | Status: AC
  Administered 2019-11-17: 1000 mL via INTRAVENOUS

## 2019-11-17 MED ORDER — SODIUM CHLORIDE 0.9% FLUSH
3.0000 mL | Freq: Once | INTRAVENOUS | Status: AC
  Administered 2019-11-17: 3 mL via INTRAVENOUS

## 2019-11-17 MED ORDER — ONDANSETRON HCL 4 MG/2ML IJ SOLN
4.0000 mg | Freq: Once | INTRAMUSCULAR | Status: AC
  Administered 2019-11-17: 4 mg via INTRAVENOUS
  Filled 2019-11-17: qty 2

## 2019-11-17 NOTE — ED Provider Notes (Signed)
[place] EMERGENCY DEPARTMENT Provider Note   CSN: [id number] Arrival date & time: 11/17/19  [id number]     History   Chief Complaint Chief Complaint  Patient presents with  . Loss of Consciousness    HPI Evan [name] is a 53 y.o. male.     The history is provided by the patient.  Loss of Consciousness Episode history:  Single Most recent episode:  Today Progression:  Resolved Chronicity:  New Context: blood draw (Patient was at radiology clinic getting a HIDA scan, shortly after having an IV started he got nauseous and lightheaded and passed out. Was sitting on a chair, did not hit his head. Feels back to normal. Has had chronic abdominal pain for weeks. )   Witnessed: yes   Relieved by:  Bed rest Worsened by:  Nothing Associated symptoms: no anxiety, no chest pain, no confusion, no diaphoresis, no dizziness, no fever, no headaches, no malaise/fatigue, no nausea, no palpitations, no seizures, no shortness of breath and no vomiting     Past Medical History:  Diagnosis Date  . Arthritis   . Chronic kidney disease   . Colon polyps    benign per pt  . Diverticulitis   . Hyperlipidemia   . Hypertension   . Lyme disease   . Seizures [facility])     Patient Active Problem List   Diagnosis Date Noted  . Preventative health care 11/02/2019  . Upper respiratory symptom 10/16/2019  . Vitamin D deficiency 09/04/2019  . B12 deficiency 09/04/2019  . Fatigue 09/04/2019  . Generalized abdominal pain 11/04/2018  . Myalgia 11/04/2018  . Unstable angina ([place])   . Chest pain, moderate coronary artery risk   . 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 Essential hypertension 04/14/2017  . Hyperlipidemia 04/14/2017  . Arthralgia 04/14/2017    Past Surgical History:  Procedure Laterality Date  . HERNIA REPAIR     x 2  . LEFT HEART CATH AND CORONARY ANGIOGRAPHY N/A 05/04/2018   Procedure: LEFT HEART CATH AND CORONARY ANGIOGRAPHY;  Surgeon: Leonie Man, MD;   Location: [place] [facility];  Service: Cardiovascular;  Laterality: N/A;  . NECK SURGERY    . NEPHRECTOMY Left 2010   for atrophy and reflux  . PROSTATE BIOPSY    . SHOULDER SURGERY Left         Home Medications    Prior to Admission medications   Medication Sig Start Date End Date Taking? Authorizing Provider  albuterol (VENTOLIN HFA) 108 (90 Base) MCG/ACT inhaler Inhale 1-2 puffs into the lungs every 4 (four) hours as needed for shortness of breath. 11/09/19   [provider]  aspirin EC 81 MG EC tablet Take 1 tablet (81 mg total) by mouth daily. Patient not taking: Reported on 11/12/2019 05/05/18   Edwin Dada, MD  betamethasone dipropionate (DIPROLENE) 0.05 % cream Apply 1 application topically daily.  02/16/18   [provider]  Cholecalciferol (VITAMIN D3) 5000 units CAPS Take 1 capsule by mouth as directed.     [provider]  diphenhydrAMINE (BENADRYL) 25 MG tablet Take 25 mg by mouth daily.    [provider]  lisinopril (ZESTRIL) 20 MG tablet Take 1 tablet (20 mg total) by mouth daily. For blood pressure. 11/02/19   [name], NP  omeprazole (PRILOSEC) 20 MG capsule Take 1 capsule (20 mg total) by mouth daily. 05/04/18   Danford, Suann Larry, MD  spironolactone (ALDACTONE) 25 MG tablet Take 1 tablet (25 mg  total) by mouth daily. Please call and schedule an appt for further 3rd attempt 10/23/19   Jerline Pain, MD    Family History Family History  Problem Relation Age of Onset  . Arthritis Mother   . Heart disease Mother   . Hypertension Mother   . Kidney disease Mother   . Colon cancer Father        dx in his early 57's  . Lung cancer Father   . Prostate cancer Father   . Breast cancer Father        85 or 32  . Lung cancer Brother        smoker  . Lung cancer Paternal Aunt   . Colon cancer Paternal Uncle   . Diabetes Paternal Uncle   . Arthritis Maternal Grandmother   . Colon cancer Maternal Grandmother    . Heart disease Maternal Grandmother   . Heart disease Maternal Grandfather   . Diabetes Paternal Grandfather   . Lung cancer Brother        smoker  . Bladder Cancer Brother     Social History Social History   Tobacco Use  . Smoking status: Never Smoker  . Smokeless tobacco: Never Used  Substance Use Topics  . Alcohol use: Yes    Comment: occasional  . Drug use: No     Allergies   Nuvigil [armodafinil], Seroquel [quetiapine], and Simvastatin   Review of Systems Review of Systems  Constitutional: Negative for chills, diaphoresis, fever and malaise/fatigue.  HENT: Negative for ear pain and sore throat.   Eyes: Negative for pain and visual disturbance.  Respiratory: Negative for cough and shortness of breath.   Cardiovascular: Positive for syncope. Negative for chest pain and palpitations.  Gastrointestinal: Positive for abdominal pain (on and off, slight cramping in lower abdominal area). Negative for nausea and vomiting.  Genitourinary: Negative for dysuria and hematuria.  Musculoskeletal: Negative for arthralgias and back pain.  Skin: Negative for color change and rash.  Neurological: Positive for light-headedness (resolved). Negative for dizziness, seizures, syncope, facial asymmetry and headaches.  Psychiatric/Behavioral: Negative for confusion.  All other systems reviewed and are negative.    Physical Exam Updated Vital Signs  ED Triage Vitals  Enc Vitals Group     BP 11/17/19 0741 124/83     Pulse Rate 11/17/19 0741 87     Resp 11/17/19 0741 18     Temp 11/17/19 0741 98.1 F (36.7 C)     Temp Source 11/17/19 0741 Oral     SpO2 11/17/19 0741 96 %     Weight --      Height --      Head Circumference --      Peak Flow --      Pain Score 11/17/19 0739 6     Pain Loc --      Pain Edu? --      Excl. in [place]? --     Physical Exam Vitals signs and nursing note reviewed.  Constitutional:      Appearance: He is well-developed.  HENT:     Head:  Normocephalic and atraumatic.     Nose: Nose normal.     Mouth/Throat:     Mouth: Mucous membranes are moist.  Eyes:     Extraocular Movements: Extraocular movements intact.     Conjunctiva/sclera: Conjunctivae normal.     Pupils: Pupils are equal, round, and reactive to light.  Neck:     Musculoskeletal: Normal range of motion and neck  supple.  Cardiovascular:     Rate and Rhythm: Normal rate and regular rhythm.     Pulses: Normal pulses.     Heart sounds: Normal heart sounds. No murmur.  Pulmonary:     Effort: Pulmonary effort is normal. No respiratory distress.     Breath sounds: Normal breath sounds.  Abdominal:     General: There is no distension.     Palpations: Abdomen is soft.     Tenderness: There is no abdominal tenderness.  Skin:    General: Skin is warm and dry.     Capillary Refill: Capillary refill takes less than 2 seconds.  Neurological:     General: No focal deficit present.     Mental Status: He is alert and oriented to person, place, and time.     Cranial Nerves: No cranial nerve deficit.     Sensory: No sensory deficit.     Motor: No weakness.     Comments: Normal speech, normal strength and sensation  Psychiatric:        Mood and Affect: Mood normal.      ED Treatments / Results  Labs (all labs ordered are listed, but only abnormal results are displayed) Labs Reviewed  BASIC METABOLIC PANEL - Abnormal; Notable for the following components:      Result Value   Glucose, Bld 130 (*)    All other components within normal limits  CBC  HEPATIC FUNCTION PANEL  LIPASE, BLOOD  TROPONIN I (HIGH SENSITIVITY)    EKG EKG Interpretation  Date/Time:  Friday November 17 2019 07:36:09 EST Ventricular Rate:  87 PR Interval:  172 QRS Duration: 86 QT Interval:  366 QTC Calculation: 440 R Axis:   -66 Text Interpretation: Normal sinus rhythm Left axis deviation Septal infarct , age undetermined Abnormal ECG Confirmed by Lennice Sites [id number]) on 11/17/2019  7:48:59 AM   Radiology Dg Chest 2 View  Result Date: 11/17/2019 CLINICAL DATA:  Syncopal episode. EXAM: CHEST - 2 VIEW COMPARISON:  11/12/2019 FINDINGS: The heart size and mediastinal contours are within normal limits. Both lungs are clear. The visualized skeletal structures are unremarkable. IMPRESSION: No active cardiopulmonary disease. Electronically Signed   By: Marlaine Hind M.D.   On: 11/17/2019 08:18    Procedures Procedures (including critical care time)  Medications Ordered in ED Medications  ondansetron (ZOFRAN) injection 4 mg (has no administration in time range)  sodium chloride flush (NS) 0.9 % injection 3 mL (3 mLs Intravenous Given 11/17/19 0829)  sodium chloride 0.9 % bolus 1,000 mL (0 mLs Intravenous Stopped 11/17/19 1005)     Initial Impression / Assessment and Plan / ED Course  I have reviewed the triage vital signs and the nursing notes.  Pertinent labs & imaging results that were available during my care of the patient were reviewed by me and considered in my medical decision making (see chart for details).     Evan [name] is a 53 year old male with history of high cholesterol, hypertension who presents to the ED with syncopal event.  Patient with normal vitals.  No fever.  Patient was at radiology clinic today getting a HIDA scan and shortly after an IV was started he felt lightheaded, nauseous and briefly passed out.  He did not hit his head.  This was witnessed by staff.  Likely situational syncope.  Does not have any chest pain or shortness of breath.  Has some abdominal pain but it appears that that is likely chronic.  He has had multiple  ultrasounds of his gallbladder and CT scan of his abdomen and pelvis that have been unremarkable.  He was obtaining a HIDA scan for further evaluation that was ordered by his primary care doctor.  Patient overall appears well.  Neurologically he is intact.  Given history and physical likely situational syncope.  However will  check basic labs, give IV fluid bolus.  EKG shows sinus rhythm.  No ischemic changes.  Patient did have clean heart cath last year.  Patient with no significant anemia, electrolyte abnormality, kidney injury.  Gallbladder and liver enzymes within normal limits.  Troponin normal.  Chest x-ray no signs of infection.  Felt better after IV fluids and Zofran.  Likely ongoing chronic abdominal issues.  Will have to have HIDA scan rescheduled likely.  Overall does not appear to have any acute intra-abdominal process given history and physical and lab work.  This chart was dictated using voice recognition software.  Despite best efforts to proofread,  errors can occur which can change the documentation meaning.    Final Clinical Impressions(s) / ED Diagnoses   Final diagnoses:  Syncope, unspecified syncope type    ED Discharge Orders    None       Lennice Sites, DO 11/17/19 1012

## 2019-11-17 NOTE — Telephone Encounter (Signed)
Attempted to call the pt re: his med refill for his Spironolactone and need for follow up.. no answer on his cell... pt is in ED after HIDA scan and according to notes he had a syncopal episode.

## 2019-11-17 NOTE — Discharge Instructions (Addendum)
Please talk with your primary care doctor about rescheduling your HIDA scan.

## 2019-11-17 NOTE — Significant Event (Signed)
Rapid Response Event Note  I responded to the Rapid Response Team call to Nuclear Medicine. Upon arrival, patient was quite clammy, diaphoretic, and pale. Per staff, patient passed out and perhaps had a syncopal episode. [name] was awake and he endorse sharp RT sided abdominal pain, he denied CP/SOB and his abdominal pain quickly subsided but he was still quite diaphoretic and endorse being dizzy when his eyes were closed. With his permission, I took to the ED and he was triaged.   Start Time 0720  End Time 0745  Evan [name] R

## 2019-11-17 NOTE — Progress Notes (Signed)
Called over to Nuclear Medicine to help with pt. Pt sitting up in chair awake but not really responsive. Would not answer any questions. Pale, diaphoretic and clammy. Pt was gagging but did not throw up. Rapid response called and pt placed on stretcher. Pt becoming more responsive once on stretcher and rapid response present. Started complaining of right sided abd pain. Pt was taken to ED by rapid response team.

## 2019-11-17 NOTE — ED Triage Notes (Signed)
Pt here from [place] where he was attempting to do a Hida scan out pt and he had a syncopal episode , pt arrived to the ED on stretcher awake and alert , no chest pain no sob

## 2019-11-23 ENCOUNTER — Other Ambulatory Visit: Payer: Self-pay

## 2019-11-23 ENCOUNTER — Encounter: Payer: Self-pay | Admitting: Cardiology

## 2019-11-23 ENCOUNTER — Ambulatory Visit: Payer: 59 | Admitting: Cardiology

## 2019-11-23 VITALS — BP 116/80 | HR 90 | Ht 70.0 in | Wt 292.0 lb

## 2019-11-23 DIAGNOSIS — I10 Essential (primary) hypertension: Secondary | ICD-10-CM

## 2019-11-23 DIAGNOSIS — R079 Chest pain, unspecified: Secondary | ICD-10-CM | POA: Diagnosis not present

## 2019-11-23 DIAGNOSIS — M79606 Pain in leg, unspecified: Secondary | ICD-10-CM | POA: Diagnosis not present

## 2019-11-23 MED ORDER — SPIRONOLACTONE 25 MG PO TABS: 25.0000 mg | ORAL_TABLET | Freq: Every day | ORAL | 3 refills | Status: DC

## 2019-11-23 NOTE — Addendum Note (Signed)
Addended by: Shellia Cleverly on: 11/23/2019 03:21 PM   Modules accepted: Orders

## 2019-11-23 NOTE — Progress Notes (Signed)
Cardiology Office Note:    Date:  11/23/2019   ID:  Evan [name], DOB 1966-09-17, MRN [id number]  PCP:  [name], NP  Cardiologist:  Candee Furbish, MD   Referring MD: [name], NP     History of Present Illness:    Evan [name] is a 53 y.o. male with history of nephrectomy, recent hospitalization with accelerating chest pain who underwent cardiac catheterization that showed no significant coronary atherosclerosis, normal left ventricular end-diastolic pressure here for the evaluation of hypertension.   Placed on 81 mg of aspirin and 25 mg of metoprolol twice daily, which she has not started.  Lisinopril when increased HA, dizziness. Leg aches. Hurt. Dizzy. Saw Nepho in [place].   All of a sudden has extreme charlie horse in one side of abd, knot, passed out from pain.   Other complaints for dysphasia when laying back.  Also had hypertension and Candida esophagitis completed Diflucan, GI follow-up.  He has had problems with cramping in the past, abdominal pain, has had work-ups in the past, demonstrating adhesions, has had hernia repair, nephrectomy.  Was worried at one point about leg swelling.  Fatigue.  11/23/19 - HIDA scan fainted. To ER.  Still having some abdominal issues, right upper quadrant pain at times.  Has abdominal cramping, sound like muscular cramping.  May try magnesium.  Potassium has been good.  Had some leg weakness at 1 point.  Some burning when walking.  We will check lower extremity ABIs.  Past Medical History:  Diagnosis Date  . Arthritis   . Chronic kidney disease   . Colon polyps    benign per pt  . Diverticulitis   . Hyperlipidemia   . Hypertension   . Lyme disease   . Seizures ([place])     Past Surgical History:  Procedure Laterality Date  . HERNIA REPAIR     x 2  . LEFT HEART CATH AND CORONARY ANGIOGRAPHY N/A 05/04/2018   Procedure: LEFT HEART CATH AND CORONARY ANGIOGRAPHY;  Surgeon: Leonie Man, MD;  Location: [place] [facility];  Service: Cardiovascular;  Laterality: N/A;  . NECK SURGERY    . NEPHRECTOMY Left 2010   for atrophy and reflux  . PROSTATE BIOPSY    . SHOULDER SURGERY Left     Current Medications: Current Meds  Medication Sig  . albuterol (VENTOLIN HFA) 108 (90 Base) MCG/ACT inhaler Inhale 1-2 puffs into the lungs every 4 (four) hours as needed for shortness of breath.  . betamethasone dipropionate (DIPROLENE) 0.05 % cream Apply 1 application topically daily.   . Cholecalciferol (VITAMIN D3) 5000 units CAPS Take 1 capsule by mouth as directed.   . diphenhydrAMINE (BENADRYL) 25 MG tablet Take 25 mg by mouth daily.  Marland Kitchen lisinopril (ZESTRIL) 20 MG tablet Take 1 tablet (20 mg total) by mouth daily. For blood pressure.  Marland Kitchen omeprazole (PRILOSEC) 20 MG capsule Take 1 capsule (20 mg total) by mouth daily.  Marland Kitchen spironolactone (ALDACTONE) 25 MG tablet Take 1 tablet (25 mg total) by mouth daily. Please call and schedule an appt for further 3rd attempt     Allergies:   Nuvigil [armodafinil], Seroquel [quetiapine], and Simvastatin   Social History   Socioeconomic History  . Marital status: Married    Spouse name: Not on file  . Number of children: 4  . Years of education: Not on file  . Highest education level: Not on file  Occupational History  . Occupation: Freight forwarder  Tobacco Use  .  Smoking status: Never Smoker  . Smokeless tobacco: Never Used  Substance and Sexual Activity  . Alcohol use: Yes    Comment: occasional  . Drug use: No  . Sexual activity: Not on file  Other Topics Concern  . Not on file  Social History Narrative  . Not on file   Social Determinants of Health   Financial Resource Strain:   . Difficulty of Paying Living Expenses: Not on file  Food Insecurity:   . Worried About Charity fundraiser in the Last Year: Not on file  . Ran Out of Food in the Last Year: Not on file  Transportation Needs:   . Lack of Transportation (Medical): Not on file  . Lack of Transportation  (Non-Medical): Not on file  Physical Activity:   . Days of Exercise per Week: Not on file  . Minutes of Exercise per Session: Not on file  Stress:   . Feeling of Stress : Not on file  Social Connections:   . Frequency of Communication with Friends and Family: Not on file  . Frequency of Social Gatherings with Friends and Family: Not on file  . Attends Religious Services: Not on file  . Active Member of Clubs or Organizations: Not on file  . Attends Archivist Meetings: Not on file  . Marital Status: Not on file     Family History: The patient's family history includes Arthritis in his maternal grandmother and mother; Bladder Cancer in his brother; Breast cancer in his father; Colon cancer in his father, maternal grandmother, and paternal uncle; Diabetes in his paternal grandfather and paternal uncle; Heart disease in his maternal grandfather, maternal grandmother, and mother; Hypertension in his mother; Kidney disease in his mother; Lung cancer in his brother, brother, father, and paternal aunt; Prostate cancer in his father.  ROS:   Please see the history of present illness.     All other systems reviewed and are negative.  EKGs/Labs/Other Studies Reviewed:    The following studies were reviewed today:  05/04/18 ECHO: - Left ventricle: The cavity size was normal. There was mild   concentric hypertrophy. Systolic function was normal. The   estimated ejection fraction was in the range of 60% to 65%. Wall   motion was normal; there were no regional wall motion   abnormalities. Doppler parameters are consistent with abnormal   left ventricular relaxation (grade 1 diastolic dysfunction).   There was no evidence of elevated ventricular filling pressure by   Doppler parameters. - Aortic valve: There was no regurgitation. - Right ventricle: Systolic function was normal. - Right atrium: The atrium was normal in size. - Tricuspid valve: There was trivial regurgitation. -  Pulmonary arteries: Systolic pressure was within the normal   range. - Inferior vena cava: The vessel was normal in size. - Pericardium, extracardiac: There was no pericardial effusion.  EKG: Sinus rhythm.  Recent Labs: 09/04/2019: TSH 2.09 11/17/2019: ALT 31; BUN 17; Creatinine, Ser 1.15; Hemoglobin 16.3; Platelets 271; Potassium 4.2; Sodium 136  Recent Lipid Panel    Component Value Date/Time   CHOL 269 (H) 11/02/2019 0815   TRIG 166.0 (H) 11/02/2019 0815   HDL 35.30 (L) 11/02/2019 0815   CHOLHDL 8 11/02/2019 0815   VLDL 33.2 11/02/2019 0815   LDLCALC 201 (H) 11/02/2019 0815   LDLDIRECT 173.0 04/14/2017 1105    Physical Exam:    VS:  BP 116/80   Pulse 90   Ht 5\' 10"  (1.778 m)   Wt 292  lb (132.5 kg)   SpO2 96%   BMI 41.90 kg/m     Wt Readings from Last 3 Encounters:  11/23/19 292 lb (132.5 kg)  11/02/19 292 lb 12 oz (132.8 kg)  10/13/19 289 lb (131.1 kg)    GEN: Well nourished, well developed, in no acute distress  HEENT: normal  Neck: no JVD, carotid bruits, or masses Cardiac: RRR; no murmurs, rubs, or gallops,no edema  Respiratory:  clear to auscultation bilaterally, normal work of breathing GI: soft, nontender, nondistended, + BS MS: no deformity or atrophy  Skin: warm and dry, no rash Neuro:  Alert and Oriented x 3, Strength and sensation are intact Psych: euthymic mood, full affect   ASSESSMENT:    1. Chest pain, moderate coronary artery risk   2. Pain of lower extremity, unspecified laterality   3. Morbid obesity ([place])   4. Essential hypertension    PLAN:    In order of problems listed above:  Abdominal discomfort/shortness of breath -Reassuring cardiac catheterization on 05/04/2018-with no significant atherosclerosis.  No further work-up.  Continue with aggressive primary prevention.  Weight loss, exercise, blood pressure control.  Essential hypertension-previously difficult to control -  Sounds like he has had issues with refractory hypertension  in the past.   -Excellent job with his blood pressure control currently.  The addition of spironolactone 25 mg seem to have helped out significantly.  Has history of nephrectomy.  Creatinine has been stable.  Medications reviewed.  Continue to follow closely with his primary physician, Dr. Carlis Abbott as well.    Mixed hyperlipidemia/hypertriglyceridemia - Weight loss.  Could consider fish oil for triglyceride reduction.  No coronary artery disease detected on angiogram, reassurance.  I would like to work on aggressive dietary modifications, weight loss.  Eventually one could consider statin therapy however he does have a baseline elevated CK around 493.  Question some underlying myopathy  Morbid obesity - Continue to encourage weight loss.  This will also help with his blood pressure.  Realistically needs a 70-80 pound weight loss.  Continue work on decrease carbohydrates.  Continue to work on this.  Abdominal pain -Unfortunately did not finish HIDA scan because of syncope, vasovagal syncope.  He is seeing general surgery.  Leg weakness -We will check lower extremity ABIs  Medication Adjustments/Labs and Tests Ordered: Current medicines are reviewed at length with the patient today.  Concerns regarding medicines are outlined above.  Orders Placed This Encounter  Procedures  . VAS [place] LOWER EXTREMITY ARTERIAL DUPLEX   No orders of the defined types were placed in this encounter.   Patient Instructions  Medication Instructions:  The current medical regimen is effective;  continue present plan and medications.  *If you need a refill on your cardiac medications before your next appointment, please call your pharmacy*  Testing/Procedures: Your physician has requested that you have a lower extremity arterial exercise duplex. During this test, exercise and ultrasound are used to evaluate arterial blood flow in the legs. Allow one hour for this exam. There are no restrictions or special  instructions.  Follow-Up: At [facility], you and your health needs are our priority.  As part of our continuing mission to provide you with exceptional heart care, we have created designated Provider Care Teams.  These Care Teams include your primary Cardiologist (physician) and Advanced Practice Providers (APPs -  Physician Assistants and Nurse Practitioners) who all work together to provide you with the care you need, when you need it.  Your next appointment:  6 month(s)  The format for your next appointment:   In Person  Provider:   Candee Furbish, MD  Thank you for choosing [facility]!!        Signed, Candee Furbish, MD  11/23/2019 3:19 PM    [place]

## 2019-11-23 NOTE — Patient Instructions (Signed)
Medication Instructions:  The current medical regimen is effective;  continue present plan and medications.  *If you need a refill on your cardiac medications before your next appointment, please call your pharmacy*  Testing/Procedures: Your physician has requested that you have a lower extremity arterial exercise duplex. During this test, exercise and ultrasound are used to evaluate arterial blood flow in the legs. Allow one hour for this exam. There are no restrictions or special instructions.  Follow-Up: At [facility], you and your health needs are our priority.  As part of our continuing mission to provide you with exceptional heart care, we have created designated Provider Care Teams.  These Care Teams include your primary Cardiologist (physician) and Advanced Practice Providers (APPs -  Physician Assistants and Nurse Practitioners) who all work together to provide you with the care you need, when you need it.  Your next appointment:   6 month(s)  The format for your next appointment:   In Person  Provider:   Candee Furbish, MD  Thank you for choosing [facility]!!

## 2019-11-27 ENCOUNTER — Other Ambulatory Visit (HOSPITAL_COMMUNITY): Payer: Self-pay | Admitting: Cardiology

## 2019-11-27 DIAGNOSIS — I739 Peripheral vascular disease, unspecified: Secondary | ICD-10-CM

## 2019-12-07 ENCOUNTER — Other Ambulatory Visit: Payer: Self-pay

## 2019-12-07 ENCOUNTER — Ambulatory Visit (HOSPITAL_COMMUNITY)
Admission: RE | Admit: 2019-12-07 | Discharge: 2019-12-07 | Disposition: A | Discharge status: Home / Self Care | Payer: No Typology Code available for payment source | Source: Ambulatory Visit | Attending: Cardiovascular Disease | Admitting: Cardiovascular Disease

## 2019-12-07 DIAGNOSIS — M79606 Pain in leg, unspecified: Secondary | ICD-10-CM | POA: Insufficient documentation

## 2019-12-07 DIAGNOSIS — I739 Peripheral vascular disease, unspecified: Secondary | ICD-10-CM | POA: Diagnosis present

## 2019-12-15 HISTORY — PX: CHOLECYSTECTOMY: SHX55

## 2019-12-28 ENCOUNTER — Ambulatory Visit: Payer: Self-pay | Admitting: General Surgery

## 2019-12-28 NOTE — H&P (Signed)
History of Present Illness Ralene Ok MD; 12/28/2019 9:36 AM) The patient is a 54 year old male who presents for evaluation of gall stones. Referred by: Dr. Loma Boston Chief Complaint: Abdominal pain  Patient is a 54 year old male who comes in with a history of hypertension, abdominal pain that is been expensive for 1 year. He states that pain usually starts after eating and sometimes with just drinking liquids. He states the patient is a right upper quadrant area very severe. He states that sometimes this radiates to the back area. Patient states that sometimes associated with eating fatty meals. He has been trying to avoid high fat meals. Patient underwent ultrasound revealed no gallstones. I did review this personally. Patient also underwent HIDA scan. HIDA SCAN PATIENT ENDED UP HAVING A SYNCOPAL EPISODE. HE WAS TAKEN TO THE ER AND EVALUATED BY CARDIOLOGY AND WAS FOUND TO LIKELY HAVE A VASOVAGAL EPISODE.  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 HE'S ALSO HAD 2 UMBILICAL HERNIA REPAIRS AND LEFT PARAMEDIAN INCISIONal HERNIA REPAIR.     Past Surgical History (Tanisha A. Owens Shark, [place]; 12/28/2019 9:15 AM) Shoulder Surgery  Left. Spinal Surgery - Neck   Diagnostic Studies History (Tanisha A. Owens Shark, [place]; 12/28/2019 9:15 AM) Colonoscopy  5-10 years ago  Allergies (Tanisha A. Owens Shark, [place]; 12/28/2019 9:16 AM) SEROquel *ANTIPSYCHOTICS/ANTIMANIC AGENTS*  Statins  Nuvigil *ADHD/ANTI-NARCOLEPSY/ANTI-OBESITY/ANOREXIANTS*  Allergies Reconciled   Medication History (Tanisha A. Owens Shark, [place]; 12/28/2019 9:17 AM) Lisinopril (20MG  Tablet, Oral) Active. Spironolactone (25MG  Tablet, Oral) Active. Omeprazole (20MG  Capsule DR, Oral) Active. Vitamin D3 (Oral) Specific strength unknown - Active. Medications Reconciled  Social History (Tanisha A. Owens Shark, [place]; 12/28/2019 9:15 AM) No drug use  Tobacco use  Never smoker.  Family History (Tanisha  A. Owens Shark, [place]; 12/28/2019 9:15 AM) Cerebrovascular Accident  Mother. Kidney Disease  Mother. Migraine Headache  Daughter.  Other Problems (Tanisha A. Owens Shark, [place]; 12/28/2019 9:15 AM) Gastroesophageal Reflux Disease  Other disease, cancer, significant illness     Review of Systems Ralene Ok MD; 12/28/2019 9:34 AM) General Present- Feeling well. Not Present- Fever. Respiratory Not Present- Cough and Difficulty Breathing. Cardiovascular Not Present- Chest Pain. Gastrointestinal Present- Abdominal Pain and Nausea. Musculoskeletal Present- Joint Stiffness. Not Present- Back Pain, Joint Pain, Muscle Pain, Muscle Weakness and Swelling of Extremities. Neurological Not Present- Weakness. Psychiatric Not Present- Anxiety, Bipolar, Change in Sleep Pattern, Depression, Fearful and Frequent crying. Hematology Not Present- Blood Thinners, Easy Bruising, Excessive bleeding, Gland problems, HIV and Persistent Infections. All other systems negative   Physical Exam Ralene Ok MD; 12/28/2019 9:37 AM) The physical exam findings are as follows: Note:Constitutional: No acute distress, conversant, appears stated age  Eyes: Anicteric sclerae, moist conjunctiva, no lid lag  Neck: No thyromegaly, trachea midline, no cervical lymphadenopathy  Lungs: Clear to auscultation biilaterally, normal respiratory effot  Cardiovascular: regular rate & rhythm, no murmurs, no peripheal edema, pedal pulses 2+  GI: Soft, no masses or hepatosplenomegaly, non-tender to palpation, left paramedian incision scar, super umbilical midline scar  MSK: Normal gait, no clubbing cyanosis, edema  Skin: No rashes, palpation reveals normal skin turgor  Psychiatric: Appropriate judgment and insight, oriented to person, place, and time    Assessment & Plan Ralene Ok MD; 12/28/2019 9:38 AM) BILIARY DYSKINESIA (K82.8) Impression: Patient is a 54 year old male with likely biliary dyskinesia, history of  hypertension. 1. We will proceed to the operating room for a laparoscopic cholecystectomy  2. Risks and benefits were discussed with the patient to generally include, but not  limited to: infection, bleeding, possible need for post op ERCP, damage to the bile ducts, bile leak, and possible need for further surgery. Alternatives were offered and described. All questions were answered and the patient voiced understanding of the procedure and wishes to proceed at this point with a laparoscopic cholecystectomy

## 2020-01-05 ENCOUNTER — Ambulatory Visit (INDEPENDENT_AMBULATORY_CARE_PROVIDER_SITE_OTHER): Payer: No Typology Code available for payment source | Admitting: Primary Care

## 2020-01-05 ENCOUNTER — Encounter: Payer: Self-pay | Admitting: Primary Care

## 2020-01-05 ENCOUNTER — Other Ambulatory Visit: Payer: Self-pay

## 2020-01-05 VITALS — BP 120/82 | HR 84 | Temp 97.3°F | Ht 70.0 in | Wt 295.5 lb

## 2020-01-05 DIAGNOSIS — R05 Cough: Secondary | ICD-10-CM

## 2020-01-05 DIAGNOSIS — I10 Essential (primary) hypertension: Secondary | ICD-10-CM

## 2020-01-05 DIAGNOSIS — R053 Chronic cough: Secondary | ICD-10-CM

## 2020-01-05 HISTORY — DX: Chronic cough: R05.3

## 2020-01-05 MED ORDER — LOSARTAN POTASSIUM 50 MG PO TABS: 50.0000 mg | ORAL_TABLET | Freq: Every day | ORAL | 0 refills | Status: DC

## 2020-01-05 NOTE — Assessment & Plan Note (Signed)
Likely ACE induced cough. Given history of diabetes and nephrectomy we will switch him to losartan for renal protection.  We will see him back in the office in one week for BP check and BMP.  Continue spironolactone. Will monitor potassium.

## 2020-01-05 NOTE — Assessment & Plan Note (Signed)
Likely ACE induced, is on PPI for GERD, also seems to have PND that could be contributing.  Stop ACE, switch to losartan for renal protection against diabetes.  Add Zyrtec HS for post nasal drip.  Follow up in 1 week.

## 2020-01-05 NOTE — Patient Instructions (Addendum)
Stop lisinopril.  Start losartan for blood pressure.  Start cetirizine (Zyrtec) every evening at bedtime for the phelm.   Please schedule a follow up appointment for one week.  It was a pleasure to see you today!

## 2020-01-05 NOTE — Progress Notes (Signed)
Subjective:    Patient ID: Evan [name], male    DOB: 1966-09-18, 54 y.o.   MRN: [id number]  HPI  This visit occurred during the SARS-CoV-2 public health emergency.  Safety protocols were in place, including screening questions prior to the visit, additional usage of staff PPE, and extensive cleaning of exam room while observing appropriate contact time as indicated for disinfecting solutions.   Evan [name] is a very pleasant 54 year old male with a history of hypertension, unstable angina, hyperlipidemia, nephrectomy, fatigue, chronic abdominal pain who presents today for follow up.  Since his last visit he's been evaluated by general surgery, diagnosed with biliary dyskinesia, plan is laparoscopic cholecystectomy. He is scheduled for this procedure on 01/16/20.  Today he's concerned about his breathing and a dry cough that have been chronic for months. When breathing in he has a dry cough that is daily. Also after his morning shower will notice "heaviness when breathing out", feel like he cannot get his air out at times. Over the last several weeks when waking he'll have to cough up sputum, some darker/yellow "chunks", clear phlegm throughout the day. During the middle of the night last night he woke up and stared coughing, coughed up some yellow, stringy mucous for which he brings today.   He's never smoked, has a strong family history of lung cancer in numerous relatives that have smoked. He is compliant to his omeprazole daily. He is managed on lisinopril.   BP Readings from Last 3 Encounters:  01/05/20 120/82  11/23/19 116/80  11/17/19 127/83      Review of Systems  Constitutional: Negative for chills and fever.  HENT: Positive for congestion and postnasal drip.   Respiratory: Positive for cough and shortness of breath.        Past Medical History:  Diagnosis Date  . Arthritis   . Chronic kidney disease   . Colon polyps    benign per pt  . Diverticulitis   .  Hyperlipidemia   . Hypertension   . Lyme disease   . Seizures ([place])      Social History   Socioeconomic History  . Marital status: Married    Spouse name: Not on file  . Number of children: 4  . Years of education: Not on file  . Highest education level: Not on file  Occupational History  . Occupation: Freight forwarder  Tobacco Use  . Smoking status: Never Smoker  . Smokeless tobacco: Never Used  Substance and Sexual Activity  . Alcohol use: Yes    Comment: occasional  . Drug use: No  . Sexual activity: Not on file  Other Topics Concern  . Not on file  Social History Narrative  . Not on file   Social Determinants of Health   Financial Resource Strain:   . Difficulty of Paying Living Expenses: Not on file  Food Insecurity:   . Worried About Charity fundraiser in the Last Year: Not on file  . Ran Out of Food in the Last Year: Not on file  Transportation Needs:   . Lack of Transportation (Medical): Not on file  . Lack of Transportation (Non-Medical): Not on file  Physical Activity:   . Days of Exercise per Week: Not on file  . Minutes of Exercise per Session: Not on file  Stress:   . Feeling of Stress : Not on file  Social Connections:   . Frequency of Communication with Friends and Family: Not on file  . Frequency  of Social Gatherings with Friends and Family: Not on file  . Attends Religious Services: Not on file  . Active Member of Clubs or Organizations: Not on file  . Attends Archivist Meetings: Not on file  . Marital Status: Not on file  Intimate Partner Violence:   . Fear of Current or Ex-Partner: Not on file  . Emotionally Abused: Not on file  . Physically Abused: Not on file  . Sexually Abused: Not on file    Past Surgical History:  Procedure Laterality Date  . HERNIA REPAIR     x 2  . LEFT HEART CATH AND CORONARY ANGIOGRAPHY N/A 05/04/2018   Procedure: LEFT HEART CATH AND CORONARY ANGIOGRAPHY;  Surgeon: Leonie Man, MD;  Location: [facility];  Service: Cardiovascular;  Laterality: N/A;  . NECK SURGERY    . NEPHRECTOMY Left 2010   for atrophy and reflux  . PROSTATE BIOPSY    . SHOULDER SURGERY Left     Family History  Problem Relation Age of Onset  . Arthritis Mother   . Heart disease Mother   . Hypertension Mother   . Kidney disease Mother   . Colon cancer Father        dx in his early 49's  . Lung cancer Father   . Prostate cancer Father   . Breast cancer Father        64 or 77  . Lung cancer Brother        smoker  . Lung cancer Paternal Aunt   . Colon cancer Paternal Uncle   . Diabetes Paternal Uncle   . Arthritis Maternal Grandmother   . Colon cancer Maternal Grandmother   . Heart disease Maternal Grandmother   . Heart disease Maternal Grandfather   . Diabetes Paternal Grandfather   . Lung cancer Brother        smoker  . Bladder Cancer Brother     Allergies  Allergen Reactions  . Nuvigil [Armodafinil] Other (See Comments)    Seizures   . Seroquel [Quetiapine] Other (See Comments)    Seizures.  . Simvastatin Hives    Current Outpatient Medications on File Prior to Visit  Medication Sig Dispense Refill  . albuterol (VENTOLIN HFA) 108 (90 Base) MCG/ACT inhaler Inhale 1-2 puffs into the lungs every 4 (four) hours as needed for shortness of breath.    . betamethasone dipropionate (DIPROLENE) 0.05 % cream Apply 1 application topically daily.   3  . Cholecalciferol (VITAMIN D3) 5000 units CAPS Take 1 capsule by mouth as directed.     . diphenhydrAMINE (BENADRYL) 25 MG tablet Take 25 mg by mouth daily.    Marland Kitchen omeprazole (PRILOSEC) 20 MG capsule Take 1 capsule (20 mg total) by mouth daily. 60 capsule 3  . spironolactone (ALDACTONE) 25 MG tablet Take 1 tablet (25 mg total) by mouth daily. 90 tablet 3   No current facility-administered medications on file prior to visit.    BP 120/82   Pulse 84   Temp (!) 97.3 F (36.3 C) (Temporal)   Ht 5\' 10"  (1.778 m)   Wt 295 lb 8 oz (134 kg)    SpO2 94%   BMI 42.40 kg/m    Objective:   Physical Exam  Constitutional: He appears well-nourished. He does not appear ill.  Cardiovascular: Normal rate and regular rhythm.  Respiratory: Effort normal and breath sounds normal. He has no wheezes.  Dry cough numerous times during exam  Assessment & Plan:

## 2020-01-12 ENCOUNTER — Encounter: Payer: Self-pay | Admitting: Primary Care

## 2020-01-12 ENCOUNTER — Other Ambulatory Visit: Payer: Self-pay

## 2020-01-12 ENCOUNTER — Ambulatory Visit (INDEPENDENT_AMBULATORY_CARE_PROVIDER_SITE_OTHER): Payer: No Typology Code available for payment source | Admitting: Primary Care

## 2020-01-12 VITALS — BP 122/82 | HR 85 | Temp 96.2°F | Ht 70.0 in | Wt 294.2 lb

## 2020-01-12 DIAGNOSIS — I10 Essential (primary) hypertension: Secondary | ICD-10-CM

## 2020-01-12 DIAGNOSIS — J302 Other seasonal allergic rhinitis: Secondary | ICD-10-CM | POA: Diagnosis not present

## 2020-01-12 DIAGNOSIS — R05 Cough: Secondary | ICD-10-CM | POA: Diagnosis not present

## 2020-01-12 DIAGNOSIS — R053 Chronic cough: Secondary | ICD-10-CM

## 2020-01-12 LAB — COMPREHENSIVE METABOLIC PANEL
ALT: 30 U/L (ref 0–53)
AST: 25 U/L (ref 0–37)
Albumin: 4.1 g/dL (ref 3.5–5.2)
Alkaline Phosphatase: 48 U/L (ref 39–117)
BUN: 14 mg/dL (ref 6–23)
CO2: 32 mEq/L (ref 19–32)
Calcium: 9.5 mg/dL (ref 8.4–10.5)
Chloride: 101 mEq/L (ref 96–112)
Creatinine, Ser: 1.12 mg/dL (ref 0.40–1.50)
GFR: 67.94 mL/min (ref >60.00)
Glucose, Bld: 122 mg/dL — ABNORMAL HIGH (ref 70–99)
Potassium: 4.5 mEq/L (ref 3.5–5.1)
Sodium: 138 mEq/L (ref 135–145)
Total Bilirubin: 0.5 mg/dL (ref 0.2–1.2)
Total Protein: 6.8 g/dL (ref 6.0–8.3)

## 2020-01-12 MED ORDER — FLUTICASONE PROPIONATE 50 MCG/ACT NA SUSP: 1.0000 | Freq: Two times a day (BID) | NASAL | 0 refills | Status: DC

## 2020-01-12 MED ORDER — LOSARTAN POTASSIUM 50 MG PO TABS: 50.0000 mg | ORAL_TABLET | Freq: Every day | ORAL | 3 refills | Status: DC

## 2020-01-12 NOTE — Assessment & Plan Note (Signed)
Chronic PND with nasal congestion. Continue Allegra, add Flonase. He will update.

## 2020-01-12 NOTE — Assessment & Plan Note (Signed)
Improving with ARB rather than ACE. CMP pending. Continue losartan.  Discussed to continue Allegra for another 2 weeks, add in Flonase. If no improvement then switch to Zyrtec. He will update.

## 2020-01-12 NOTE — Patient Instructions (Addendum)
Stop by the lab prior to leaving today. I will notify you of your results once received.   Continue taking losartan daily for blood pressure.  Continue with Allegra once daily for allergies for at least 2 additional weeks. Switch to Zyrtec if no improvement.  Nasal Congestion/Ear Pressure/Sinus Pressure: Try using Flonase (fluticasone) nasal spray. Instill 1 spray in each nostril twice daily.   It was a pleasure to see you today!

## 2020-01-12 NOTE — Progress Notes (Signed)
Subjective:    Patient ID: Evan [name], male    DOB: 1966/02/05, 54 y.o.   MRN: [id number]  HPI  This visit occurred during the SARS-CoV-2 public health emergency.  Safety protocols were in place, including screening questions prior to the visit, additional usage of staff PPE, and extensive cleaning of exam room while observing appropriate contact time as indicated for disinfecting solutions.   Evan [name] is a 54 year old male with a history of hypertension, unstable angina, nephrectomy, generalized abdominal pain, hyperlipidemia who presents today for follow up of hypertension.  He was last evaluated on week ago for symptoms of cough and shortness of breath. He will be going for cholecystectomy in early February and wanted to ensure no active problems that would delay surgery. His cough seemed to be secondary to lisinopril so we discontinued and switched to losartan. We also added Zyrtec HS for post nasal drip.  Since his last visit he's noticed slight improvement in his cough. He really noticed this last night especially when eating dinner. He's tried taking Allegra daily for his post nasal drip without improvement, started this one week ago. He does have chronic nasal congestion. He's not using nasal sprays.   BP Readings from Last 3 Encounters:  01/12/20 122/82  01/05/20 120/82  11/23/19 116/80      Review of Systems  HENT: Positive for congestion and postnasal drip.   Respiratory: Positive for cough. Negative for shortness of breath.   Cardiovascular: Negative for chest pain.  Allergic/Immunologic: Positive for environmental allergies.       Past Medical History:  Diagnosis Date  . Arthritis   . Chronic kidney disease   . Colon polyps    benign per pt  . Diverticulitis   . Hyperlipidemia   . Hypertension   . Lyme disease   . Seizures ([place])      Social History   Socioeconomic History  . Marital status: Married    Spouse name: Not on file  . Number of children:  4  . Years of education: Not on file  . Highest education level: Not on file  Occupational History  . Occupation: Freight forwarder  Tobacco Use  . Smoking status: Never Smoker  . Smokeless tobacco: Never Used  Substance and Sexual Activity  . Alcohol use: Yes    Comment: occasional  . Drug use: No  . Sexual activity: Not on file  Other Topics Concern  . Not on file  Social History Narrative  . Not on file   Social Determinants of Health   Financial Resource Strain:   . Difficulty of Paying Living Expenses: Not on file  Food Insecurity:   . Worried About Charity fundraiser in the Last Year: Not on file  . Ran Out of Food in the Last Year: Not on file  Transportation Needs:   . Lack of Transportation (Medical): Not on file  . Lack of Transportation (Non-Medical): Not on file  Physical Activity:   . Days of Exercise per Week: Not on file  . Minutes of Exercise per Session: Not on file  Stress:   . Feeling of Stress : Not on file  Social Connections:   . Frequency of Communication with Friends and Family: Not on file  . Frequency of Social Gatherings with Friends and Family: Not on file  . Attends Religious Services: Not on file  . Active Member of Clubs or Organizations: Not on file  . Attends Archivist Meetings: Not on  file  . Marital Status: Not on file  Intimate Partner Violence:   . Fear of Current or Ex-Partner: Not on file  . Emotionally Abused: Not on file  . Physically Abused: Not on file  . Sexually Abused: Not on file    Past Surgical History:  Procedure Laterality Date  . HERNIA REPAIR     x 2  . LEFT HEART CATH AND CORONARY ANGIOGRAPHY N/A 05/04/2018   Procedure: LEFT HEART CATH AND CORONARY ANGIOGRAPHY;  Surgeon: Leonie Man, MD;  Location: [facility];  Service: Cardiovascular;  Laterality: N/A;  . NECK SURGERY    . NEPHRECTOMY Left 2010   for atrophy and reflux  . PROSTATE BIOPSY    . SHOULDER SURGERY Left     Family History    Problem Relation Age of Onset  . Arthritis Mother   . Heart disease Mother   . Hypertension Mother   . Kidney disease Mother   . Colon cancer Father        dx in his early 52's  . Lung cancer Father   . Prostate cancer Father   . Breast cancer Father        28 or 32  . Lung cancer Brother        smoker  . Lung cancer Paternal Aunt   . Colon cancer Paternal Uncle   . Diabetes Paternal Uncle   . Arthritis Maternal Grandmother   . Colon cancer Maternal Grandmother   . Heart disease Maternal Grandmother   . Heart disease Maternal Grandfather   . Diabetes Paternal Grandfather   . Lung cancer Brother        smoker  . Bladder Cancer Brother     Allergies  Allergen Reactions  . Nuvigil [Armodafinil] Other (See Comments)    Seizures   . Seroquel [Quetiapine] Other (See Comments)    Seizures.  . Simvastatin Hives    Current Outpatient Medications on File Prior to Visit  Medication Sig Dispense Refill  . albuterol (VENTOLIN HFA) 108 (90 Base) MCG/ACT inhaler Inhale 1-2 puffs into the lungs every 4 (four) hours as needed for shortness of breath.    . betamethasone dipropionate (DIPROLENE) 0.05 % cream Apply 1 application topically daily.   3  . Cholecalciferol (VITAMIN D3) 5000 units CAPS Take 1 capsule by mouth as directed.     . diphenhydrAMINE (BENADRYL) 25 MG tablet Take 25 mg by mouth daily.    Marland Kitchen omeprazole (PRILOSEC) 20 MG capsule Take 1 capsule (20 mg total) by mouth daily. 60 capsule 3  . spironolactone (ALDACTONE) 25 MG tablet Take 1 tablet (25 mg total) by mouth daily. 90 tablet 3   No current facility-administered medications on file prior to visit.    BP 122/82   Pulse 85   Temp (!) 96.2 F (35.7 C) (Temporal)   Ht 5\' 10"  (1.778 m)   Wt 294 lb 4 oz (133.5 kg)   SpO2 95%   BMI 42.22 kg/m    Objective:   Physical Exam  Constitutional: He appears well-nourished.  Cardiovascular: Normal rate and regular rhythm.  Respiratory: Effort normal and breath  sounds normal.  Dry cough during visit, improved since last visit  Musculoskeletal:     Cervical back: Neck supple.  Skin: Skin is warm and dry.  Psychiatric: He has a normal mood and affect.           Assessment & Plan:

## 2020-01-12 NOTE — Assessment & Plan Note (Signed)
Stable on losartan 50 mg, cough is improving. Continue same. CMP pending.

## 2020-01-16 ENCOUNTER — Other Ambulatory Visit: Payer: Self-pay | Admitting: General Surgery

## 2020-06-14 ENCOUNTER — Ambulatory Visit: Payer: No Typology Code available for payment source | Admitting: Primary Care

## 2020-06-14 ENCOUNTER — Other Ambulatory Visit: Payer: Self-pay

## 2020-06-14 VITALS — BP 132/86 | HR 82 | Temp 96.2°F | Ht 70.0 in | Wt 301.0 lb

## 2020-06-14 DIAGNOSIS — R748 Abnormal levels of other serum enzymes: Secondary | ICD-10-CM

## 2020-06-14 DIAGNOSIS — E538 Deficiency of other specified B group vitamins: Secondary | ICD-10-CM | POA: Diagnosis not present

## 2020-06-14 DIAGNOSIS — R7303 Prediabetes: Secondary | ICD-10-CM | POA: Diagnosis not present

## 2020-06-14 DIAGNOSIS — R5382 Chronic fatigue, unspecified: Secondary | ICD-10-CM

## 2020-06-14 DIAGNOSIS — J029 Acute pharyngitis, unspecified: Secondary | ICD-10-CM | POA: Insufficient documentation

## 2020-06-14 HISTORY — DX: Abnormal levels of other serum enzymes: R74.8

## 2020-06-14 LAB — COMPREHENSIVE METABOLIC PANEL
ALT: 30 U/L (ref 0–53)
AST: 31 U/L (ref 0–37)
Albumin: 4.3 g/dL (ref 3.5–5.2)
Alkaline Phosphatase: 53 U/L (ref 39–117)
BUN: 12 mg/dL (ref 6–23)
CO2: 30 mEq/L (ref 19–32)
Calcium: 9.3 mg/dL (ref 8.4–10.5)
Chloride: 101 mEq/L (ref 96–112)
Creatinine, Ser: 0.95 mg/dL (ref 0.40–1.50)
GFR: 82.02 mL/min (ref >60.00)
Glucose, Bld: 112 mg/dL — ABNORMAL HIGH (ref 70–99)
Potassium: 5.4 mEq/L — ABNORMAL HIGH (ref 3.5–5.1)
Sodium: 137 mEq/L (ref 135–145)
Total Bilirubin: 0.6 mg/dL (ref 0.2–1.2)
Total Protein: 7 g/dL (ref 6.0–8.3)

## 2020-06-14 LAB — CBC WITH DIFFERENTIAL/PLATELET
Basophils Absolute: 0.1 10*3/uL (ref 0.0–0.1)
Basophils Relative: 1.2 % (ref 0.0–3.0)
Eosinophils Absolute: 0.4 10*3/uL (ref 0.0–0.7)
Eosinophils Relative: 4.7 % (ref 0.0–5.0)
HCT: 47.9 % (ref 39.0–52.0)
Hemoglobin: 16.7 g/dL (ref 13.0–17.0)
Lymphocytes Relative: 26.4 % (ref 12.0–46.0)
Lymphs Abs: 2.3 10*3/uL (ref 0.7–4.0)
MCHC: 34.9 g/dL (ref 30.0–36.0)
MCV: 91 fl (ref 78.0–100.0)
Monocytes Absolute: 0.8 10*3/uL (ref 0.1–1.0)
Monocytes Relative: 9.6 % (ref 3.0–12.0)
Neutro Abs: 5.1 10*3/uL (ref 1.4–7.7)
Neutrophils Relative %: 58.1 % (ref 43.0–77.0)
Platelets: 251 10*3/uL (ref 150.0–400.0)
RBC: 5.27 Mil/uL (ref 4.22–5.81)
RDW: 13.7 % (ref 11.5–15.5)
WBC: 8.7 10*3/uL (ref 4.0–10.5)

## 2020-06-14 LAB — TSH: TSH: 2.07 u[IU]/mL (ref 0.35–4.50)

## 2020-06-14 LAB — POCT RAPID STREP A (OFFICE): Rapid Strep A Screen: NEGATIVE

## 2020-06-14 LAB — HEMOGLOBIN A1C: Hgb A1c MFr Bld: 6.7 % — ABNORMAL HIGH (ref 4.6–6.5)

## 2020-06-14 LAB — CK: Total CK: 510 U/L — ABNORMAL HIGH (ref 7–232)

## 2020-06-14 LAB — VITAMIN B12: Vitamin B-12: 170 pg/mL — ABNORMAL LOW (ref 211–911)

## 2020-06-14 NOTE — Assessment & Plan Note (Signed)
Not currently taking B12 supplements. Repeat level pending.

## 2020-06-14 NOTE — Assessment & Plan Note (Signed)
Chronic, checking TSH, A1C, CMP.

## 2020-06-14 NOTE — Assessment & Plan Note (Signed)
A1C of 6.5 during last visit which is technically a diagnosis of type 2 diabetes. He declined treatment at the time of diagnosis. Repeat A1C pending today.

## 2020-06-14 NOTE — Assessment & Plan Note (Addendum)
Acute for the last four days, painful swallowing. Exam today with mild swelling to tonsils, no cervical adenopathy or tenderness.  Rapid strep negative.  I didn't appreciate any masses on exam. He appears well, not sickly. No fevers.   He will start a course of NSAID treatment for a few days and update.

## 2020-06-14 NOTE — Progress Notes (Signed)
Subjective:    Patient ID: Evan [name], male    DOB: [birth year], 54 y.o.   MRN: [id number]  HPI  This visit occurred during the SARS-CoV-2 public health emergency.  Safety protocols were in place, including screening questions prior to the visit, additional usage of staff PPE, and extensive cleaning of exam room while observing appropriate contact time as indicated for disinfecting solutions.   Evan [name] is a 54 year old male with a history of unstable angina, hypertension, fatigue, chronic cough, arthralgia, nephrectomy who presents today with a chief complaint of throat swelling.  His swelling is located to the back of the right throat and right lateral neck, below the mandible. He also endorses painful and difficulty swallowing on the right side, fatigue. Symptoms began about 4 days ago. A few days ago he did notice a "string" hanging off of his tonsil after choking for a bit, he was eating dessert.   He denies fevers, chills, cough. He's not taken anything OTC for symptoms.   He is also due for repeat A1C and CK.   Review of Systems  Constitutional: Positive for fatigue.  HENT: Positive for postnasal drip and trouble swallowing. Negative for congestion.        Painful swallowing  Respiratory: Negative for cough.        Past Medical History:  Diagnosis Date  . Arthritis   . Chronic kidney disease   . Colon polyps    benign per pt  . Diverticulitis   . Hyperlipidemia   . Hypertension   . Lyme disease   . Seizures ([place])      Social History   Socioeconomic History  . Marital status: Married    Spouse name: Not on file  . Number of children: 4  . Years of education: Not on file  . Highest education level: Not on file  Occupational History  . Occupation: Freight forwarder  Tobacco Use  . Smoking status: Never Smoker  . Smokeless tobacco: Never Used  Vaping Use  . Vaping Use: Never used  Substance and Sexual Activity  . Alcohol use: Yes    Comment: occasional  . Drug  use: No  . Sexual activity: Not on file  Other Topics Concern  . Not on file  Social History Narrative  . Not on file   Social Determinants of Health   Financial Resource Strain:   . Difficulty of Paying Living Expenses:   Food Insecurity:   . Worried About Charity fundraiser in the Last Year:   . Arboriculturist in the Last Year:   Transportation Needs:   . Film/video editor (Medical):   Marland Kitchen Lack of Transportation (Non-Medical):   Physical Activity:   . Days of Exercise per Week:   . Minutes of Exercise per Session:   Stress:   . Feeling of Stress :   Social Connections:   . Frequency of Communication with Friends and Family:   . Frequency of Social Gatherings with Friends and Family:   . Attends Religious Services:   . Active Member of Clubs or Organizations:   . Attends Archivist Meetings:   Marland Kitchen Marital Status:   Intimate Partner Violence:   . Fear of Current or Ex-Partner:   . Emotionally Abused:   Marland Kitchen Physically Abused:   . Sexually Abused:     Past Surgical History:  Procedure Laterality Date  . HERNIA REPAIR     x 2  . LEFT HEART CATH AND  CORONARY ANGIOGRAPHY N/A 05/04/2018   Procedure: LEFT HEART CATH AND CORONARY ANGIOGRAPHY;  Surgeon: Leonie Man, MD;  Location: [facility];  Service: Cardiovascular;  Laterality: N/A;  . NECK SURGERY    . NEPHRECTOMY Left 2010   for atrophy and reflux  . PROSTATE BIOPSY    . SHOULDER SURGERY Left     Family History  Problem Relation Age of Onset  . Arthritis Mother   . Heart disease Mother   . Hypertension Mother   . Kidney disease Mother   . Colon cancer Father        dx in his early 17's  . Lung cancer Father   . Prostate cancer Father   . Breast cancer Father        5 or 70  . Lung cancer Brother        smoker  . Lung cancer Paternal Aunt   . Colon cancer Paternal Uncle   . Diabetes Paternal Uncle   . Arthritis Maternal Grandmother   . Colon cancer Maternal Grandmother   .  Heart disease Maternal Grandmother   . Heart disease Maternal Grandfather   . Diabetes Paternal Grandfather   . Lung cancer Brother        smoker  . Bladder Cancer Brother     Allergies  Allergen Reactions  . Nuvigil [Armodafinil] Other (See Comments)    Seizures   . Seroquel [Quetiapine] Other (See Comments)    Seizures.  . Simvastatin Hives    Current Outpatient Medications on File Prior to Visit  Medication Sig Dispense Refill  . betamethasone dipropionate (DIPROLENE) 0.05 % cream Apply 1 application topically daily.   3  . Cholecalciferol (VITAMIN D3) 5000 units CAPS Take 1 capsule by mouth as directed.     . diphenhydrAMINE (BENADRYL) 25 MG tablet Take 25 mg by mouth daily.    . fluticasone (FLONASE) 50 MCG/ACT nasal spray Place 1 spray into both nostrils 2 (two) times daily. 16 g 0  . losartan (COZAAR) 50 MG tablet Take 1 tablet (50 mg total) by mouth daily. For blood pressure. 90 tablet 3  . omeprazole (PRILOSEC) 20 MG capsule Take 1 capsule (20 mg total) by mouth daily. 60 capsule 3  . spironolactone (ALDACTONE) 25 MG tablet Take 1 tablet (25 mg total) by mouth daily. 90 tablet 3   No current facility-administered medications on file prior to visit.    BP 132/86   Pulse 82   Temp (!) 96.2 F (35.7 C) (Temporal)   Ht 5\' 10"  (1.778 m)   Wt (!) 301 lb (136.5 kg)   SpO2 98%   BMI 43.19 kg/m    Objective:   Physical Exam HENT:     Mouth/Throat:     Pharynx: Posterior oropharyngeal erythema present. No oropharyngeal exudate.     Comments: Mild swelling to tonsils, but difficult to examine due to large tongue size and inability of patient to relax tongue. Cardiovascular:     Rate and Rhythm: Normal rate and regular rhythm.  Pulmonary:     Effort: Pulmonary effort is normal.     Breath sounds: Normal breath sounds.  Neurological:     Mental Status: He is alert.            Assessment & Plan:

## 2020-06-14 NOTE — Patient Instructions (Addendum)
Stop by the lab prior to leaving today. I will notify you of your results once received.   Try a course of anti-inflammatory medication for the next 3-4 days.   We'll be in touch soon!  Allie Bossier, NP-C

## 2020-06-17 DIAGNOSIS — E119 Type 2 diabetes mellitus without complications: Secondary | ICD-10-CM

## 2020-06-18 MED ORDER — METFORMIN HCL ER 500 MG PO TB24: 500.0000 mg | ORAL_TABLET | Freq: Every day | ORAL | 1 refills | Status: DC

## 2020-08-02 ENCOUNTER — Encounter: Payer: Self-pay | Admitting: Podiatry

## 2020-08-02 ENCOUNTER — Other Ambulatory Visit: Payer: Self-pay

## 2020-08-02 ENCOUNTER — Ambulatory Visit (INDEPENDENT_AMBULATORY_CARE_PROVIDER_SITE_OTHER): Payer: No Typology Code available for payment source | Admitting: Podiatry

## 2020-08-02 DIAGNOSIS — L600 Ingrowing nail: Secondary | ICD-10-CM | POA: Diagnosis not present

## 2020-08-02 MED ORDER — NEOMYCIN-POLYMYXIN-HC 3.5-10000-1 OT SOLN
3.0000 [drp] | Freq: Four times a day (QID) | OTIC | 0 refills | Status: DC
Start: 2020-08-02 — End: 2021-02-14

## 2020-08-02 NOTE — Patient Instructions (Signed)

## 2020-08-07 NOTE — Progress Notes (Signed)
Subjective:   Patient ID: Evan [name], male   DOB: 54 y.o.   MRN: [id number]   HPI Patient presents with chronic ingrown toenails of both feet stating they have been sore and making it hard for him to wear shoe gear comfortably.  States he is tried to trim them over the years soak them and they just are slowly getting worse over time.  Patient does not smoke likes to be active   Review of Systems  All other systems reviewed and are negative.       Objective:  Physical Exam Vitals and nursing note reviewed.  Constitutional:      Appearance: He is well-developed.  Pulmonary:     Effort: Pulmonary effort is normal.  Musculoskeletal:        General: Normal range of motion.  Skin:    General: Skin is warm.  Neurological:     Mental Status: He is alert.     Neurovascular status intact muscle strength found to be adequate range of motion within normal limits.  I was noted to have incurvated hallux nails bilateral medial borders that are painful when pressed with no active drainage or redness noted.  He is moderately obese which is complicating factor for trying to take care of nails himself.  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  I explained procedure risk and patient wants surgery and today I infiltrated each hallux 60 mg like Marcaine mixture sterile prep done using sterile instrumentation I remove the borders exposed matrix and applied phenol three applications 30 seconds followed by alcohol lavage sterile dressing.  I gave instructions on soaks and to leave dressings on 24 hours but take them off earlier if any throbbing were to occur.  Drops written encouraged to call with questions concerns

## 2020-08-09 ENCOUNTER — Other Ambulatory Visit: Payer: Self-pay | Admitting: Gastroenterology

## 2020-08-09 DIAGNOSIS — R1011 Right upper quadrant pain: Secondary | ICD-10-CM

## 2020-08-10 IMAGING — CR DG CHEST 2V
2 series · 2 of 2 positions shown · non-contrast
Comparison: Chest x-ray dated 05/03/2018

CLINICAL DATA: RIGHT upper quadrant abdominal pain radiating to
RIGHT shoulder.

EXAM:
CHEST - 2 VIEW

[chest pa]
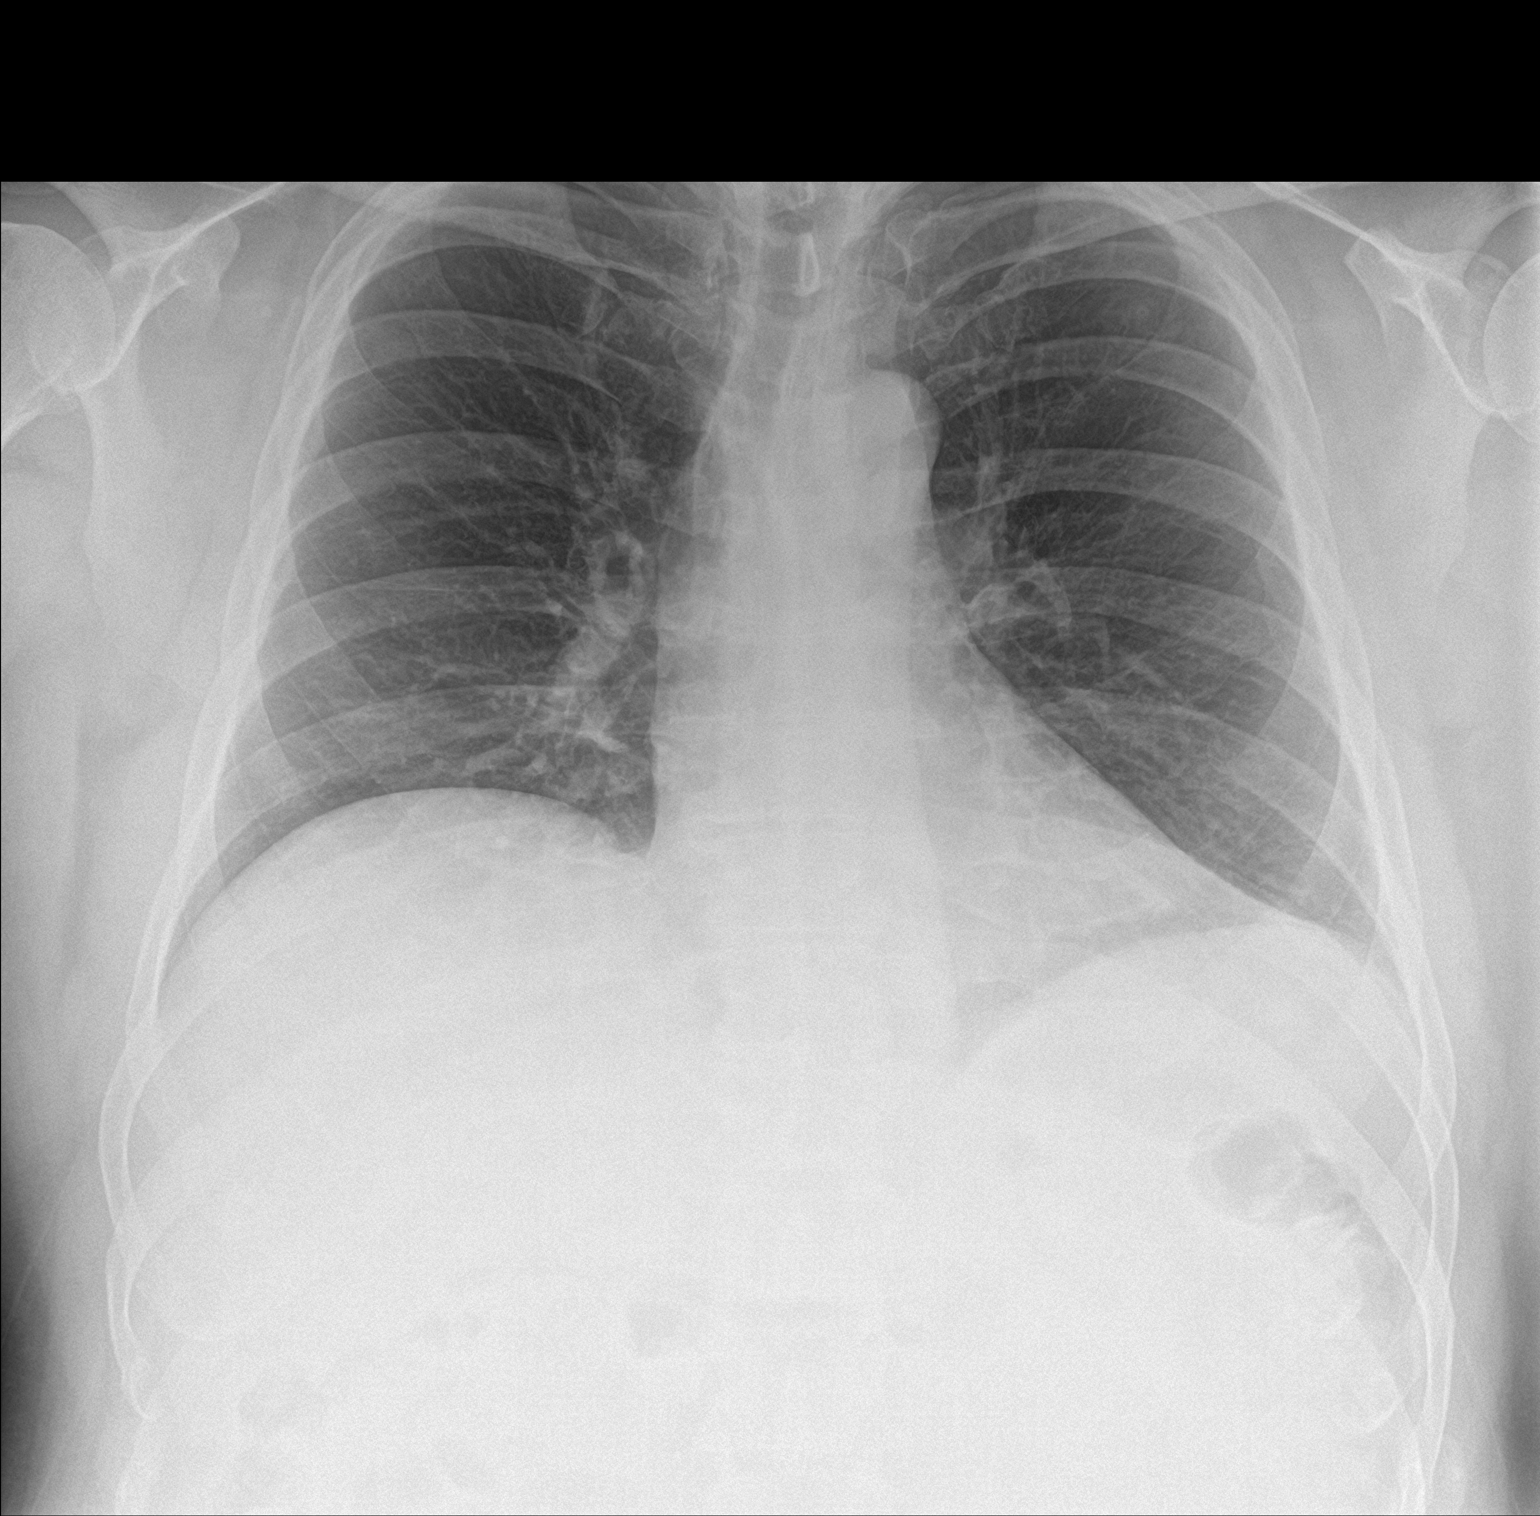

[chest lat]
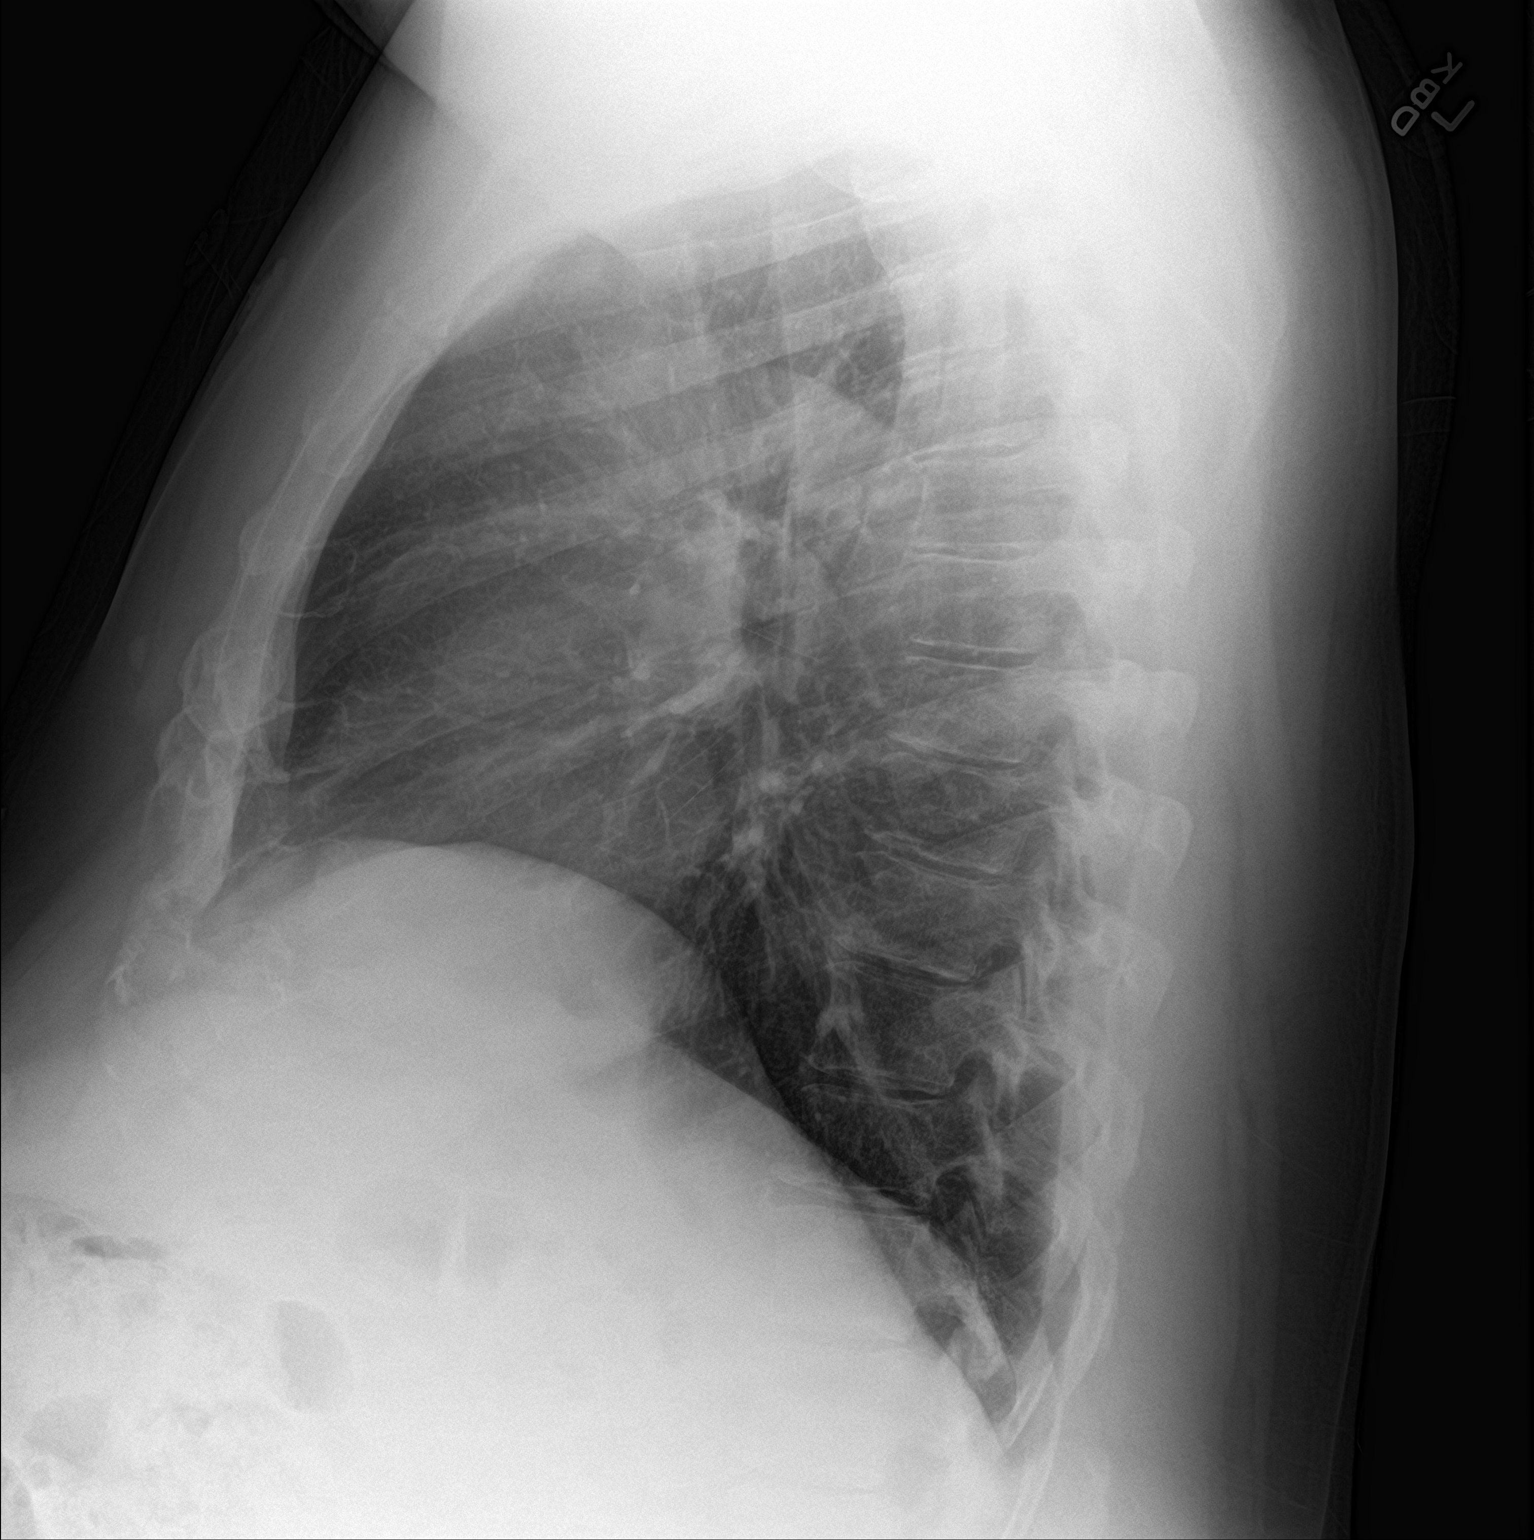

[2 of 2 positions shown; findings below may reference images not displayed]

FINDINGS: The heart size and mediastinal contours are within normal limits.
Both lungs are clear. The visualized skeletal structures are
unremarkable.
IMPRESSION: No active cardiopulmonary disease.  No evidence of pneumonia.

## 2020-08-30 ENCOUNTER — Other Ambulatory Visit: Payer: No Typology Code available for payment source

## 2020-09-06 ENCOUNTER — Ambulatory Visit
Admission: RE | Admit: 2020-09-06 | Discharge: 2020-09-06 | Disposition: A | Discharge status: Home / Self Care | Payer: No Typology Code available for payment source | Source: Ambulatory Visit | Attending: Gastroenterology | Admitting: Gastroenterology

## 2020-09-06 DIAGNOSIS — R1011 Right upper quadrant pain: Secondary | ICD-10-CM

## 2020-09-06 MED ORDER — IOPAMIDOL (ISOVUE-300) INJECTION 61%
100.0000 mL | Freq: Once | INTRAVENOUS | Status: AC | PRN
  Administered 2020-09-06: 100 mL via INTRAVENOUS

## 2020-11-06 ENCOUNTER — Emergency Department (HOSPITAL_COMMUNITY): Payer: No Typology Code available for payment source

## 2020-11-06 ENCOUNTER — Other Ambulatory Visit: Payer: Self-pay

## 2020-11-06 ENCOUNTER — Encounter (HOSPITAL_COMMUNITY): Payer: Self-pay

## 2020-11-06 ENCOUNTER — Emergency Department (HOSPITAL_COMMUNITY)
Admission: EM | Admit: 2020-11-06 | Discharge: 2020-11-06 | Disposition: A | Discharge status: Home / Self Care | Payer: No Typology Code available for payment source | Attending: Emergency Medicine | Admitting: Emergency Medicine

## 2020-11-06 DIAGNOSIS — W19XXXA Unspecified fall, initial encounter: Secondary | ICD-10-CM | POA: Diagnosis not present

## 2020-11-06 DIAGNOSIS — N189 Chronic kidney disease, unspecified: Secondary | ICD-10-CM | POA: Insufficient documentation

## 2020-11-06 DIAGNOSIS — I129 Hypertensive chronic kidney disease with stage 1 through stage 4 chronic kidney disease, or unspecified chronic kidney disease: Secondary | ICD-10-CM | POA: Insufficient documentation

## 2020-11-06 DIAGNOSIS — S79911A Unspecified injury of right hip, initial encounter: Secondary | ICD-10-CM | POA: Insufficient documentation

## 2020-11-06 DIAGNOSIS — Z79899 Other long term (current) drug therapy: Secondary | ICD-10-CM | POA: Insufficient documentation

## 2020-11-06 DIAGNOSIS — M25551 Pain in right hip: Secondary | ICD-10-CM

## 2020-11-06 DIAGNOSIS — M25511 Pain in right shoulder: Secondary | ICD-10-CM | POA: Diagnosis not present

## 2020-11-06 LAB — COMPREHENSIVE METABOLIC PANEL
ALT: 40 U/L (ref 0–44)
AST: 30 U/L (ref 15–41)
Albumin: 3.8 g/dL (ref 3.5–5.0)
Alkaline Phosphatase: 48 U/L (ref 38–126)
Anion gap: 10 (ref 5–15)
BUN: 16 mg/dL (ref 6–20)
CO2: 25 mmol/L (ref 22–32)
Calcium: 8.9 mg/dL (ref 8.9–10.3)
Chloride: 101 mmol/L (ref 98–111)
Creatinine, Ser: 0.93 mg/dL (ref 0.61–1.24)
GFR, Estimated: >60 mL/min (ref >60)
Glucose, Bld: 195 mg/dL — ABNORMAL HIGH (ref 70–99)
Potassium: 3.9 mmol/L (ref 3.5–5.1)
Sodium: 136 mmol/L (ref 135–145)
Total Bilirubin: 0.7 mg/dL (ref 0.3–1.2)
Total Protein: 6.8 g/dL (ref 6.5–8.1)

## 2020-11-06 LAB — CBC WITH DIFFERENTIAL/PLATELET
Abs Immature Granulocytes: 0.02 10*3/uL (ref 0.00–0.07)
Basophils Absolute: 0.1 10*3/uL (ref 0.0–0.1)
Basophils Relative: 1 %
Eosinophils Absolute: 0.3 10*3/uL (ref 0.0–0.5)
Eosinophils Relative: 3 %
HCT: 46 % (ref 39.0–52.0)
Hemoglobin: 16.3 g/dL (ref 13.0–17.0)
Immature Granulocytes: 0 %
Lymphocytes Relative: 21 %
Lymphs Abs: 1.9 10*3/uL (ref 0.7–4.0)
MCH: 32.3 pg (ref 26.0–34.0)
MCHC: 35.4 g/dL (ref 30.0–36.0)
MCV: 91.3 fL (ref 80.0–100.0)
Monocytes Absolute: 0.8 10*3/uL (ref 0.1–1.0)
Monocytes Relative: 9 %
Neutro Abs: 5.9 10*3/uL (ref 1.7–7.7)
Neutrophils Relative %: 66 %
Platelets: 231 10*3/uL (ref 150–400)
RBC: 5.04 MIL/uL (ref 4.22–5.81)
RDW: 12.9 % (ref 11.5–15.5)
WBC: 9 10*3/uL (ref 4.0–10.5)
nRBC: 0 % (ref 0.0–0.2)

## 2020-11-06 LAB — CK: Total CK: 424 U/L — ABNORMAL HIGH (ref 49–397)

## 2020-11-06 LAB — MAGNESIUM: Magnesium: 1.9 mg/dL (ref 1.7–2.4)

## 2020-11-06 MED ORDER — HYDROCODONE-ACETAMINOPHEN 5-325 MG PO TABS
1.0000 | ORAL_TABLET | ORAL | 0 refills | Status: DC | PRN
Start: 2020-11-06 — End: 2021-02-14

## 2020-11-06 MED ORDER — LIDOCAINE 5 % EX PTCH: 1.0000 | MEDICATED_PATCH | CUTANEOUS | 0 refills | Status: DC

## 2020-11-06 MED ORDER — FENTANYL CITRATE (PF) 100 MCG/2ML IJ SOLN
50.0000 ug | Freq: Once | INTRAMUSCULAR | Status: AC
  Administered 2020-11-06: 50 ug via INTRAVENOUS
  Filled 2020-11-06: qty 2

## 2020-11-06 NOTE — ED Triage Notes (Signed)
Pt arrived via POV, states mechanical fall last night, pain to right shoulder and right leg, states when he fell he "felt a pop", intense pain since, inability to straighten leg or bear weight to that side. Denies any LOC from fall.

## 2020-11-06 NOTE — ED Provider Notes (Signed)
[place] DEPT Provider Note   CSN: [id number] Arrival date & time: 11/06/20  1155     History Chief Complaint  Patient presents with  . Hip Pain  . Shoulder Pain    Evan [name] is a 54 y.o. male.   Hip Pain This is a new problem. The current episode started yesterday. The problem occurs constantly. The problem has not changed since onset.Pertinent negatives include no chest pain, no abdominal pain, no headaches and no shortness of breath. The symptoms are aggravated by standing and walking. Nothing relieves the symptoms. He has tried nothing for the symptoms. The treatment provided no relief.  Shoulder Pain Associated symptoms: no back pain, no fatigue and no neck pain        Past Medical History:  Diagnosis Date  . Arthritis   . Chronic kidney disease   . Colon polyps    benign per pt  . Diverticulitis   . Hyperlipidemia   . Hypertension   . Lyme disease   . Seizures [facility])     Patient Active Problem List   Diagnosis Date Noted  . Prediabetes 06/14/2020  . Elevated CK 06/14/2020  . Sore throat 06/14/2020  . Seasonal allergies 01/12/2020  . Chronic cough 01/05/2020  . Preventative health care 11/02/2019  . Vitamin D deficiency 09/04/2019  . B12 deficiency 09/04/2019  . Chronic fatigue 09/04/2019  . Generalized abdominal pain 11/04/2018  . Myalgia 11/04/2018  . Unstable angina ([place])   . Chest pain, moderate coronary artery risk   . 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 Essential hypertension 04/14/2017  . Hyperlipidemia 04/14/2017  . Arthralgia 04/14/2017    Past Surgical History:  Procedure Laterality Date  . HERNIA REPAIR     x 2  . LEFT HEART CATH AND CORONARY ANGIOGRAPHY N/A 05/04/2018   Procedure: LEFT HEART CATH AND CORONARY ANGIOGRAPHY;  Surgeon: Leonie Man, MD;  Location: [facility];  Service: Cardiovascular;  Laterality: N/A;  . NECK SURGERY    . NEPHRECTOMY Left 2010   for atrophy and reflux   . PROSTATE BIOPSY    . SHOULDER SURGERY Left        Family History  Problem Relation Age of Onset  . Arthritis Mother   . Heart disease Mother   . Hypertension Mother   . Kidney disease Mother   . Colon cancer Father        dx in his early 90's  . Lung cancer Father   . Prostate cancer Father   . Breast cancer Father        43 or 9  . Lung cancer Brother        smoker  . Lung cancer Paternal Aunt   . Colon cancer Paternal Uncle   . Diabetes Paternal Uncle   . Arthritis Maternal Grandmother   . Colon cancer Maternal Grandmother   . Heart disease Maternal Grandmother   . Heart disease Maternal Grandfather   . Diabetes Paternal Grandfather   . Lung cancer Brother        smoker  . Bladder Cancer Brother     Social History   Tobacco Use  . Smoking status: Never Smoker  . Smokeless tobacco: Never Used  Vaping Use  . Vaping Use: Never used  Substance Use Topics  . Alcohol use: Yes    Comment: occasional  . Drug use: No    Home Medications Prior to Admission medications   Medication Sig Start Date End Date  Taking? Authorizing Provider  betamethasone dipropionate (DIPROLENE) 0.05 % cream Apply 1 application topically daily.  02/16/18   [provider]  Cholecalciferol (VITAMIN D3) 5000 units CAPS Take 1 capsule by mouth as directed.     [provider]  diphenhydrAMINE (BENADRYL) 25 MG tablet Take 25 mg by mouth daily.    [provider]  fluticasone (FLONASE) 50 MCG/ACT nasal spray Place 1 spray into both nostrils 2 (two) times daily. 01/12/20   [name], NP  losartan (COZAAR) 50 MG tablet Take 1 tablet (50 mg total) by mouth daily. For blood pressure. 01/12/20   [name], NP  metFORMIN (GLUCOPHAGE XR) 500 MG 24 hr tablet Take 1 tablet (500 mg total) by mouth daily with breakfast. For diabetes. 06/18/20   [name], NP  mupirocin ointment (BACTROBAN) 2 % Apply 1 application topically daily. 06/07/20   [provider]  neomycin-polymyxin-hydrocortisone (CORTISPORIN) OTIC solution Place 3 drops into both ears 4 (four) times daily. 08/02/20   Wallene Huh, DPM  omeprazole (PRILOSEC) 20 MG capsule Take 1 capsule (20 mg total) by mouth daily. 05/04/18   Danford, Suann Larry, MD  spironolactone (ALDACTONE) 25 MG tablet Take 1 tablet (25 mg total) by mouth daily. 11/23/19   Jerline Pain, MD    Allergies    Nuvigil [armodafinil], Seroquel [quetiapine], and Simvastatin  Review of Systems   Review of Systems  Constitutional: Negative for chills, diaphoresis and fatigue.  HENT: Negative for congestion.   Eyes: Negative for visual disturbance.  Respiratory: Negative for cough, chest tightness, shortness of breath and wheezing.   Cardiovascular: Negative for chest pain, palpitations and leg swelling.  Gastrointestinal: Negative for abdominal pain, constipation, diarrhea, nausea and vomiting.  Genitourinary: Negative for dysuria and flank pain.  Musculoskeletal: Negative for back pain, neck pain and neck stiffness.  Skin: Negative for rash and wound.  Neurological: Positive for numbness. Negative for seizures, weakness, light-headedness and headaches.  Psychiatric/Behavioral: Negative for agitation.  All other systems reviewed and are negative.   Physical Exam Updated Vital Signs BP (!) 165/110 (BP Location: Left Arm)   Pulse 100   Temp 97.9 F (36.6 C) (Oral)   Resp 18   SpO2 94%   Physical Exam Vitals and nursing note reviewed.  Constitutional:      General: He is not in acute distress.    Appearance: He is well-developed. He is not ill-appearing, toxic-appearing or diaphoretic.  HENT:     Head: Normocephalic and atraumatic.  Eyes:     Conjunctiva/sclera: Conjunctivae normal.  Cardiovascular:     Rate and Rhythm: Normal rate and regular rhythm.     Heart sounds: No murmur heard.   Pulmonary:     Effort: Pulmonary effort is normal. No respiratory distress.     Breath  sounds: Normal breath sounds. No wheezing, rhonchi or rales.  Chest:     Chest wall: No tenderness.  Abdominal:     General: Abdomen is flat.     Palpations: Abdomen is soft.     Tenderness: There is no abdominal tenderness. There is no right CVA tenderness, left CVA tenderness, guarding or rebound.  Musculoskeletal:        General: Tenderness and signs of injury present.       Arms:     Cervical back: Neck supple.     Right hip: Tenderness and bony tenderness present. Normal range of motion. Normal strength.       Legs:  Comments: Mild reported right leg tingling compared to left.  Normal pulses and strength in the right leg.  Tenderness in right shoulder and right upper lateral back.  Some pain with range of motion of the shoulder but full range of motion present.  He could touch his right hand to his left shoulder, doubt dislocation.  Normal grip strength, pulse, sensation in the hand.  Muscle spasms present in the arm.  Skin:    General: Skin is warm and dry.     Capillary Refill: Capillary refill takes less than 2 seconds.     Findings: No erythema.  Neurological:     Mental Status: He is alert.     Sensory: Sensory deficit (tinglingin R leg) present.     Motor: No weakness.  Psychiatric:        Mood and Affect: Mood normal.     ED Results / Procedures / Treatments   Labs (all labs ordered are listed, but only abnormal results are displayed) Labs Reviewed  COMPREHENSIVE METABOLIC PANEL - Abnormal; Notable for the following components:      Result Value   Glucose, Bld 195 (*)    All other components within normal limits  CK - Abnormal; Notable for the following components:   Total CK 424 (*)    All other components within normal limits  CBC WITH DIFFERENTIAL/PLATELET  MAGNESIUM    EKG None  Radiology DG Chest 2 View  Result Date: 11/06/2020 CLINICAL DATA:  Pain following fall EXAM: CHEST - 2 VIEW COMPARISON:  November 17, 2019 FINDINGS: Lungs are clear.  Heart size and pulmonary vascularity are normal. No adenopathy. No pneumothorax. Evidence of old trauma involving the lateral left clavicle, stable. Postoperative change noted in the lower cervical spine. No acute fracture evident. IMPRESSION: Lungs clear. Heart size normal. Prior trauma distal left clavicle, unchanged. No pneumothorax. Electronically Signed   By: Lowella Grip III M.D.   On: 11/06/2020 14:02   DG Shoulder Right  Result Date: 11/06/2020 CLINICAL DATA:  Pain following fall EXAM: RIGHT SHOULDER - 2+ VIEW COMPARISON:  None. FINDINGS: Oblique and Y scapular images were obtained. No appreciable fracture or dislocation. No joint space narrowing or erosion. Relative lucency in the greater tuberosity region may represent localized osteoporosis. Visualized right lung clear. IMPRESSION: Question localized osteoporosis in the greater tuberosity region. No fracture or dislocation. No underlying arthropathic change. Electronically Signed   By: Lowella Grip III M.D.   On: 11/06/2020 14:01   DG Hip Unilat W or Wo Pelvis 2-3 Views Right  Result Date: 11/06/2020 CLINICAL DATA:  Pain following fall EXAM: DG HIP (WITH OR WITHOUT PELVIS) 2-3V RIGHT COMPARISON:  None. FINDINGS: Frontal pelvis as well as frontal and lateral right hip images were obtained. No fracture or dislocation. Joint spaces appear normal. No erosive change. IMPRESSION: No fracture or dislocation.  No evident arthropathy. Electronically Signed   By: Lowella Grip III M.D.   On: 11/06/2020 14:00    Procedures Procedures (including critical care time)  Medications Ordered in ED Medications  fentaNYL (SUBLIMAZE) injection 50 mcg (50 mcg Intravenous Given 11/06/20 1358)    ED Course  I have reviewed the triage vital signs and the nursing notes.  Pertinent labs & imaging results that were available during my care of the patient were reviewed by me and considered in my medical decision making (see chart for  details).    MDM Rules/Calculators/A&P  Nicodemus Denk is a 54 y.o. male with a past medical history significant for unknown muscle problem with elevated CK levels, chronic muscle spasms, hypertension, hyperlipidemia, prior nephrectomy, and arthritis who presents with a fall.  He reports that he was in the hot tub last night with his wife and after getting out, slipped off a step falling hard onto his right hip and right shoulder.  He reports he got his head or lose consciousness.  He denies any new neck pain or back pain but does report severe pain in his right hip and right shoulder and right upper lateral back.  He says that he has not been able to bear weight on it on the right side.  He has never had a hip fracture or hip dislocation in the past.  He reports he has had left shoulder surgery before but never any right shoulder symptoms.  He describes the pain is severe.  He denies any other chest pain, shortness breath, nausea, vomiting.  Denies any weakness in the foot but does report his right leg is feeling slightly numb with the pain.  He denies any numbness or weakness in his right arm.  He is right-handed.  On exam, patient has tenderness in the right shoulder and right upper back laterally.  Breath sounds clear and chest nontender.  Abdomen nontender.  Right posterior hip area is tender to palpation.  He had pain with hip manipulation.  He did have intact pulses and strength in the foot but he had some reported paresthesia and numbness/tingling in his right leg compared to left.  Clinically I'm concerned that he either has a fracture dislocation his right hip and likely more soft tissue injuries in his right shoulder and right upper back/lateral chest wall.  Breath sounds are equal, doubt pneumothorax but will get x-ray of his chest, shoulder x-ray, and imaging of his right hip/pelvis.  Patient reports he does not want any pain medicine at this time.  Anticipate  reassessment after work-up.  X-ray showed no fracture dislocation of the shoulder or the hip.  X-ray of the chest showed no pneumothorax or rib fractures.  I suspect soft tissue injury and possibly tenderness or ligamentous injury.  Patient was able to bend and move his leg with pain.  Given the x-ray showing absolutely no abnormality or acute fracture, do not feel advanced imaging is needed in the emergency department however I do suspect he may need advanced imaging as an outpatient with orthopedics to further delineate if there was any muscular injury or tendinous injury.  Patient will be given crutches as well as prescription for pain medicine and Lidoderm patches to help with the discomfort.  He will be careful not to fall again.  She understands return precautions and follow-up instructions.  Patient discharged in good condition for outpatient follow-up with orthopedics and further outpatient management.  Final Clinical Impression(s) / ED Diagnoses Final diagnoses:  Fall, initial encounter  Right hip pain  Acute pain of right shoulder    Rx / DC Orders ED Discharge Orders         Ordered    lidocaine (LIDODERM) 5 %  Every 24 hours        11/06/20 1613    HYDROcodone-acetaminophen (NORCO/VICODIN) 5-325 MG tablet  Every 4 hours PRN        11/06/20 1613    Ambulatory referral to Orthopedic Surgery        11/06/20 1616  Clinical Impression: 1. Fall, initial encounter   2. Right hip pain   3. Acute pain of right shoulder     Disposition: Discharge  Condition: Good  I have discussed the results, Dx and Tx plan with the pt(& family if present). He/she/they expressed understanding and agree(s) with the plan. Discharge instructions discussed at great length. Strict return precautions discussed and pt &/or family have verbalized understanding of the instructions. No further questions at time of discharge.    New Prescriptions   HYDROCODONE-ACETAMINOPHEN (NORCO/VICODIN)  5-325 MG TABLET    Take 1 tablet by mouth every 4 (four) hours as needed.   LIDOCAINE (LIDODERM) 5 %    Place 1 patch onto the skin daily. Remove & Discard patch within 12 hours or as directed by MD    Follow Up: [address] [place] [address] [phone]     [facility], [place] [address] [phone]     [facility]EMERGENCY [facility]PT [place] [phone] mc [address] [place] [address] [place], [place], [place] [address] [phone]        Phuoc Huy, Gwenyth Allegra, MD 11/06/20 [phone]

## 2020-11-06 NOTE — Discharge Instructions (Signed)
Your imaging today did not show evidence of acute fracture or dislocation.  We suspect you have soft tissue/ligamentous/tendinous injuries from the fall.  Please use the crutches to help with your mobility and use the pain medicine and numbing patch to help with the discomfort.  Please follow-up with orthopedics as soon as possible to discuss further advanced imaging and further management.  If any symptoms change or worsen, return to the nearest emergency department.

## 2020-11-06 NOTE — ED Notes (Signed)
Pt c/o increased pain after moving around for x-rays. Now requesting pain meds

## 2020-11-21 ENCOUNTER — Other Ambulatory Visit: Payer: Self-pay | Admitting: Cardiology

## 2020-11-28 ENCOUNTER — Telehealth: Payer: Self-pay | Admitting: Cardiology

## 2020-11-28 NOTE — Telephone Encounter (Signed)
    Pt said he is thinking changing provider and doesn't want to get calls for recalls. Deleted recalls

## 2020-12-14 ENCOUNTER — Other Ambulatory Visit: Payer: Self-pay | Admitting: Primary Care

## 2020-12-14 DIAGNOSIS — E119 Type 2 diabetes mellitus without complications: Secondary | ICD-10-CM

## 2021-01-20 ENCOUNTER — Other Ambulatory Visit: Payer: Self-pay | Admitting: Primary Care

## 2021-01-20 DIAGNOSIS — I10 Essential (primary) hypertension: Secondary | ICD-10-CM

## 2021-01-20 NOTE — Telephone Encounter (Signed)
Patient is overdue for general follow-up/CPE with me, please schedule. Also, notify him that I received a message from his pharmacy stating that his losartan is on backorder, all strength are on back order.  We need to switch him to olmesartan 20 mg which is close to the equivalent of losartan 50 mg. Have him follow-up within the next 30 days so we can see how well he is doing on the new medication.  This patient needs to be scheduled at the end of the session preferably.

## 2021-01-24 NOTE — Telephone Encounter (Signed)
Left message to return call to our office.  

## 2021-02-14 ENCOUNTER — Ambulatory Visit (INDEPENDENT_AMBULATORY_CARE_PROVIDER_SITE_OTHER): Payer: No Typology Code available for payment source | Admitting: Primary Care

## 2021-02-14 ENCOUNTER — Encounter: Payer: Self-pay | Admitting: Primary Care

## 2021-02-14 ENCOUNTER — Other Ambulatory Visit: Payer: Self-pay

## 2021-02-14 VITALS — BP 124/72 | HR 91 | Temp 97.8°F | Ht 70.0 in | Wt 297.2 lb

## 2021-02-14 DIAGNOSIS — E559 Vitamin D deficiency, unspecified: Secondary | ICD-10-CM

## 2021-02-14 DIAGNOSIS — R0683 Snoring: Secondary | ICD-10-CM

## 2021-02-14 DIAGNOSIS — M791 Myalgia, unspecified site: Secondary | ICD-10-CM

## 2021-02-14 DIAGNOSIS — I10 Essential (primary) hypertension: Secondary | ICD-10-CM

## 2021-02-14 DIAGNOSIS — R748 Abnormal levels of other serum enzymes: Secondary | ICD-10-CM | POA: Diagnosis not present

## 2021-02-14 DIAGNOSIS — Z0001 Encounter for general adult medical examination with abnormal findings: Secondary | ICD-10-CM

## 2021-02-14 DIAGNOSIS — R351 Nocturia: Secondary | ICD-10-CM | POA: Diagnosis not present

## 2021-02-14 DIAGNOSIS — Z125 Encounter for screening for malignant neoplasm of prostate: Secondary | ICD-10-CM

## 2021-02-14 DIAGNOSIS — R5382 Chronic fatigue, unspecified: Secondary | ICD-10-CM | POA: Diagnosis not present

## 2021-02-14 DIAGNOSIS — R053 Chronic cough: Secondary | ICD-10-CM

## 2021-02-14 DIAGNOSIS — Z860100 Personal history of colon polyps, unspecified: Secondary | ICD-10-CM

## 2021-02-14 DIAGNOSIS — E538 Deficiency of other specified B group vitamins: Secondary | ICD-10-CM | POA: Diagnosis not present

## 2021-02-14 DIAGNOSIS — R079 Chest pain, unspecified: Secondary | ICD-10-CM

## 2021-02-14 DIAGNOSIS — E119 Type 2 diabetes mellitus without complications: Secondary | ICD-10-CM

## 2021-02-14 DIAGNOSIS — M2550 Pain in unspecified joint: Secondary | ICD-10-CM

## 2021-02-14 DIAGNOSIS — E785 Hyperlipidemia, unspecified: Secondary | ICD-10-CM | POA: Diagnosis not present

## 2021-02-14 HISTORY — DX: Nocturia: R35.1

## 2021-02-14 LAB — COMPREHENSIVE METABOLIC PANEL
ALT: 32 U/L (ref 0–53)
AST: 26 U/L (ref 0–37)
Albumin: 4.2 g/dL (ref 3.5–5.2)
Alkaline Phosphatase: 45 U/L (ref 39–117)
BUN: 16 mg/dL (ref 6–23)
CO2: 30 mEq/L (ref 19–32)
Calcium: 9.8 mg/dL (ref 8.4–10.5)
Chloride: 99 mEq/L (ref 96–112)
Creatinine, Ser: 1 mg/dL (ref 0.40–1.50)
GFR: 84.07 mL/min (ref >60.00)
Glucose, Bld: 96 mg/dL (ref 70–99)
Potassium: 4.3 mEq/L (ref 3.5–5.1)
Sodium: 138 mEq/L (ref 135–145)
Total Bilirubin: 0.6 mg/dL (ref 0.2–1.2)
Total Protein: 6.9 g/dL (ref 6.0–8.3)

## 2021-02-14 LAB — HEMOGLOBIN A1C: Hgb A1c MFr Bld: 6.6 % — ABNORMAL HIGH (ref 4.6–6.5)

## 2021-02-14 LAB — C-REACTIVE PROTEIN: CRP: <1 mg/dL (ref 0.5–20.0)

## 2021-02-14 LAB — TSH: TSH: 2.18 u[IU]/mL (ref 0.35–4.50)

## 2021-02-14 LAB — CK: Total CK: 430 U/L — ABNORMAL HIGH (ref 7–232)

## 2021-02-14 LAB — POC URINALSYSI DIPSTICK (AUTOMATED)
Bilirubin, UA: NEGATIVE
Blood, UA: NEGATIVE
Glucose, UA: NEGATIVE
Ketones, UA: NEGATIVE
Leukocytes, UA: NEGATIVE
Protein, UA: NEGATIVE
Spec Grav, UA: 1.025 (ref 1.010–1.025)
Urobilinogen, UA: 0.2 E.U./dL
pH, UA: 6 (ref 5.0–8.0)

## 2021-02-14 LAB — URIC ACID: Uric Acid, Serum: 8.2 mg/dL — ABNORMAL HIGH (ref 4.0–7.8)

## 2021-02-14 LAB — CBC
HCT: 48.6 % (ref 39.0–52.0)
Hemoglobin: 16.6 g/dL (ref 13.0–17.0)
MCHC: 34.1 g/dL (ref 30.0–36.0)
MCV: 92.3 fl (ref 78.0–100.0)
Platelets: 262 10*3/uL (ref 150.0–400.0)
RBC: 5.27 Mil/uL (ref 4.22–5.81)
RDW: 14.2 % (ref 11.5–15.5)
WBC: 9.2 10*3/uL (ref 4.0–10.5)

## 2021-02-14 LAB — LIPID PANEL
Cholesterol: 243 mg/dL — ABNORMAL HIGH (ref 0–200)
HDL: 40.9 mg/dL (ref >39.00)
LDL Cholesterol: 164 mg/dL — ABNORMAL HIGH (ref 0–99)
NonHDL: 201.63
Total CHOL/HDL Ratio: 6
Triglycerides: 186 mg/dL — ABNORMAL HIGH (ref 0.0–149.0)
VLDL: 37.2 mg/dL (ref 0.0–40.0)

## 2021-02-14 LAB — VITAMIN D 25 HYDROXY (VIT D DEFICIENCY, FRACTURES): VITD: 19.34 ng/mL — ABNORMAL LOW (ref 30.00–100.00)

## 2021-02-14 LAB — SEDIMENTATION RATE: Sed Rate: 6 mm/hr (ref 0–20)

## 2021-02-14 LAB — PSA: PSA: 0.27 ng/mL (ref 0.10–4.00)

## 2021-02-14 LAB — T4, FREE: Free T4: 0.73 ng/dL (ref 0.60–1.60)

## 2021-02-14 LAB — VITAMIN B12: Vitamin B-12: 144 pg/mL — ABNORMAL LOW (ref 211–911)

## 2021-02-14 MED ORDER — OLMESARTAN MEDOXOMIL 20 MG PO TABS: 20.0000 mg | ORAL_TABLET | Freq: Every day | ORAL | 3 refills | Status: DC

## 2021-02-14 MED ORDER — BLOOD GLUCOSE MONITOR KIT: PACK | 0 refills | Status: DC

## 2021-02-14 NOTE — Assessment & Plan Note (Signed)
History of, repeat CK pending.

## 2021-02-14 NOTE — Assessment & Plan Note (Signed)
No follow-up since July 2021, repeat A1c pending. He is compliant to Metformin XR 500 mg.  Strongly advised weight loss through healthy diet and regular exercise. Declines all vaccines. Managed on ARB. Repeat lipid panel pending.  Consider holding Metformin to see if this helps with some of his aches, specifically abdominal cramping.

## 2021-02-14 NOTE — Assessment & Plan Note (Addendum)
Chronic, ongoing, generalized. He will be seeing his orthopedic provider soon for his ongoing hip pain since his fall.  Checking labs today including rheumatoid factor, CCP, sed rate, CRP, uric acid.  I do suspect that a 20+ pound weight loss would significantly improve his symptoms.

## 2021-02-14 NOTE — Assessment & Plan Note (Signed)
Declines all immunizations despite recommendations. PSA due, pending. Colonoscopy up-to-date, due in 2026.  Wrongly advised he work on a healthy diet and exercise for weight loss.  Exam today as noted. Labs pending

## 2021-02-14 NOTE — Assessment & Plan Note (Signed)
Chronic for years, negative biopsy of prostate [place].  Benign.  He declines all medication treatment for symptoms of BPH.  UA today negative. PSA pending

## 2021-02-14 NOTE — Assessment & Plan Note (Signed)
Chronic, ongoing. Labs pending.

## 2021-02-14 NOTE — Assessment & Plan Note (Signed)
Discussed the importance of a healthy diet and regular exercise in order for weight loss, and to reduce the risk of any potential medical problems.  Not managed on statin therapy, would likely benefit given his diabetes and heart disease risk.  Unfortunately he already struggles with myalgias and joint pain so this may not be an option.  Repeat lipid panel pending.

## 2021-02-14 NOTE — Assessment & Plan Note (Signed)
Underwent colonoscopy in 2021, due in 2026.

## 2021-02-14 NOTE — Assessment & Plan Note (Signed)
Improved on olmesartan 20 mg, continue same.  Continue spironolactone 25 mg.  CMP pending.

## 2021-02-14 NOTE — Assessment & Plan Note (Signed)
No recent cardiology visit, he plans on scheduling soon.

## 2021-02-14 NOTE — Assessment & Plan Note (Signed)
Epworth Sleepiness Scale score of 16 today. Referral placed to pulmonology for evaluation of sleep apnea

## 2021-02-14 NOTE — Assessment & Plan Note (Signed)
Repeat vitamin D level pending. 

## 2021-02-14 NOTE — Patient Instructions (Addendum)
Stop by the lab prior to leaving today. I will notify you of your results once received.   Dr. Rockey Situ or Fletcher Anon for cardiology in [place].  Start checking your blood sugar levels.  Appropriate times to check your blood sugar levels are:  -Before any meal (breakfast, lunch, dinner) -Two hours after any meal (breakfast, lunch, dinner) -Bedtime  Start exercising. You should be getting 150 minutes of moderate intensity exercise weekly.  It's important to improve your diet by reducing consumption of fast food, fried food, processed snack foods, sugary drinks. Increase consumption of fresh vegetables and fruits, whole grains, water.  Ensure you are drinking 64 ounces of water daily.  You will be contacted regarding your referral to pulmonology for the sleep study and cough.  Please let us know if you have not been contacted within two weeks.   Please schedule a follow up appointment in 6 months.   It was a pleasure to see you today!   Preventive Care 58-26 Years Old, Male Preventive care refers to lifestyle choices and visits with your health care provider that can promote health and wellness. This includes:  A yearly physical exam. This is also called an annual wellness visit.  Regular dental and eye exams.  Immunizations.  Screening for certain conditions.  Healthy lifestyle choices, such as: ? Eating a healthy diet. ? Getting regular exercise. ? Not using drugs or products that contain nicotine and tobacco. ? Limiting alcohol use. What can I expect for my preventive care visit? Physical exam Your health care provider will check your:  Height and weight. These may be used to calculate your BMI (body mass index). BMI is a measurement that tells if you are at a healthy weight.  Heart rate and blood pressure.  Body temperature.  Skin for abnormal spots. Counseling Your health care provider may ask you questions about your:  Past medical problems.  Family's medical  history.  Alcohol, tobacco, and drug use.  Emotional well-being.  Home life and relationship well-being.  Sexual activity.  Diet, exercise, and sleep habits.  Work and work Statistician.  Access to firearms. What immunizations do I need? Vaccines are usually given at various ages, according to a schedule. Your health care provider will recommend vaccines for you based on your age, medical history, and lifestyle or other factors, such as travel or where you work.   What tests do I need? Blood tests  Lipid and cholesterol levels. These may be checked every 5 years, or more often if you are over 4 years old.  Hepatitis C test.  Hepatitis B test. Screening  Lung cancer screening. You may have this screening every year starting at age 42 if you have a 30-pack-year history of smoking and currently smoke or have quit within the past 15 years.  Prostate cancer screening. Recommendations will vary depending on your family history and other risks.  Genital exam to check for testicular cancer or hernias.  Colorectal cancer screening. ? All adults should have this screening starting at age 56 and continuing until age 52. ? Your health care provider may recommend screening at age 53 if you are at increased risk. ? You will have tests every 1-10 years, depending on your results and the type of screening test.  Diabetes screening. ? This is done by checking your blood sugar (glucose) after you have not eaten for a while (fasting). ? You may have this done every 1-3 years.  STD (sexually transmitted disease) testing, if you are  at risk. Follow these instructions at home: Eating and drinking  Eat a diet that includes fresh fruits and vegetables, whole grains, lean protein, and low-fat dairy products.  Take vitamin and mineral supplements as recommended by your health care provider.  Do not drink alcohol if your health care provider tells you not to drink.  If you drink  alcohol: ? Limit how much you have to 0-2 drinks a day. ? Be aware of how much alcohol is in your drink. In the [place] one drink equals one 12 oz bottle of beer (355 mL), one 5 oz glass of wine (148 mL), or one 1 oz glass of hard liquor (44 mL).   Lifestyle  Take daily care of your teeth and gums. Brush your teeth every morning and night with fluoride toothpaste. Floss one time each day.  Stay active. Exercise for at least 30 minutes 5 or more days each week.  Do not use any products that contain nicotine or tobacco, such as cigarettes, e-cigarettes, and chewing tobacco. If you need help quitting, ask your health care provider.  Do not use drugs.  If you are sexually active, practice safe sex. Use a condom or other form of protection to prevent STIs (sexually transmitted infections).  If told by your health care provider, take low-dose aspirin daily starting at age 93.  Find healthy ways to cope with stress, such as: ? Meditation, yoga, or listening to music. ? Journaling. ? Talking to a trusted person. ? Spending time with friends and family. Safety  Always wear your seat belt while driving or riding in a vehicle.  Do not drive: ? If you have been drinking alcohol. Do not ride with someone who has been drinking. ? When you are tired or distracted. ? While texting.  Wear a helmet and other protective equipment during sports activities.  If you have firearms in your house, make sure you follow all gun safety procedures. What's next?  Go to your health care provider once a year for an annual wellness visit.  Ask your health care provider how often you should have your eyes and teeth checked.  Stay up to date on all vaccines. This information is not intended to replace advice given to you by your health care provider. Make sure you discuss any questions you have with your health care provider. Document Revised: 08/29/2019 Document Reviewed: 11/24/2018 Elsevier Patient  Education  2021 [employer].

## 2021-02-14 NOTE — Progress Notes (Signed)
Subjective:    Patient ID: Evan [name], male    DOB: 12/01/1966, 55 y.o.   MRN: [id number]  HPI  This visit occurred during the SARS-CoV-2 public health emergency.  Safety protocols were in place, including screening questions prior to the visit, additional usage of staff PPE, and extensive cleaning of exam room while observing appropriate contact time as indicated for disinfecting solutions.   Evan [name] is a 55 year old male who presents today for complete physical and to discuss several issues.   He would also like to discuss chronic fatigue, falls asleep very easily. He has noticed feeling better in the morning since taking Metformin. He snores at night, his wife sleeps in the guest bedroom. He has never undergone a sleep study. Epworth Sleepiness Score of 16 today.  He is waking up three times nightly to urinate, chronic for a few years. Sometimes difficulty initiating stream, some dribbling. He denies hematuria, but does notice a dark yellow color and foul smell. He underwent prostate biopsy several years ago which was benign. He was once prescribed a medication for his urinary symptoms, doesn't recall the name, wasn't sure if the medication helped as he didn't take it for long. Doesn't care to take medications.   Also with chronic, ongoing joint pain, generalized but more recently to the hips. Difficulty with balance, has fallen twice over the last several months. [place] off the edge of his patio around Fall 2021, fell onto his right hip and tore a tendon. About 6 weeks ago he was walking his dogs, hit a patch of snowy ice, hit his left head on the concrete. Since then he's noticed headaches to the parietal lobe of his head, was never evaluated for this. Feels unbalanced with walking up and down stairs, chronic before his fall. Recently experienced right lower extremity numbness to his right thigh while walking, this lasted a few hours. Following with orthopedics, plans on scheduling an  appointment.   Chronic history of myalgias with sporadic muscle cramping, history of elevated CK. A few weeks ago he experienced left sided abdominal cramping, felt like his muscles were in a knot.  His symptoms abated sporadically.  He continues to experience a chronic, dry cough. History of Covid-19 infection in January 2022, he believes his coughing symptoms may be residual from Covid. Currently managed on olmesartan 20 mg, omeprazole 20 mg.  Chronic cough resolved after lisinopril was removed and switched to losartan.  Unfortunately losartan is on national back order so he was switched to olmesartan.  History of cholecystectomy in 2021, continues to notice RUQ pain, worse with palpation and eating. Constant pain during the day which feels like pressure, occasionally sharp.  GI was uncertain of the etiology of his pain, especially after his cholecystectomy.    Immunizations: -Tetanus: Unsure, believes this may have had this last year.  -Influenza: Declines  -Shingles: Never completed -Covid-19: Declines   Diet: He endorses a poor diet.  Exercise: No regular exercise.  Eye exam: Due, will schedule Dental exam: Due, will schedule   Colonoscopy: Completed in 2021, due again in 2026 PSA: Due Hep C Screen:  Due  BP Readings from Last 3 Encounters:  02/14/21 124/72  11/06/20 (!) 165/93  06/14/20 132/86      Review of Systems  Constitutional: Positive for fatigue. Negative for unexpected weight change.  HENT: Negative for rhinorrhea.   Respiratory: Positive for cough. Negative for shortness of breath.   Cardiovascular: Negative for chest pain.  Gastrointestinal: Positive for  abdominal pain. Negative for constipation and diarrhea.  Genitourinary: Negative for difficulty urinating.       Nocturia   Musculoskeletal: Positive for arthralgias and myalgias.  Skin: Negative for rash.  Allergic/Immunologic: Negative for environmental allergies.  Neurological: Positive for dizziness.  Negative for numbness and headaches.  Psychiatric/Behavioral: Positive for sleep disturbance.       Past Medical History:  Diagnosis Date  . Arthritis   . Chronic kidney disease   . Colon polyps    benign per pt  . Diverticulitis   . Hyperlipidemia   . Hypertension   . Lyme disease   . Seizures ([place])      Social History   Socioeconomic History  . Marital status: Married    Spouse name: Not on file  . Number of children: 4  . Years of education: Not on file  . Highest education level: Not on file  Occupational History  . Occupation: Freight forwarder  Tobacco Use  . Smoking status: Never Smoker  . Smokeless tobacco: Never Used  Vaping Use  . Vaping Use: Never used  Substance and Sexual Activity  . Alcohol use: Yes    Comment: occasional  . Drug use: No  . Sexual activity: Not on file  Other Topics Concern  . Not on file  Social History Narrative  . Not on file   Social Determinants of Health   Financial Resource Strain: Not on file  Food Insecurity: Not on file  Transportation Needs: Not on file  Physical Activity: Not on file  Stress: Not on file  Social Connections: Not on file  Intimate Partner Violence: Not on file    Past Surgical History:  Procedure Laterality Date  . CHOLECYSTECTOMY  2021  . HERNIA REPAIR     x 2  . LEFT HEART CATH AND CORONARY ANGIOGRAPHY N/A 05/04/2018   Procedure: LEFT HEART CATH AND CORONARY ANGIOGRAPHY;  Surgeon: Leonie Man, MD;  Location: [facility];  Service: Cardiovascular;  Laterality: N/A;  . NECK SURGERY    . NEPHRECTOMY Left 2010   for atrophy and reflux  . PROSTATE BIOPSY    . SHOULDER SURGERY Left     Family History  Problem Relation Age of Onset  . Arthritis Mother   . Heart disease Mother   . Hypertension Mother   . Kidney disease Mother   . Colon cancer Father        dx in his early 44's  . Lung cancer Father   . Prostate cancer Father   . Breast cancer Father        69 or 17  . Lung cancer  Brother        smoker  . Lung cancer Paternal Aunt   . Colon cancer Paternal Uncle   . Diabetes Paternal Uncle   . Arthritis Maternal Grandmother   . Colon cancer Maternal Grandmother   . Heart disease Maternal Grandmother   . Heart disease Maternal Grandfather   . Diabetes Paternal Grandfather   . Lung cancer Brother        smoker  . Bladder Cancer Brother     Allergies  Allergen Reactions  . Nuvigil [Armodafinil] Other (See Comments)    Seizures   . Seroquel [Quetiapine] Other (See Comments)    Seizures.  . Simvastatin Hives    Current Outpatient Medications on File Prior to Visit  Medication Sig Dispense Refill  . betamethasone dipropionate (DIPROLENE) 0.05 % cream Apply 1 application topically daily.  3  . Cholecalciferol (VITAMIN D3) 5000 units CAPS Take 1 capsule by mouth as directed.     . dicyclomine (BENTYL) 20 MG tablet 1 tablet    . diphenhydrAMINE (BENADRYL) 25 MG tablet Take 25 mg by mouth daily.    . fluticasone (FLONASE) 50 MCG/ACT nasal spray Place 1 spray into both nostrils 2 (two) times daily. 16 g 0  . metFORMIN (GLUCOPHAGE-XR) 500 MG 24 hr tablet TAKE 1 TABLET (500 MG TOTAL) BY MOUTH DAILY WITH BREAKFAST. FOR DIABETES. 90 tablet 0  . mupirocin ointment (BACTROBAN) 2 % Apply 1 application topically daily.    Marland Kitchen omeprazole (PRILOSEC) 20 MG capsule Take 1 capsule (20 mg total) by mouth daily. 60 capsule 3  . spironolactone (ALDACTONE) 25 MG tablet TAKE 1 TABLET BY MOUTH EVERY DAY 90 tablet 0  . Vitamin D, Ergocalciferol, (DRISDOL) 1.25 MG (50000 UNIT) CAPS capsule 1 capsule     No current facility-administered medications on file prior to visit.    BP 124/72   Pulse 91   Temp 97.8 F (36.6 C) (Temporal)   Ht 5\' 10"  (1.778 m)   Wt 297 lb 4 oz (134.8 kg)   SpO2 96%   BMI 42.65 kg/m    Objective:   Physical Exam Constitutional:      Appearance: He is well-nourished.  HENT:     Right Ear: Tympanic membrane and ear canal normal.     Left Ear:  Tympanic membrane and ear canal normal.     Mouth/Throat:     Mouth: Oropharynx is clear and moist.  Eyes:     Extraocular Movements: EOM normal.     Pupils: Pupils are equal, round, and reactive to light.  Cardiovascular:     Rate and Rhythm: Normal rate and regular rhythm.  Pulmonary:     Effort: Pulmonary effort is normal.     Breath sounds: Normal breath sounds.  Abdominal:     General: Bowel sounds are normal.     Palpations: Abdomen is soft.     Tenderness: There is no abdominal tenderness.  Musculoskeletal:        General: Normal range of motion.     Cervical back: Neck supple.     Comments: Ambulatory in office without assistive device. Up and down from exam table without difficulty.  Skin:    General: Skin is warm and dry.  Neurological:     Mental Status: He is alert and oriented to person, place, and time.     Cranial Nerves: No cranial nerve deficit.     Deep Tendon Reflexes:     Reflex Scores:      Patellar reflexes are 2+ on the right side and 2+ on the left side. Psychiatric:        Mood and Affect: Mood and affect and mood normal.            Assessment & Plan:

## 2021-02-14 NOTE — Assessment & Plan Note (Signed)
Repeat vitamin B12 level pending. 

## 2021-02-14 NOTE — Assessment & Plan Note (Signed)
Chronic, ongoing.  Differentials include undiagnosed sleep apnea, type 2 diabetes, obesity, deconditioning, other metabolic cause.  Labs pending.  Referral placed for sleep study.

## 2021-02-14 NOTE — Assessment & Plan Note (Signed)
Resolved after removing ACE inhibitor, but has returned since Covid infection in January 2022.  Compliant PPI. Managed on ARB so there could be some slight chance that this is contributing.  He denies a childhood history of asthma, never a smoker.  We will be sending to pulmonology to evaluate for sleep apnea, will also have him discuss chronic cough.

## 2021-02-17 LAB — ANA: Anti Nuclear Antibody (ANA): NEGATIVE

## 2021-02-17 LAB — CYCLIC CITRUL PEPTIDE ANTIBODY, IGG: Cyclic Citrullin Peptide Ab: <16 UNITS

## 2021-02-17 LAB — RHEUMATOID FACTOR: Rheumatoid fact SerPl-aCnc: <14 IU/mL (ref <14)

## 2021-02-21 ENCOUNTER — Other Ambulatory Visit: Payer: Self-pay | Admitting: Cardiology

## 2021-02-22 DIAGNOSIS — I10 Essential (primary) hypertension: Secondary | ICD-10-CM

## 2021-02-27 MED ORDER — SPIRONOLACTONE 25 MG PO TABS: 25.0000 mg | ORAL_TABLET | Freq: Every day | ORAL | 3 refills | Status: DC

## 2021-03-10 ENCOUNTER — Other Ambulatory Visit: Payer: Self-pay | Admitting: Primary Care

## 2021-03-10 DIAGNOSIS — E119 Type 2 diabetes mellitus without complications: Secondary | ICD-10-CM

## 2021-03-10 NOTE — Telephone Encounter (Signed)
During his visit in early March 2022 we discussed to hold his metformin to see if this helped to reduce abdominal cramping.  1. Did he do this? 2. Did this help to reduce abdominal cramping?

## 2021-03-11 NOTE — Telephone Encounter (Signed)
Left message to return call to our office.  

## 2021-03-12 DIAGNOSIS — E119 Type 2 diabetes mellitus without complications: Secondary | ICD-10-CM

## 2021-03-12 MED ORDER — METFORMIN HCL ER 500 MG PO TB24: 500.0000 mg | ORAL_TABLET | Freq: Every day | ORAL | 0 refills | Status: DC

## 2021-03-12 NOTE — Telephone Encounter (Signed)
Called patient and schedule lab appointment on 03/21/21 at 8:30 am. Refill sent to pharmacy.

## 2021-03-12 NOTE — Telephone Encounter (Signed)
Noted  

## 2021-03-13 ENCOUNTER — Telehealth: Payer: Self-pay

## 2021-03-13 ENCOUNTER — Other Ambulatory Visit: Payer: Self-pay | Admitting: Internal Medicine

## 2021-03-13 NOTE — Telephone Encounter (Signed)
Patient states last appointment was CPE but was sent bill. Did not think he had charged. Let him know that I would be sending message to you for your help.

## 2021-03-13 NOTE — Telephone Encounter (Signed)
Called patient someone refilled yesterday with out reading message. He states he has never held meds and has continued on it. Did have billing question that I will send in sperate message to [place].

## 2021-03-21 ENCOUNTER — Other Ambulatory Visit: Payer: Self-pay

## 2021-03-21 ENCOUNTER — Other Ambulatory Visit: Payer: Self-pay | Admitting: Primary Care

## 2021-03-21 ENCOUNTER — Other Ambulatory Visit (INDEPENDENT_AMBULATORY_CARE_PROVIDER_SITE_OTHER): Payer: No Typology Code available for payment source

## 2021-03-21 DIAGNOSIS — E790 Hyperuricemia without signs of inflammatory arthritis and tophaceous disease: Secondary | ICD-10-CM | POA: Diagnosis not present

## 2021-03-21 LAB — URIC ACID: Uric Acid, Serum: 7.5 mg/dL (ref 4.0–7.8)

## 2021-03-27 NOTE — Telephone Encounter (Signed)
Attempted to reach patient to discuss. Lvm asking him to call office. As we do not handle billing in our office, he will need to be directed to the billing department to have them look into it.

## 2021-04-10 ENCOUNTER — Other Ambulatory Visit: Payer: Self-pay

## 2021-04-10 ENCOUNTER — Encounter: Payer: Self-pay | Admitting: Internal Medicine

## 2021-04-10 ENCOUNTER — Ambulatory Visit (INDEPENDENT_AMBULATORY_CARE_PROVIDER_SITE_OTHER): Payer: No Typology Code available for payment source | Admitting: Internal Medicine

## 2021-04-10 VITALS — BP 126/90 | HR 86 | Temp 97.3°F | Ht 70.0 in | Wt 305.0 lb

## 2021-04-10 DIAGNOSIS — R071 Chest pain on breathing: Secondary | ICD-10-CM | POA: Diagnosis not present

## 2021-04-10 DIAGNOSIS — R400 Somnolence: Secondary | ICD-10-CM | POA: Diagnosis not present

## 2021-04-10 NOTE — Progress Notes (Signed)
Name: Evan [name] MRN: [id number] DOB: [birth year]     CONSULTATION DATE: 04/10/2021 REFERRING MD : Carlis Abbott  STUDIES:  11/06/20 CXR independently reviewed by Me today  ECHO 9323 Grade 1 Diastolic dysfunction    CHIEF COMPLAINT: daytime sleepiness    HISTORY OF PRESENT ILLNESS:  Patient is seen today for problems and issues with sleep related to excessive daytime sleepiness Patient  has been having sleep problems for many years Patient has been having excessive daytime sleepiness for a long time Patient has been having extreme fatigue and tiredness, lack of energy +  very Loud snoring every night + struggling breathe at night and gasps for air   Discussed sleep data and reviewed with patient.  Encouraged proper weight management.  Discussed driving precautions and its relationship with hypersomnolence.  Discussed operating dangerous equipment and its relationship with hypersomnolence.  Discussed sleep hygiene, and benefits of a fixed sleep waked time.  The importance of getting eight or more hours of sleep discussed with patient.  Discussed limiting the use of the computer and television before bedtime.  Decrease naps during the day, so night time sleep will become enhanced.  Limit caffeine, and sleep deprivation.  HTN, stroke, and heart failure are potential risk factors.    EPWORTH SLEEP SCORE 18   Patient also has h/o chest pain, on/off for last several weeks No SOB or DOE Has seen cardiology in the past  patient has gained 50-80 pounds in last 2 years   PAST MEDICAL HISTORY :   has a past medical history of Arthritis, Chronic kidney disease, Colon polyps, Diverticulitis, Hyperlipidemia, Hypertension, Lyme disease, and Seizures ([place]).  has a past surgical history that includes Nephrectomy (Left, 2010); Neck surgery; Shoulder surgery (Left); Hernia repair; LEFT HEART CATH AND CORONARY ANGIOGRAPHY (N/A, 05/04/2018); Prostate biopsy; and Cholecystectomy  (2021). Prior to Admission medications   Medication Sig Start Date End Date Taking? Authorizing Provider  betamethasone dipropionate (DIPROLENE) 0.05 % cream Apply 1 application topically daily.  02/16/18   [provider]  blood glucose meter kit and supplies KIT Dispense based on patient and insurance preference. Use up to four times daily as directed. (FOR ICD-9 250.00, 250.01). 02/14/21   [name], NP  Cholecalciferol (VITAMIN D3) 5000 units CAPS Take 1 capsule by mouth as directed.     [provider]  dicyclomine (BENTYL) 20 MG tablet 1 tablet 09/10/20   [provider]  diphenhydrAMINE (BENADRYL) 25 MG tablet Take 25 mg by mouth daily.    [provider]  fluticasone (FLONASE) 50 MCG/ACT nasal spray Place 1 spray into both nostrils 2 (two) times daily. 01/12/20   [name], NP  metFORMIN (GLUCOPHAGE-XR) 500 MG 24 hr tablet TAKE 1 TABLET (500 MG TOTAL) BY MOUTH DAILY WITH BREAKFAST. FOR DIABETES. 03/17/21   Lesleigh Noe, MD  metFORMIN (GLUCOPHAGE-XR) 500 MG 24 hr tablet Take 1 tablet (500 mg total) by mouth daily with breakfast. For diabetes. 03/12/21   [name], NP  mupirocin ointment (BACTROBAN) 2 % Apply 1 application topically daily. 06/07/20   [provider]  olmesartan (BENICAR) 20 MG tablet Take 1 tablet (20 mg total) by mouth daily. For blood pressure. 02/14/21   [name], NP  omeprazole (PRILOSEC) 20 MG capsule Take 1 capsule (20 mg total) by mouth daily. 05/04/18   Danford, Suann Larry, MD  spironolactone (ALDACTONE) 25 MG tablet Take 1 tablet (25 mg total) by mouth daily. For blood pressure. 02/27/21  [name], NP  Vitamin D, Ergocalciferol, (DRISDOL) 1.25 MG (50000 UNIT) CAPS capsule 1 capsule    [provider]   Allergies  Allergen Reactions  . Nuvigil [Armodafinil] Other (See Comments)    Seizures   . Seroquel [Quetiapine] Other (See Comments)    Seizures.  . Simvastatin Hives     FAMILY HISTORY:  family history includes Arthritis in his maternal grandmother and mother; Bladder Cancer in his brother; Breast cancer in his father; Colon cancer in his father, maternal grandmother, and paternal uncle; Diabetes in his paternal grandfather and paternal uncle; Heart disease in his maternal grandfather, maternal grandmother, and mother; Hypertension in his mother; Kidney disease in his mother; Lung cancer in his brother, brother, father, and paternal aunt; Prostate cancer in his father. SOCIAL HISTORY:  reports that he has never smoked. He has never used smokeless tobacco. He reports current alcohol use. He reports that he does not use drugs.  REVIEW OF SYSTEMS:   Constitutional: Negative for fever, chills, weight loss, malaise/fatigue and diaphoresis.  HENT: Negative for hearing loss, ear pain, nosebleeds, congestion, sore throat, neck pain, tinnitus and ear discharge.   Eyes: Negative for blurred vision, double vision, photophobia, pain, discharge and redness.  Respiratory: Negative for cough, hemoptysis, sputum production, shortness of breath, wheezing and stridor.   Cardiovascular: Negative for chest pain, palpitations, orthopnea, claudication, leg swelling and PND.  Gastrointestinal: Negative for heartburn, nausea, vomiting, abdominal pain, diarrhea, constipation, blood in stool and melena.  Genitourinary: Negative for dysuria, urgency, frequency, hematuria and flank pain.  Musculoskeletal: Negative for myalgias, back pain, joint pain and falls.  Skin: Negative for itching and rash.  Neurological: Negative for dizziness, tingling, tremors, sensory change, speech change, focal weakness, seizures, loss of consciousness, weakness and headaches.  Endo/Heme/Allergies: Negative for environmental allergies and polydipsia. Does not bruise/bleed easily.  ALL OTHER ROS ARE NEGATIVE    BP 126/90 (BP Location: Left Arm, Patient Position: Sitting, Cuff Size: Normal)   Pulse 86    Temp (!) 97.3 F (36.3 C) (Temporal)   Ht $R'5\' 10"'mo$  (1.778 m)   Wt (!) 305 lb (138.3 kg)   SpO2 94%   BMI 43.76 kg/m    Physical Examination:   General Appearance: No distress  Neuro:without focal findings,  speech normal,  HEENT: PERRLA, EOM intact.   Pulmonary: normal breath sounds, No wheezing.  CardiovascularNormal S1,S2.  No m/r/g.   Abdomen: Benign, Soft, non-tender. Renal:  No costovertebral tenderness  GU:  Not performed at this time. Endoc: No evident thyromegaly, no signs of acromegaly. Skin:   warm, no rashes, no ecchymosis  Extremities: normal, no cyanosis, clubbing. NEUROLOGIC: Cranial nerves II through XII are intact. No gross focal neurological deficits.  PSYCHIATRIC: Mood, affect within normal limits.    ASSESSMENT AND PLAN SYNOPSIS  55 yo obese white male with signs and symptoms of obstructive sleep apnea with intermittent angina  Obtain Home Sleep Study to assess for sleep apnea  Cardiology referral for chest pain  Obesity -recommend significant weight loss -recommend changing diet  Deconditioned state -Recommend increased daily activity and exercise    COVID-19 EDUCATION: The signs and symptoms of COVID-19 were discussed with the patient. Importance of hand Hygiene and Social Distancing   MEDICATION ADJUSTMENTS/LABS AND TESTS ORDERED:  CURRENT MEDICATIONS REVIEWED AT LENGTH WITH PATIENT TODAY   Patient  satisfied with Plan of action and management. All questions answered  Follow up  6 months  Total time Spent 35 mins  Corrin Parker,  M.D.  [facility]  Medical Director [place] Director [facility] Cardio-Pulmonary Department

## 2021-04-10 NOTE — Patient Instructions (Addendum)
Obtain Home Sleep Study to assess for sleep apnea  Cardiology referral for chest pain

## 2021-04-11 ENCOUNTER — Telehealth: Payer: Self-pay | Admitting: Cardiology

## 2021-04-11 NOTE — Telephone Encounter (Signed)
Referral placed for patient to be seen in [place].  Established [place] but per patient and previous not declined to see again.     Is it ok with you to change providers to [place] office per request?

## 2021-04-11 NOTE — Telephone Encounter (Signed)
OK with me for switch to [facility], MD

## 2021-04-17 ENCOUNTER — Ambulatory Visit: Payer: No Typology Code available for payment source | Admitting: Physician Assistant

## 2021-04-17 ENCOUNTER — Encounter: Payer: Self-pay | Admitting: Physician Assistant

## 2021-04-17 VITALS — BP 110/82 | HR 78 | Ht 70.0 in | Wt 300.0 lb

## 2021-04-17 DIAGNOSIS — I10 Essential (primary) hypertension: Secondary | ICD-10-CM | POA: Diagnosis not present

## 2021-04-17 DIAGNOSIS — E119 Type 2 diabetes mellitus without complications: Secondary | ICD-10-CM

## 2021-04-17 DIAGNOSIS — R42 Dizziness and giddiness: Secondary | ICD-10-CM | POA: Diagnosis not present

## 2021-04-17 DIAGNOSIS — R072 Precordial pain: Secondary | ICD-10-CM

## 2021-04-17 DIAGNOSIS — K219 Gastro-esophageal reflux disease without esophagitis: Secondary | ICD-10-CM

## 2021-04-17 DIAGNOSIS — R0609 Other forms of dyspnea: Secondary | ICD-10-CM

## 2021-04-17 DIAGNOSIS — R0600 Dyspnea, unspecified: Secondary | ICD-10-CM | POA: Diagnosis not present

## 2021-04-17 DIAGNOSIS — R079 Chest pain, unspecified: Secondary | ICD-10-CM

## 2021-04-17 DIAGNOSIS — I5032 Chronic diastolic (congestive) heart failure: Secondary | ICD-10-CM

## 2021-04-17 DIAGNOSIS — R0602 Shortness of breath: Secondary | ICD-10-CM

## 2021-04-17 DIAGNOSIS — R400 Somnolence: Secondary | ICD-10-CM

## 2021-04-17 DIAGNOSIS — E785 Hyperlipidemia, unspecified: Secondary | ICD-10-CM

## 2021-04-17 NOTE — Progress Notes (Signed)
Office Visit    Patient Name: Evan [name] Date of Encounter: 04/18/2021  PCP:  [name], NP   [place]  Cardiologist:  Candee Furbish, MD  Advanced Practice Provider:  No care team member to display Electrophysiologist:  None   Chief Complaint    Chief Complaint  Patient presents with  . OTHER    Patient c/o chest pain in upper left side of chest and had an episode last week, it felt like someone was squeezing his chest and he was going to black out be he didn't. Medications verbally reviewed with patient.     55 y.o. male with history of chest pain with LHC performed 2019 showing normal cors, concentric hypertrophy/normal EF by 2019 cath, hypertension, hyperlipidemia, family history of premature CAD, obesity, GERD, esophagitis, solitary kidney s/p left nephrectomy in 2010, intolerance to nitrates/Imdur due to headache, intolerance to ACE inhibitors due to cough, and here today to establish with the [facility] office and given recent chest pain in the upper left side of his chest.  Past Medical History    Past Medical History:  Diagnosis Date  . Arthritis   . Chronic kidney disease   . Colon polyps    benign per pt  . Diverticulitis   . Hyperlipidemia   . Hypertension   . Lyme disease   . Seizures ([place])    Past Surgical History:  Procedure Laterality Date  . CHOLECYSTECTOMY  2021  . HERNIA REPAIR     x 2  . LEFT HEART CATH AND CORONARY ANGIOGRAPHY N/A 05/04/2018   Procedure: LEFT HEART CATH AND CORONARY ANGIOGRAPHY;  Surgeon: Leonie Man, MD;  Location: [place] [facility];  Service: Cardiovascular;  Laterality: N/A;  . NECK SURGERY    . NEPHRECTOMY Left 2010   for atrophy and reflux  . PROSTATE BIOPSY    . SHOULDER SURGERY Left     Allergies  Allergies  Allergen Reactions  . Nuvigil [Armodafinil] Other (See Comments)    Seizures   . Seroquel [Quetiapine] Other (See Comments)    Seizures.  . Simvastatin Hives     History of Present Illness    Evan [name] is a 55 y.o. male with PMH as above.  He has history of hypertension, hyperlipidemia, family history of premature CAD, obesity, GERD, esophagitis, solitary kidney s/p left nephrectomy in 2010, and is being seen today to establish with [facility].  He reports a strong family history of CA and CAD. Specifically, he reports a mom that had triple bypass at age 11 and carotid artery dz. He has history of nephrectomy, hypertension, hospitalization for accelerating chest pain with cardiac catheterization showing no significant CAD, normal LVEDP, and here today for follow-up.   05/04/2018 echo performed for chest pain with mild concentric hypertrophy, EF 60 to 65%, NR WMA, G1 DD, trivial TR.   05/04/2018 LHC performed for symptoms worrisome for unstable angina with normal coronaries, LVEDP normal, EF greater than 65%, LVEDP normal.  Recommendation was to consider nonanginal chest pain.  11/2019 lower extremity Dopplers, performed for claudication-like symptoms from knees to feet after walking x1 year performed.  He reported feeling worse at night and that he had recently been experiencing the pain for the last 3 months with heaviness in both legs, as well as swelling.  Study showed no evidence of right or left lower extremity arterial disease.  Today, 04/17/2021, he returns to clinic at [facility] and wishes to establish with [facility].  He was recently referred to our office after seeing Dr. Mortimer Fries of pulmonology for daytime sleepiness / signs and sx of OSA with recommendation for sleep study at home. He was referred to [place] for report of chest pain that was ongoing for at least 1 week per note.   He reports feeling poorly for the past few weeks.  He reports CP approximately 1 week ago and right before his pulmonologist appointment (a couple of day prior to his visit with the pulmonologist). He reports feeling poorly before that time /  leading up until that time. Then, he felt an initial burst or episode of intense CP that was a "split second" and described as a heart squeeze, which was so intense that he felt as if he was going to blackout but did not actually blackout or lose consciousness.  He then reports experiencing chest pain that waxed and waned from there on when sitting at his desk.  This persisted throughout the rest of the day and has been ongoing and waxing and waning since then.  Chest pain was further described as a twinge or clench of his chest. He also reports DOE or SOB with exertion.  He reports recently feeling "funky" in his lower jaw.  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  He notes abdominal distention, though he does note also recent gallbladder removal.   He is able to lie flat in the bed. At times, he reports feeling dizzy or lightheaded, reporting the room will start spinning. No tachypalpitations. No syncope or falls. No s/sx of bleeding. He reports recent night sweats. He has recently had elevated CK, followed by and with workup per PCP. He has a a bump on his neck, which he attributes to spine issues, but for which he is going to follow-up with his PCP. He does report significant stressors, as his wife has leukemia. He reports his hope for several medication changes moving forward. For example, he is hopeful to get off of metformin due to its influence on his kidneys and pt reported research regarding its potential carcinogens.  He is hopeful to discontinue spironolactone, if possible, due to his 1 kodney. He did not tolerate lisinopril due to cough. He is hesitant regarding many medications that are processed through the kidneys due to his nephrectomy, which is understandable. He has a history of migraines and does not feel he would tolerate Imdur, especially given he does get HA with SL nitro. On review of EMR, wt gain of 50-80lbs in last 2 years. Also noted  is daytime fatigue with pulmonology evaluating for s/sx of OSA. Per pulmonology note, recommendation has been to obtain sleep study to assess for sleep apnea. He is monitoring his BP at home with SBP 120-130 and DBP 78-84. HR 70s. He does feel as if he is holding on to some fluid. Diet and lifestyle reviewed.   Home Medications   Current Outpatient Medications  Medication Instructions  . betamethasone dipropionate (DIPROLENE) 8.75 % cream 1 application, Topical, Daily  . blood glucose meter kit and supplies KIT Dispense based on patient and insurance preference. Use up to four times daily as directed. (FOR ICD-9 250.00, 250.01).  . Cholecalciferol (VITAMIN D3) 5000 units CAPS 1 capsule, Oral, As directed  . diphenhydrAMINE (BENADRYL) 25 mg, Oral, Daily  . metFORMIN (GLUCOPHAGE-XR) 500 mg, Oral, Daily with breakfast, For diabetes.  . mupirocin ointment (BACTROBAN) 2 % 1 application, Topical,  Daily  . olmesartan (BENICAR) 20 mg, Oral, Daily, For blood pressure.  Marland Kitchen omeprazole (PRILOSEC) 20 mg, Oral, Daily  . spironolactone (ALDACTONE) 25 mg, Oral, Daily, For blood pressure.     Review of Systems    As detailed in the HPI. He reports CP, dizziness, fatigue, s/sx of OSA, abdominal distention, LEE, SOB.DOE.  All other systems reviewed and are otherwise negative except as noted above.  Physical Exam    VS:  BP 110/82 (BP Location: Left Arm, Patient Position: Sitting, Cuff Size: Large)   Pulse 78   Ht _0  (1.778 m)   Wt 300 lb (136.1 kg)   SpO2 93%   BMI 43.05 kg/m  , BMI Body mass index is 43.05 kg/m. GEN: Well nourished, well developed, in no acute distress. HEENT: normal. Neck: Supple, no carotid bruits, or masses.  Knee difficult to assess due to body habitus Cardiac: RRR, no murmurs, rubs, or gallops. No clubbing, cyanosis.  1+ bilateral lower extremity edema.  Radials/DP/PT 2+ and equal bilaterally.  Respiratory:  Respirations regular and unlabored, distant breath sounds but  otherwise clear to auscultation bilaterally. GI: Slightly firm, distended, nontender, BS + x 4. MS: no deformity or atrophy. Skin: warm and dry, no rash. Neuro:  Strength and sensation are intact. Psych: Normal affect.  Accessory Clinical Findings    ECG personally reviewed by me today - NSR, 78 bpm, LAD, poor R wave as seen in prior ekgs  - no acute changes.  VITALS Reviewed today   Temp Readings from Last 3 Encounters:  04/10/21 (!) 97.3 F (36.3 C) (Temporal)  02/14/21 97.8 F (36.6 C) (Temporal)  11/06/20 97.9 F (36.6 C) (Oral)   BP Readings from Last 3 Encounters:  04/17/21 110/82  04/10/21 126/90  02/14/21 124/72   Pulse Readings from Last 3 Encounters:  04/17/21 78  04/10/21 86  02/14/21 91    Wt Readings from Last 3 Encounters:  04/17/21 300 lb (136.1 kg)  04/10/21 (!) 305 lb (138.3 kg)  02/14/21 297 lb 4 oz (134.8 kg)     LABS  reviewed today   Lab Results  Component Value Date   WBC 9.2 02/14/2021   HGB 16.6 02/14/2021   HCT 48.6 02/14/2021   MCV 92.3 02/14/2021   PLT 262.0 02/14/2021   Lab Results  Component Value Date   CREATININE 1.00 02/14/2021   BUN 16 02/14/2021   NA 138 02/14/2021   K 4.3 02/14/2021   CL 99 02/14/2021   CO2 30 02/14/2021   Lab Results  Component Value Date   ALT 32 02/14/2021   AST 26 02/14/2021   ALKPHOS 45 02/14/2021   BILITOT 0.6 02/14/2021   Lab Results  Component Value Date   CHOL 243 (H) 02/14/2021   HDL 40.90 02/14/2021   LDLCALC 164 (H) 02/14/2021   LDLDIRECT 173.0 04/14/2017   TRIG 186.0 (H) 02/14/2021   CHOLHDL 6 02/14/2021    Lab Results  Component Value Date   HGBA1C 6.6 (H) 02/14/2021   Lab Results  Component Value Date   TSH 2.18 02/14/2021     STUDIES/PROCEDURES reviewed today   US Arterial study 11/2019 Summary:  Right: Resting right ankle-brachial index is within normal range. No  evidence of significant right lower extremity arterial disease. The right  toe-brachial index is  normal.  Left: Resting left ankle-brachial index is within normal range. No  evidence of significant left lower extremity arterial disease. The left  toe-brachial index is normal.   LHC  04/2018  Angiographically normal coronary arteries.  The left ventricular systolic function is normal -- The left ventricular ejection fraction is greater than 65% by visual estimate.  LV end diastolic pressure is normal. Non-anginal Chest Pain. Plan:   TR Band removal in PACU Holding Area - d/c home after bedrest  Echo 04/2018 - Left ventricle: The cavity size was normal. There was mild  concentric hypertrophy. Systolic function was normal. The  estimated ejection fraction was in the range of 60% to 65%. Wall  motion was normal; there were no regional wall motion  abnormalities. Doppler parameters are consistent with abnormal  left ventricular relaxation (grade 1 diastolic dysfunction).  There was no evidence of elevated ventricular filling pressure by  Doppler parameters.  - Aortic valve: There was no regurgitation.  - Right ventricle: Systolic function was normal.  - Right atrium: The atrium was normal in size.  - Tricuspid valve: There was trivial regurgitation.  - Pulmonary arteries: Systolic pressure was within the normal  range.  - Inferior vena cava: The vessel was normal in size.  - Pericardium, extracardiac: There was no pericardial effusion.   Assessment & Plan    Chest pain -- No current chest pain, but reports at least a 1 week history of waxing and waning chest pain.  Chest pain initially began as a sharp squeeze of his chest that then persisted with waxing and waning chest pain from that time forward.  Other reported symptoms as above.  He reports several stressors, which she states could be contributing to his discomfort.  He is also currently undergoing work-up for sleep apnea by pulmonology.  EKG today without acute ST/T changes, which is reassuring.  Previous  2019 echo and catheterization as above.  2019 echo showed EF 60 to 65%, concentric hypertrophy, G1 DD.  2019 cath showed normal cors.  Risk factors for CP 2/2 coronary insufficiency include family history, DM2, hyperlipidemia, hypertension, obesity, male, and age greater than 78.  Given his most recent symptoms, further work-up with echo recommended +/- MPI/cCTA or cath if reduced EF or WMA. Echo will also allow [place] to reassess valvular function, heart pressures, and r/o acute structural abnormalities.  Echo will also allow [place] to assess for right heart strain.  BP/HR well controlled with ongoing control recommended and risk factor modification discussed.  We reviewed recommendations for activity and heart healthy diet. Agree with work-up for sleep apnea as detailed above and in pulmonology note.  Of note, not currently on ASA, BB, or statin. Noted is that pt does not tolerate simvastatin due to hives. Discussed Imdur for antianginal effect; however, given due to patient report of migraines and poor tolerance of sublingual nitro, will defer. Discussed could also consider addition of BB/CCB for antianginal effect at RTC; however, and as per pt preference, will defer for now. Further recommendations at RTC.   Diastolic dysfunction -- Reports signs and symptoms of volume overload.  Consider that at least some element of dyspnea/shortness of breath could be due to deconditioning, as discussed with pulmonology.  Patient does appear slightly volume up on exam with consideration of body habitus.  BP today with DBP elevated but otherwise SBP well controlled.  Given his solitary kidney, recommendation is to obtain echo to reassess heart pressures.  Per patient preference, will defer medication changes or any addition of loop diuretic. Continue olmesartan and spironolactone for now. Continue home daily weights and BP checks.  Low-salt diet and fluid restrictions reviewed.  Given most recent chest pain  and symptoms as above,  will obtain echo with further recommendations at that time.    Hypertension, goal BP <130/80 --DBP borderline. Continue current olmesartan and spironolactone. As above, reassess at RTC. Continue home BP and wt monitoring. Discussed salt under 2g/day and fluid under 2L /day. Reassess addition of BB/CCB or loop at RTC and s/p echo as above.  Dizziness -- Given report of dizziness and chest pain/symptoms as above, reasonable to rule out arrhythmia with Zio monitor.  Recommend Zio monitor after obtain echo.  Ordered Zio XT x2 weeks to exclude chest pain, DOE/SOB, and sx as above as 2/2 arrhythmia.   HLD --LDL 164 with Tg 186 and total cholesterol 243. He does not tolerate simvastatin per pt allergies. Pending echo and further workup, could consider Crestor / Chauncey Cruel /[id number] / bempedoic acid / Vascepa / Incliseran for risk factor modification. Reassess at RTC. No CAC by cath or LE vascular dz which is reassuring and as above.  DM2 -- Most recent hemoglobin A1c 6.6.  Ongoing glycemic control recommended for risk factor modification.  GERD --Continue PPI. No reported s/sx of bleeding.   Daytime somnolence / OSA sx ---Current workup for OSA per Pulmonology. Continue. Tx of OSA recommended from a cardiovascular standpoint if needed.   Medication changes: None. Deferred at this time Labs ordered: None Studies / Imaging ordered: Echo, Zio Future considerations: Loop, BB, or CCB if indicated  Disposition: RTC after echo, Zio  *Please be aware that the above documentation was completed voice recognition software and may contain dictation errors.       Arvil Chaco, PA-C

## 2021-04-17 NOTE — Patient Instructions (Signed)
Medication Instructions:  - Your physician recommends that you continue on your current medications as directed. Please refer to the Current Medication list given to you today.  *If you need a refill on your cardiac medications before your next appointment, please call your pharmacy*   Lab Work: - none ordered  If you have labs (blood work) drawn today and your tests are completely normal, you will receive your results only by: Marland Kitchen MyChart Message (if you have MyChart) OR . A paper copy in the mail If you have any lab test that is abnormal or we need to change your treatment, we will call you to review the results.   Testing/Procedures:  1) Echocardiogram (Per Jacquelyn- needs to be done 1st)  - Your physician has requested that you have an echocardiogram. Echocardiography is a painless test that uses sound waves to create images of your heart. It provides your doctor with information about the size and shape of your heart and how well your heart's chambers and valves are working. This procedure takes approximately one hour. There are no restrictions for this procedure. There is a possibility that an IV may need to be started during your test to inject an image enhancing agent. This is done to obtain more optimal pictures of your heart. Therefore we ask that you do at least drink some water prior to coming in to hydrate your veins.    2) Your physician has recommended that you wear a Zio XT (heart) monitor- for 14 days (can be placed in the office same day, after the echo is completed)  This monitor is a medical device that records the heart's electrical activity. Doctors most often use these monitors to diagnose arrhythmias. Arrhythmias are problems with the speed or rhythm of the heartbeat. The monitor is a small device applied to your chest. You can wear one while you do your normal daily activities. While wearing this monitor if you have any symptoms to push the button and record what you  felt. Once you have worn this monitor for the period of time provider prescribed (Usually 14 days), you will return the monitor device in the postage paid box. Once it is returned they will download the data collected and provide [place] with a report which the provider will then review and we will call you with those results. Important tips:  1. Avoid showering during the first 24 hours of wearing the monitor. 2. Avoid excessive sweating to help maximize wear time. 3. Do not submerge the device, no hot tubs, and no swimming pools. 4. Keep any lotions or oils away from the patch. 5. After 24 hours you may shower with the patch on. Take brief showers with your back facing the shower head.  6. Do not remove patch once it has been placed because that will interrupt data and decrease adhesive wear time. 7. Push the button when you have any symptoms and write down what you were feeling. 8. Once you have completed wearing your monitor, remove and place into box which has postage paid and place in your outgoing mailbox.  9. If for some reason you have misplaced your box then call our office and we can provide another box and/or mail it off for you.   Follow-Up: At [facility], you and your health needs are our priority.  As part of our continuing mission to provide you with exceptional heart care, we have created designated Provider Care Teams.  These Care Teams include your primary Cardiologist (  physician) and Advanced Practice Providers (APPs -  Physician Assistants and Nurse Practitioners) who all work together to provide you with the care you need, when you need it.  We recommend signing up for the patient portal called "MyChart".  Sign up information is provided on this After Visit Summary.  MyChart is used to connect with patients for Virtual Visits (Telemedicine).  Patients are able to view lab/test results, encounter notes, upcoming appointments, etc.  Non-urgent messages can be sent to your provider  as well.   To learn more about what you can do with MyChart, go to [email or web address].    Your next appointment:   5-6 week(s)  The format for your next appointment:   In Person  Provider:   You may see Candee Furbish, MD or one of the following Advanced Practice Providers on your designated Care Team:    Murray Hodgkins, NP  Christell Faith, PA-C  Marrianne Mood, PA-C  Cadence Stratton, [place]  Laurann [place], NP    Other Instructions n/a

## 2021-04-18 ENCOUNTER — Encounter: Payer: Self-pay | Admitting: Physician Assistant

## 2021-04-22 NOTE — Telephone Encounter (Signed)
This was routed to me by accident yesterday

## 2021-04-23 NOTE — Telephone Encounter (Signed)
Have called and spoke to patient and made appointment. She will call if any changes.

## 2021-04-30 ENCOUNTER — Ambulatory Visit (INDEPENDENT_AMBULATORY_CARE_PROVIDER_SITE_OTHER): Payer: No Typology Code available for payment source

## 2021-04-30 ENCOUNTER — Other Ambulatory Visit: Payer: Self-pay

## 2021-04-30 DIAGNOSIS — R072 Precordial pain: Secondary | ICD-10-CM | POA: Diagnosis not present

## 2021-04-30 DIAGNOSIS — R42 Dizziness and giddiness: Secondary | ICD-10-CM

## 2021-04-30 DIAGNOSIS — R0600 Dyspnea, unspecified: Secondary | ICD-10-CM | POA: Diagnosis not present

## 2021-04-30 DIAGNOSIS — Z79899 Other long term (current) drug therapy: Secondary | ICD-10-CM

## 2021-04-30 DIAGNOSIS — R0609 Other forms of dyspnea: Secondary | ICD-10-CM

## 2021-04-30 DIAGNOSIS — I10 Essential (primary) hypertension: Secondary | ICD-10-CM

## 2021-04-30 LAB — ECHOCARDIOGRAM COMPLETE
AR max vel: 3.2 cm2
AV Area VTI: 3.16 cm2
AV Area mean vel: 3.16 cm2
AV Mean grad: 4 mmHg
AV Peak grad: 6.9 mmHg
Ao pk vel: 1.31 m/s
Area-P 1/2: 5.06 cm2
S' Lateral: 2.8 cm
Single Plane A4C EF: 52.2 %

## 2021-04-30 MED ORDER — PERFLUTREN LIPID MICROSPHERE
1.0000 mL | INTRAVENOUS | Status: AC | PRN
  Administered 2021-04-30: 2 mL via INTRAVENOUS

## 2021-05-05 ENCOUNTER — Telehealth: Payer: Self-pay | Admitting: Physician Assistant

## 2021-05-05 NOTE — Telephone Encounter (Signed)
Patient calling States that he was getting skin irritation from the heart monitor and ZIO said it would be ok to take off early Wanted to make office aware he wore it for 6 days

## 2021-05-07 DIAGNOSIS — M25561 Pain in right knee: Secondary | ICD-10-CM | POA: Insufficient documentation

## 2021-05-08 MED ORDER — FUROSEMIDE 20 MG PO TABS: 20.0000 mg | ORAL_TABLET | ORAL | 0 refills | Status: DC

## 2021-05-08 NOTE — Telephone Encounter (Signed)
Spoke to pt. Notified of Jacquelyn's recc.  Pt verbalized understanding.  He will take Lasix 20mg  every other day and repeat BMET at [facility] mall [facility] in 1 week.  Pt will let us know if s/s have improved after 1 week.  Pt requested we send Rx to [facility] [address] in [place], he is Engineer, site.  Notified pt I have updated pharmacy in chart.  Rx sent and lab orders placed.  Pt has no further questions at this time.

## 2021-05-09 NOTE — Telephone Encounter (Signed)
Noted  

## 2021-05-16 ENCOUNTER — Other Ambulatory Visit
Admission: RE | Admit: 2021-05-16 | Discharge: 2021-05-16 | Disposition: A | Discharge status: Home / Self Care | Payer: No Typology Code available for payment source | Source: Ambulatory Visit | Attending: Physician Assistant | Admitting: Physician Assistant

## 2021-05-16 DIAGNOSIS — Z79899 Other long term (current) drug therapy: Secondary | ICD-10-CM | POA: Diagnosis present

## 2021-05-16 DIAGNOSIS — I10 Essential (primary) hypertension: Secondary | ICD-10-CM | POA: Insufficient documentation

## 2021-05-16 LAB — BASIC METABOLIC PANEL
Anion gap: 9 (ref 5–15)
BUN: 18 mg/dL (ref 6–20)
CO2: 24 mmol/L (ref 22–32)
Calcium: 9 mg/dL (ref 8.9–10.3)
Chloride: 104 mmol/L (ref 98–111)
Creatinine, Ser: 1.07 mg/dL (ref 0.61–1.24)
GFR, Estimated: >60 mL/min (ref >60)
Glucose, Bld: 131 mg/dL — ABNORMAL HIGH (ref 70–99)
Potassium: 4 mmol/L (ref 3.5–5.1)
Sodium: 137 mmol/L (ref 135–145)

## 2021-05-29 ENCOUNTER — Ambulatory Visit: Payer: No Typology Code available for payment source | Admitting: Physician Assistant

## 2021-05-29 ENCOUNTER — Encounter: Payer: Self-pay | Admitting: Physician Assistant

## 2021-05-29 ENCOUNTER — Other Ambulatory Visit: Payer: Self-pay

## 2021-05-29 VITALS — BP 120/90 | HR 81 | Ht 70.0 in | Wt 301.0 lb

## 2021-05-29 DIAGNOSIS — I10 Essential (primary) hypertension: Secondary | ICD-10-CM

## 2021-05-29 DIAGNOSIS — E785 Hyperlipidemia, unspecified: Secondary | ICD-10-CM | POA: Diagnosis not present

## 2021-05-29 DIAGNOSIS — R400 Somnolence: Secondary | ICD-10-CM

## 2021-05-29 DIAGNOSIS — I5032 Chronic diastolic (congestive) heart failure: Secondary | ICD-10-CM

## 2021-05-29 DIAGNOSIS — K219 Gastro-esophageal reflux disease without esophagitis: Secondary | ICD-10-CM

## 2021-05-29 DIAGNOSIS — E119 Type 2 diabetes mellitus without complications: Secondary | ICD-10-CM

## 2021-05-29 DIAGNOSIS — Z905 Acquired absence of kidney: Secondary | ICD-10-CM

## 2021-05-29 DIAGNOSIS — R072 Precordial pain: Secondary | ICD-10-CM

## 2021-05-29 DIAGNOSIS — Z79899 Other long term (current) drug therapy: Secondary | ICD-10-CM

## 2021-05-29 MED ORDER — POTASSIUM CHLORIDE ER 10 MEQ PO TBCR: 10.0000 meq | EXTENDED_RELEASE_TABLET | ORAL | 3 refills | Status: DC

## 2021-05-29 MED ORDER — FUROSEMIDE 20 MG PO TABS: ORAL_TABLET | ORAL | 3 refills | Status: DC

## 2021-05-29 NOTE — Patient Instructions (Signed)
Medication Instructions:  Your physician has recommended you make the following change in your medication:   CHANGE Lasix (Furosemide) 20mg  EVERY OTHER DAY x 1 week   - THEN take Lasix 20mg  only AS NEEDED for weight gain of 3lbs overnight or 5lbs in 1 week  TAKE Potassium 25mEq once daily ONLY when taking Lasix.   *If you need a refill on your cardiac medications before your next appointment, please call your pharmacy*   Lab Work:  None ordered  Testing/Procedures:  None ordered  Follow-Up: At [facility], you and your health needs are our priority.  As part of our continuing mission to provide you with exceptional heart care, we have created designated Provider Care Teams.  These Care Teams include your primary Cardiologist (physician) and Advanced Practice Providers (APPs -  Physician Assistants and Nurse Practitioners) who all work together to provide you with the care you need, when you need it.  We recommend signing up for the patient portal called "MyChart".  Sign up information is provided on this After Visit Summary.  MyChart is used to connect with patients for Virtual Visits (Telemedicine).  Patients are able to view lab/test results, encounter notes, upcoming appointments, etc.  Non-urgent messages can be sent to your provider as well.   To learn more about what you can do with MyChart, go to [email or web address].    Your next appointment:   2 month(s)  The format for your next appointment:   In Person  Provider:   You may see Candee Furbish, MD or one of the following Advanced Practice Providers on your designated Care Team:    Marrianne Mood, [place]

## 2021-05-29 NOTE — Progress Notes (Signed)
Office Visit    Patient Name: Evan [name] Date of Encounter: 05/29/2021  PCP:  [name], NP   [place]  Cardiologist:  Candee Furbish, MD  Advanced Practice Provider:  No care team member to display Electrophysiologist:  None   Chief Complaint    Chief Complaint  Patient presents with   6 week follow up     Discuss Echo and Zio results. Patient c/o fluttering in chest, dizziness at times, chest discomfort two days ago, nausea, blurred vision and headache. Medications reviewed by the patient verbally.     55 y.o. male with history of chest pain with LHC performed 2019 showing normal cors, concentric hypertrophy/normal EF by 2019 cath, hypertension, hyperlipidemia, family history of premature CAD, obesity, GERD, esophagitis, solitary kidney s/p left nephrectomy in 2010, intolerance to nitrates/Imdur due to headache, intolerance to ACE inhibitors due to cough, and here today to for follow-up after recent echo and Zio results with report of chest discomfort, blurred vision, headache, and nausea..  Past Medical History    Past Medical History:  Diagnosis Date   Arthritis    Chronic kidney disease    Colon polyps    benign per pt   Diverticulitis    Hyperlipidemia    Hypertension    Lyme disease    Seizures ([place])    Past Surgical History:  Procedure Laterality Date   CHOLECYSTECTOMY  2021   HERNIA REPAIR     x 2   LEFT HEART CATH AND CORONARY ANGIOGRAPHY N/A 05/04/2018   Procedure: LEFT HEART CATH AND CORONARY ANGIOGRAPHY;  Surgeon: Leonie Man, MD;  Location: [place] [facility];  Service: Cardiovascular;  Laterality: N/A;   NECK SURGERY     NEPHRECTOMY Left 2010   for atrophy and reflux   PROSTATE BIOPSY     SHOULDER SURGERY Left     Allergies  Allergies  Allergen Reactions   Nuvigil [Armodafinil] Other (See Comments)    Seizures    Seroquel [Quetiapine] Other (See Comments)    Seizures.   Simvastatin Hives     History of Present Illness    Evan [name] is a 55 y.o. male with PMH as above.  He has history of hypertension, hyperlipidemia, family history of premature CAD, obesity, GERD, esophagitis, solitary kidney s/p left nephrectomy in 2010, and is being seen today to establish with [facility].  He reports a strong family history of CA and CAD. Specifically, he reports a mom that had triple bypass at age 15 and carotid artery dz. He has history of nephrectomy, hypertension, hospitalization for accelerating chest pain with cardiac catheterization showing no significant CAD, normal LVEDP, and here today for follow-up.   05/04/2018 echo performed for chest pain with mild concentric hypertrophy, EF 60 to 65%, NR WMA, G1 DD, trivial TR.   05/04/2018 LHC performed for symptoms worrisome for unstable angina with normal coronaries, LVEDP normal, EF greater than 65%, LVEDP normal.  Recommendation was to consider nonanginal chest pain.  11/2019 lower extremity Dopplers, performed for claudication-like symptoms from knees to feet after walking x1 year performed.  He reported feeling worse at night and that he had recently been experiencing the pain for the last 3 months with heaviness in both legs, as well as swelling.  Study showed no evidence of right or left lower extremity arterial disease.  Seen 04/17/2021 to establish with [facility].  He was referred to the clinic after seeing Dr. Mortimer Fries of  pulmonology for daytime sleepiness and signs of sleep apnea and recommendation for sleep study at home.  At that time, he reported chest pain that was ongoing for at least 1 week.  When seen in clinic 04/17/2021, he reported feeling poorly for the past couple of weeks.  He had chest pain approximately 1 week prior to his pulmonologist appointment after feeling poorly before that time.  He then felt an initial burst or episode of intense chest pain that lasted a split-second felt like a heart  squeeze that was intense to the point of his feeling he may have blacked out.  He did not lose consciousness.  He reported chest pain that waxed and waned while sitting at his desk.  This persisted throughout the rest of the day.  He reported dyspnea with exertion.  He reported feeling funny in his lower jaw.  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  He noted abdominal distention and recent gallbladder removal.  He had intermittent dizziness, often reporting the room spinning.  He had night sweats.  He reported recently elevated CK with work-up per PCP.  He had a bump in his neck, attributed to spine issues.  He reported significant stressors, including a wife with leukemia.  He reported hope for medication changes moving forward, including metformin given his research on this medicine.  He was hopeful to discontinue spironolactone.  It was noted he did not tolerate lisinopril due to cough.  He had a history of migraines and did not feel he would tolerate Imdur.  He reported weight gain of 50 to 80 pounds in the last 2 years.  He had daytime fatigue with sleep apnea work-up ongoing.  SBP at home 120s to 130s and DBP 70 to 84.  He felt as if he was holding onto fluid.  Recommendation was for echo and Zio and possible medication changes at RTC if indicated.  Cardiac monitoring showed predominantly normal sinus rhythm.  Slight P wave morphology changes were noted.  1 episode of AV block (second-degree AV block Mobitz 2) occurred, lasting a total of 3 seconds.  Isolated ectopy also noted.  Echo showed EF 60 to 65%, NR WMA, mild LVH, G1 DD.  Today, 05/29/2021, he returns to clinic with recent studies reviewed.  It was reviewed that his results were overall reassuring.  He reports today that his main complaint is of a headache on the top of his head.  He states he used to have migraines before in the past.  2 to 3 days ago, he had an episode of chest pain that came up through his  chest and was described as sharp.  This lasted for a while and then went into his lower jaw.  This happened 3-5 times.  The first time was when he got up in the morning and took a shower.  He sat on the edge of the bed, and this then eased off.  He estimates the episode went away in 10 minutes.  He then fell off for the rest of the day.  He reported still having night sweats, often soaking through a towel at night.  He has shortness of breath in his recliner without reclining all the way and often wakes up with tachypnea.  No shortness of breath at rest.  He had not spoken with his PCP regarding his night sweats.  He reported his neck bump is no longer bothering him.  His neck was often tight with a sizzling  noise in his head.  He wonders if he needed to get that reassessed, given a plate in his neck.  He reports a recent fall on the patio and that his knee is bothering him a lot lately.  He prefers to prioritize his knee over his neck.  He denies any syncope.  No signs or symptoms of bleeding.  He reports significant family stressors, including 2 brothers that passed away this year and that his wife continues to follow with oncology as they feel that her cancer may have returned.  Home Medications   Current Outpatient Medications  Medication Instructions   betamethasone dipropionate (DIPROLENE) 0.01 % cream 1 application, Topical, Daily   Cholecalciferol (VITAMIN D3) 5000 units CAPS 1 capsule, Oral, As directed   diphenhydrAMINE (BENADRYL) 25 mg, Oral, Daily   metFORMIN (GLUCOPHAGE-XR) 500 mg, Oral, Daily with breakfast, For diabetes.   mupirocin ointment (BACTROBAN) 2 % 1 application, Topical, Daily   olmesartan (BENICAR) 20 mg, Oral, Daily, For blood pressure.   omeprazole (PRILOSEC) 20 mg, Oral, Daily   spironolactone (ALDACTONE) 25 mg, Oral, Daily, For blood pressure.     Review of Systems    As detailed in the HPI. He reports ongoing episodes of CP, s/sx of OSA,.orthopnea, PND, recent fall,  DOE.  Edema unchanged.  He denies palpitations, n, v, dizziness, syncope, weight gain, or early satiety.  All other systems reviewed and are otherwise negative except as noted above.  Physical Exam    VS:  BP 120/90 (BP Location: Left Arm, Patient Position: Sitting, Cuff Size: Large)   Pulse 81   Ht 5\' 10"  (1.778 m)   Wt (!) 301 lb (136.5 kg)   SpO2 95%   BMI 43.19 kg/m  , BMI Body mass index is 43.19 kg/m. GEN: Well nourished, well developed, in no acute distress. HEENT: normal. Neck: Supple, no carotid bruits, or masses.  JVD difficult to assess due to body habitus Cardiac: RRR, no murmurs, rubs, or gallops. No clubbing, cyanosis.  1+ bilateral lower extremity edema.  Radials/DP/PT 2+ and equal bilaterally.  Respiratory:  Respirations regular and unlabored, distant breath sounds but otherwise clear to auscultation bilaterally. GI: Slightly firm, distended, nontender, BS + x 4. MS: no deformity or atrophy. Skin: warm and dry, no rash. Neuro:  Strength and sensation are intact. Psych: Normal affect.  Accessory Clinical Findings    ECG personally reviewed by me today - NSR, 81 bpm, LAD, LAFB, poor R wave as seen in prior ekgs in precordial and inferior leads - no acute changes.  VITALS Reviewed today   Temp Readings from Last 3 Encounters:  04/10/21 (!) 97.3 F (36.3 C) (Temporal)  02/14/21 97.8 F (36.6 C) (Temporal)  11/06/20 97.9 F (36.6 C) (Oral)   BP Readings from Last 3 Encounters:  05/29/21 120/90  04/17/21 110/82  04/10/21 126/90   Pulse Readings from Last 3 Encounters:  05/29/21 81  04/17/21 78  04/10/21 86    Wt Readings from Last 3 Encounters:  05/29/21 (!) 301 lb (136.5 kg)  04/17/21 300 lb (136.1 kg)  04/10/21 (!) 305 lb (138.3 kg)     LABS  reviewed today   Lab Results  Component Value Date   WBC 9.2 02/14/2021   HGB 16.6 02/14/2021   HCT 48.6 02/14/2021   MCV 92.3 02/14/2021   PLT 262.0 02/14/2021   Lab Results  Component Value Date    CREATININE 1.07 05/16/2021   BUN 18 05/16/2021   NA 137 05/16/2021  K 4.0 05/16/2021   CL 104 05/16/2021   CO2 24 05/16/2021   Lab Results  Component Value Date   ALT 32 02/14/2021   AST 26 02/14/2021   ALKPHOS 45 02/14/2021   BILITOT 0.6 02/14/2021   Lab Results  Component Value Date   CHOL 243 (H) 02/14/2021   HDL 40.90 02/14/2021   LDLCALC 164 (H) 02/14/2021   LDLDIRECT 173.0 04/14/2017   TRIG 186.0 (H) 02/14/2021   CHOLHDL 6 02/14/2021    Lab Results  Component Value Date   HGBA1C 6.6 (H) 02/14/2021   Lab Results  Component Value Date   TSH 2.18 02/14/2021     STUDIES/PROCEDURES reviewed today   US Arterial study 11/2019 Summary:  Right: Resting right ankle-brachial index is within normal range. No  evidence of significant right lower extremity arterial disease. The right  toe-brachial index is normal.  Left: Resting left ankle-brachial index is within normal range. No  evidence of significant left lower extremity arterial disease. The left  toe-brachial index is normal.   LHC  04/2018 Angiographically normal coronary arteries. The left ventricular systolic function is normal -- The left ventricular ejection fraction is greater than 65% by visual estimate. LV end diastolic pressure is normal. Non-anginal Chest Pain. Plan:   TR Band removal in PACU Holding Area - d/c home after bedrest   Echo 04/2018 - Left ventricle: The cavity size was normal. There was mild    concentric hypertrophy. Systolic function was normal. The    estimated ejection fraction was in the range of 60% to 65%. Wall    motion was normal; there were no regional wall motion    abnormalities. Doppler parameters are consistent with abnormal    left ventricular relaxation (grade 1 diastolic dysfunction).    There was no evidence of elevated ventricular filling pressure by    Doppler parameters.  - Aortic valve: There was no regurgitation.  - Right ventricle: Systolic function was  normal.  - Right atrium: The atrium was normal in size.  - Tricuspid valve: There was trivial regurgitation.  - Pulmonary arteries: Systolic pressure was within the normal    range.  - Inferior vena cava: The vessel was normal in size.  - Pericardium, extracardiac: There was no pericardial effusion.   Assessment & Plan    Chest pain -- No current chest pain, but reports ongoing CP this notes as above.  He reports several stressors and wonders if these are contributing as well as his sleep apnea currently undergoing work-up for CPAP.  EKG without acute ST/T changes. Most recent echo with EF nl and NRWMA.  Cardiac monitor without significant arrhythmia. 2019 cath showed normal cors.  Risk factors for CP 2/2 coronary insufficiency include family history, DM2, hyperlipidemia, hypertension, obesity, male, and age greater than 39. If ongoing sx, consider MPI, though current findings reassuring. Return precautions reviewed.  We reviewed recommendations for activity and heart healthy diet. Agree with work-up for sleep apnea. He does not tolerate simvastatin due to hives. Given HA history, he prefers to avoid nitrates. With ongoing sx, could consider addition of BB/CCB for antianginal effect at RTC.   Diastolic dysfunction -- Stable DOE. As noted before, suspect dyspnea likely multifactorial given deconditioning.  Given his solitary kidney, pt is hesitant for any diuresis.  Between visits, he was started on Lasix 20 mg every other day for 1 week.  He tolerated this well.  We will recheck a BMET and then plan for Lasix 20 mg every  other day for 1 week and then as needed for weight gain 3 pounds overnight or 5 pounds in 1 week.  Additional recommendations, if needed, pending BMET.  Continue with olmesartan and spironolactone. Follow-up labs with repeat BMET per PCP recommended. Continue home daily weights and BP checks.  Low-salt diet and fluid restrictions reviewed.    Hypertension, goal BP <130/80 --DBP  borderline at 90.  Suspect improvement with diuresis as above with Lasix every other day for 1 week and then as needed/sliding scale.  Continue current olmesartan and spironolactone. Continue home BP and wt monitoring. Discussed salt under 2g/day and fluid under 2L /day.   Dizziness -- Ambulatory cardiac monitoring reviewed and with predominantly normal sinus rhythm, AV block/second-degree AV block Mobitz 2, and ectopy.  No significant evidence of arrhythmia.  He continues to note symptoms today, including orthostasis-like symptoms.  Hydration encouraged, especially in heat, and with low-salt diet.  HLD --LDL 164 with Tg 186 and total cholesterol 243. He does not tolerate simvastatin per pt allergies. Could consider Crestor / Chauncey Cruel /[id number] / bempedoic acid / Vascepa / Incliseran for risk factor modification. Reassess at RTC. No CAC by cath or LE vascular dz. will defer for now.  DM2 -- Most recent hemoglobin A1c 6.6.  Ongoing glycemic control recommended for risk factor modification.  GERD --Continue PPI. No reported s/sx of bleeding.   Daytime somnolence / OSA sx ---Current workup for OSA per Pulmonology. Continue. Tx of OSA recommended from a cardiovascular standpoint if needed.   Medication changes: Lasix 20 mg every other day x1 week.  As needed Lasix for 3 pound weight gain overnight or 5 pounds in a week. Take potassium 10 M EQ only when taking Lasix. Labs ordered: BMET today, BMET in 1 week per PCP Studies / Imaging ordered: None Future considerations: MPI if ongoing symptoms, BB/CCB Disposition: RTC 2 monts   *Please be aware that the above documentation was completed voice recognition software and may contain dictation errors.       Arvil Chaco, PA-C

## 2021-06-06 ENCOUNTER — Other Ambulatory Visit: Payer: Self-pay

## 2021-06-06 ENCOUNTER — Ambulatory Visit: Payer: No Typology Code available for payment source

## 2021-06-06 DIAGNOSIS — R400 Somnolence: Secondary | ICD-10-CM

## 2021-06-07 ENCOUNTER — Other Ambulatory Visit: Payer: Self-pay | Admitting: Primary Care

## 2021-06-07 DIAGNOSIS — E119 Type 2 diabetes mellitus without complications: Secondary | ICD-10-CM

## 2021-07-04 ENCOUNTER — Other Ambulatory Visit: Payer: Self-pay

## 2021-07-04 DIAGNOSIS — G4733 Obstructive sleep apnea (adult) (pediatric): Secondary | ICD-10-CM | POA: Diagnosis not present

## 2021-07-14 DIAGNOSIS — G4733 Obstructive sleep apnea (adult) (pediatric): Secondary | ICD-10-CM | POA: Diagnosis not present

## 2021-07-15 ENCOUNTER — Other Ambulatory Visit: Payer: Self-pay

## 2021-07-15 ENCOUNTER — Telehealth: Payer: Self-pay | Admitting: Internal Medicine

## 2021-07-15 DIAGNOSIS — E119 Type 2 diabetes mellitus without complications: Secondary | ICD-10-CM

## 2021-07-15 DIAGNOSIS — G4733 Obstructive sleep apnea (adult) (pediatric): Secondary | ICD-10-CM

## 2021-07-15 NOTE — Telephone Encounter (Signed)
Dr. Halford Chessman has read this patient's HST & it has been scanned into patient's chart.  Ready to provide results to patient.

## 2021-07-15 NOTE — Telephone Encounter (Signed)
Lm x1 for patient.  

## 2021-07-16 MED ORDER — METFORMIN HCL ER 500 MG PO TB24: 500.0000 mg | ORAL_TABLET | Freq: Every day | ORAL | 0 refills | Status: DC

## 2021-07-16 NOTE — Telephone Encounter (Signed)
Spoke to patient and relayed below results.  Patient would like to proceed with cpap.  Order has been placed.  Nothing further needed at this time.

## 2021-08-11 ENCOUNTER — Other Ambulatory Visit: Payer: Self-pay | Admitting: *Deleted

## 2021-08-11 MED ORDER — POTASSIUM CHLORIDE ER 10 MEQ PO TBCR: 10.0000 meq | EXTENDED_RELEASE_TABLET | ORAL | 0 refills | Status: DC

## 2021-08-11 MED ORDER — FUROSEMIDE 20 MG PO TABS: ORAL_TABLET | ORAL | 0 refills | Status: AC

## 2021-08-22 ENCOUNTER — Other Ambulatory Visit: Payer: Self-pay

## 2021-08-22 ENCOUNTER — Encounter: Payer: Self-pay | Admitting: Physician Assistant

## 2021-08-22 ENCOUNTER — Ambulatory Visit: Payer: No Typology Code available for payment source | Admitting: Physician Assistant

## 2021-08-22 VITALS — BP 100/80 | HR 79 | Ht 70.0 in | Wt 300.5 lb

## 2021-08-22 DIAGNOSIS — R079 Chest pain, unspecified: Secondary | ICD-10-CM

## 2021-08-22 DIAGNOSIS — I5033 Acute on chronic diastolic (congestive) heart failure: Secondary | ICD-10-CM | POA: Diagnosis not present

## 2021-08-22 DIAGNOSIS — I208 Other forms of angina pectoris: Secondary | ICD-10-CM | POA: Diagnosis not present

## 2021-08-22 DIAGNOSIS — R6884 Jaw pain: Secondary | ICD-10-CM | POA: Diagnosis not present

## 2021-08-22 DIAGNOSIS — Z905 Acquired absence of kidney: Secondary | ICD-10-CM

## 2021-08-22 DIAGNOSIS — I10 Essential (primary) hypertension: Secondary | ICD-10-CM

## 2021-08-22 DIAGNOSIS — R400 Somnolence: Secondary | ICD-10-CM

## 2021-08-22 DIAGNOSIS — E119 Type 2 diabetes mellitus without complications: Secondary | ICD-10-CM

## 2021-08-22 DIAGNOSIS — E785 Hyperlipidemia, unspecified: Secondary | ICD-10-CM

## 2021-08-22 DIAGNOSIS — G4730 Sleep apnea, unspecified: Secondary | ICD-10-CM

## 2021-08-22 DIAGNOSIS — Z79899 Other long term (current) drug therapy: Secondary | ICD-10-CM | POA: Diagnosis not present

## 2021-08-22 DIAGNOSIS — Z01810 Encounter for preprocedural cardiovascular examination: Secondary | ICD-10-CM

## 2021-08-22 DIAGNOSIS — K219 Gastro-esophageal reflux disease without esophagitis: Secondary | ICD-10-CM

## 2021-08-22 NOTE — Progress Notes (Signed)
Office Visit    Patient Name: Evan [name] Date of Encounter: 08/22/2021  PCP:  [name], NP   [place]  Cardiologist:  Candee Furbish, MD  Advanced Practice Provider:  No care team member to display Electrophysiologist:  None   Chief Complaint    Chief Complaint  Patient presents with   Other    2 month f/u c/o a lot of dry coughing and x1 wk ago 2-3 days not feeling well felt palpitations radiated to jaw for about 3 days on.off. Meds reviewed verbally with pt.    55 y.o. male with history of chest pain with LHC performed 2019 showing normal cors, concentric hypertrophy/normal EF by 2019 cath, hypertension, hyperlipidemia, family history of premature CAD, obesity, GERD, esophagitis, solitary kidney s/p left nephrectomy in 2010, intolerance to nitrates/Imdur due to headache, intolerance to ACE inhibitors due to cough, and here today to for 3 month follow-up.  Past Medical History    Past Medical History:  Diagnosis Date   Arthritis    Chronic kidney disease    Colon polyps    benign per pt   Diverticulitis    Hyperlipidemia    Hypertension    Lyme disease    Seizures ([place])    Past Surgical History:  Procedure Laterality Date   CHOLECYSTECTOMY  2021   HERNIA REPAIR     x 2   LEFT HEART CATH AND CORONARY ANGIOGRAPHY N/A 05/04/2018   Procedure: LEFT HEART CATH AND CORONARY ANGIOGRAPHY;  Surgeon: Leonie Man, MD;  Location: [facility];  Service: Cardiovascular;  Laterality: N/A;   NECK SURGERY     NEPHRECTOMY Left 2010   for atrophy and reflux   PROSTATE BIOPSY     SHOULDER SURGERY Left     Allergies  Allergies  Allergen Reactions   Nuvigil [Armodafinil] Other (See Comments)    Seizures    Seroquel [Quetiapine] Other (See Comments)    Seizures.   Simvastatin Hives    History of Present Illness    Stevon Brownson is a 55 y.o. male with PMH as above.  He has history of hypertension, hyperlipidemia, family  history of premature CAD, obesity, GERD, esophagitis, solitary kidney s/p left nephrectomy in 2010, and is being seen today to establish with [facility].  He reports a strong family history of CA and CAD. Specifically, he reports a mom that had triple bypass at age 39 and carotid artery dz. He has history of nephrectomy, hypertension, hospitalization for accelerating chest pain with cardiac catheterization showing no significant CAD, normal LVEDP, and here today for follow-up.   05/04/2018 echo performed for chest pain with mild concentric hypertrophy, EF 60 to 65%, NR WMA, G1 DD, trivial TR.   05/04/2018 LHC performed for symptoms worrisome for unstable angina with normal coronaries, LVEDP normal, EF greater than 65%, LVEDP normal.  Recommendation was to consider nonanginal chest pain.  11/2019 lower extremity Dopplers, performed for claudication-like symptoms from knees to feet after walking x1 year performed.  He reported feeling worse at night and that he had recently been experiencing the pain for the last 3 months with heaviness in both legs, as well as swelling.  Study showed no evidence of right or left lower extremity arterial disease.  Seen 04/17/2021 to establish with [facility].  He was referred to the clinic after seeing Dr. Mortimer Fries of pulmonology for daytime sleepiness and signs of sleep apnea and recommendation for sleep study at  home.  At that time, he reported chest pain that was ongoing for at least 1 week.  When seen in clinic 04/17/2021, he reported feeling poorly for the past couple of weeks.  He had chest pain approximately 1 week prior to his pulmonologist appointment after feeling poorly before that time.  He then felt an initial burst or episode of intense chest pain that lasted a split-second felt like a heart squeeze that was intense to the point of his feeling he may have blacked out.  He did not lose consciousness.  He reported chest pain that waxed and  waned while sitting at his desk.  This persisted throughout the rest of the day.  He reported dyspnea with exertion.  He reported feeling funny in his lower jaw.  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  He noted abdominal distention and recent gallbladder removal.  He had intermittent dizziness, often reporting the room spinning.  He had night sweats.  He reported recently elevated CK with work-up per PCP.  He had a bump in his neck, attributed to spine issues.  He reported significant stressors, including a wife with leukemia.  He reported hope for medication changes moving forward, including metformin given his research on this medicine.  He was hopeful to discontinue spironolactone.  It was noted he did not tolerate lisinopril due to cough.  He had a history of migraines and did not feel he would tolerate Imdur.  He reported weight gain of 50 to 80 pounds in the last 2 years.  He had daytime fatigue with sleep apnea work-up ongoing.  SBP at home 120s to 130s and DBP 70 to 84.  He felt as if he was holding onto fluid.  Recommendation was for echo and Zio and possible medication changes at RTC if indicated.  Cardiac monitoring showed predominantly normal sinus rhythm.  Slight P wave morphology changes were noted.  1 episode of AV block (second-degree AV block Mobitz 2) occurred, lasting a total of 3 seconds.  Isolated ectopy also noted.  Echo showed EF 60 to 65%, NR WMA, mild LVH, G1 DD.  Seen 05/29/21  with above studies reviewed and report of episodes of CP radiating into his jaw with further workup deferred. He reported significant family stressors, including 2 brothers that passed away the same year and wife with cancer that may have returned.  Today, 08/22/21, he returns to clinic and describes episodes of "abnormal feelings in his chest" and exertional bilateral jaw pain. These episodes started 1.5 weeks ago and were on an off for the next 3-4 days or into the weekend  before his visit. The abnormal feeling in his chest was further described as "like palpitations but less intense" and also reported as "gurgly but not like his GERD." Whenever walking, he felt a pain or fullness of his bilateral neck with associated bilateral jaw pain. He stated the jaw pain would become so intense it would stop him in his tracks. Examples included when walking at work, as well as when shopping. Sx usually lasted under 1 minute and stopped with rest but then would recur when walking again. Since then, he has been very fatigued. He also reports a new dry cough that he describes as similar to that on lisinopril and exacerbated by breathing. For the last two days, he has felt less sx, other than fatigue and an episode of palpitations. His CPAP is backordered 4 months, though he is sleeping better since off  of benadryl. He went online and bought lymphedema pumps, which helped with his LEE, though he has not used them in a while. His LEE is chronically worse in his L over R. Currently, taking his PRN lasix and potassium 2-3 times per week. His legs do not ache as much as in the past. He states that sometimes, when lays flat, gets a panicky feeling, though usually when on his right side. When he had his episodes of jaw pain, his SBP at home was 154-158 with DBP 84-85. He reports usually that BP 130/78-80. He usually drinks 1 cup of coffee. He reports an upcoming knee surgery. Wt 292 lbs at home. He reports sx of GERD, though that he takes measures to control them, and that the sx as above are different than that of his GERD.   Home Medications   Current Outpatient Medications  Medication Instructions   betamethasone dipropionate (DIPROLENE) [id number] % cream 1 application, Topical, Daily   Cholecalciferol (VITAMIN D3) 5000 units CAPS 1 capsule, Oral, As directed   diphenhydrAMINE (BENADRYL) 25 mg, Daily   furosemide (LASIX) 20 MG tablet Take 1 tablet ('20mg'$ ) AS NEEDED for wt gain of 3lbs overnight or 5  lbs in one week.   metFORMIN (GLUCOPHAGE-XR) 500 mg, Oral, Daily with breakfast, For diabetes.   mupirocin ointment (BACTROBAN) 2 % 1 application, Topical, Daily   olmesartan (BENICAR) 20 mg, Oral, Daily, For blood pressure.   omeprazole (PRILOSEC) 20 mg, Oral, Daily   potassium chloride (KLOR-CON) 10 MEQ tablet 10 mEq, Oral, As directed, Take one tablet only when take Lasix.   spironolactone (ALDACTONE) 25 mg, Oral, Daily, For blood pressure.     Review of Systems    As detailed in the HPI. He reports episodes of "abnormal chest feeling" and jaw pain, dry cough, palpitations, fatigue, LEE with L>R (chronic), s/sx of OSA, orthopnea, PND, GERD.  He has less leg cramps. He denies n, v, dizziness, syncope, weight gain, or early satiety.  All other systems reviewed and are otherwise negative except as noted above.   Physical Exam    VS:  BP 100/80 (BP Location: Left Arm, Patient Position: Sitting, Cuff Size: Normal)   Pulse 79   Ht '5\' 10"'$  (1.778 m)   Wt (!) 300 lb 8 oz (136.3 kg)   SpO2 96%   BMI 43.12 kg/m  , BMI Body mass index is 43.12 kg/m. GEN: Well nourished, well developed, in no acute distress. HEENT: normal. Neck: Supple, no carotid bruits, or masses.  JVD difficult to assess due to body habitus Cardiac: RRR, no murmurs, rubs, or gallops. No clubbing, cyanosis.  1+ bilateral lower extremity edema L>R.  Radials/DP/PT 2+ and equal bilaterally.  Respiratory:  Respirations regular and unlabored, trace bilateral crackles. GI: firm, distended, nontender, BS + x 4. MS: no deformity or atrophy. Skin: warm and dry, no rash. Neuro:  Strength and sensation are intact. Psych: Normal affect.  Accessory Clinical Findings    ECG personally reviewed by me today - NSR, 79bpm, LAD, LAFB, poor R wave as seen in prior ekgs in precordial and inferior leads - no acute changes.  VITALS Reviewed today   Temp Readings from Last 3 Encounters:  04/10/21 (!) 97.3 F (36.3 C) (Temporal)  02/14/21  97.8 F (36.6 C) (Temporal)  11/06/20 97.9 F (36.6 C) (Oral)   BP Readings from Last 3 Encounters:  08/22/21 100/80  05/29/21 120/90  04/17/21 110/82   Pulse Readings from Last 3 Encounters:  08/22/21 79  05/29/21 81  04/17/21 78    Wt Readings from Last 3 Encounters:  08/22/21 (!) 300 lb 8 oz (136.3 kg)  05/29/21 (!) 301 lb (136.5 kg)  04/17/21 300 lb (136.1 kg)     LABS  reviewed today   Lab Results  Component Value Date   WBC 9.2 02/14/2021   HGB 16.6 02/14/2021   HCT 48.6 02/14/2021   MCV 92.3 02/14/2021   PLT 262.0 02/14/2021   Lab Results  Component Value Date   CREATININE 1.07 05/16/2021   BUN 18 05/16/2021   NA 137 05/16/2021   K 4.0 05/16/2021   CL 104 05/16/2021   CO2 24 05/16/2021   Lab Results  Component Value Date   ALT 32 02/14/2021   AST 26 02/14/2021   ALKPHOS 45 02/14/2021   BILITOT 0.6 02/14/2021   Lab Results  Component Value Date   CHOL 243 (H) 02/14/2021   HDL 40.90 02/14/2021   LDLCALC 164 (H) 02/14/2021   LDLDIRECT 173.0 04/14/2017   TRIG 186.0 (H) 02/14/2021   CHOLHDL 6 02/14/2021    Lab Results  Component Value Date   HGBA1C 6.6 (H) 02/14/2021   Lab Results  Component Value Date   TSH 2.18 02/14/2021     STUDIES/PROCEDURES reviewed today   Monitor  05/14/21 Average heart rate 88 bpm, sinus rhythm. Episode of second-degree heart block type I phenomenon at 6:11 AM presumably during sleep with associated first-degree AV block. Benign. Increased vagal tone. No significant pauses No atrial fibrillation, no ventricular tachycardia Rare PVCs and PACs. Symptoms of cold, sweats, dizziness while watching TV in the early afternoon were associated with sinus rhythm heart rate 88 bpm. No adverse arrhythmias. Overall reassuring heart monitor. Patch Wear Time:  5 days and 9 hours (2022-05-18T08:10:57-0400 to 2022-05-23T17:48:31-0400) Patient had a min HR of 22 bpm, max HR of 127 bpm, and avg HR of 88 bpm. Predominant underlying  rhythm was Sinus Rhythm. Slight P wave morphology changes were noted.  SVEs were rare (<1.0%), SVE Couplets were rare (<1.0%), and no SVE Triplets were present. Isolated VEs were rare (<1.0%), VE Couplets were rare (<1.0%), and no VE Triplets were present.  Echo 04/30/21  1. Left ventricular ejection fraction, by estimation, is 60 to 65%. The  left ventricle has normal function. The left ventricle has no regional  wall motion abnormalities. There is mild left ventricular hypertrophy.  Left ventricular diastolic parameters  are consistent with Grade I diastolic dysfunction (impaired relaxation).   2. Right ventricular systolic function is normal. The right ventricular  size is normal.   3. The mitral valve is normal in structure. No evidence of mitral valve  regurgitation.   4. The aortic valve is grossly normal. Aortic valve regurgitation is not  visualized.    US Arterial study 11/2019 Summary:  Right: Resting right ankle-brachial index is within normal range. No  evidence of significant right lower extremity arterial disease. The right  toe-brachial index is normal.  Left: Resting left ankle-brachial index is within normal range. No  evidence of significant left lower extremity arterial disease. The left  toe-brachial index is normal.   LHC  04/2018 Angiographically normal coronary arteries. The left ventricular systolic function is normal -- The left ventricular ejection fraction is greater than 65% by visual estimate. LV end diastolic pressure is normal. Non-anginal Chest Pain. Plan:   TR Band removal in PACU Holding Area - d/c home after bedrest   Echo 04/2018 - Left ventricle: The cavity size was  normal. There was mild    concentric hypertrophy. Systolic function was normal. The    estimated ejection fraction was in the range of 60% to 65%. Wall    motion was normal; there were no regional wall motion    abnormalities. Doppler parameters are consistent with abnormal     left ventricular relaxation (grade 1 diastolic dysfunction).    There was no evidence of elevated ventricular filling pressure by    Doppler parameters.  - Aortic valve: There was no regurgitation.  - Right ventricle: Systolic function was normal.  - Right atrium: The atrium was normal in size.  - Tricuspid valve: There was trivial regurgitation.  - Pulmonary arteries: Systolic pressure was within the normal    range.  - Inferior vena cava: The vessel was normal in size.  - Pericardium, extracardiac: There was no pericardial effusion.   Assessment & Plan    Exertional jaw pain -- Reports exertional jaw pain as outlined above with increasing sx of volume overload and palpitations. Most recent echo with EF nl and NRWMA.  Cardiac monitor without significant arrhythmia. 2019 cath showed normal cors.  Risk factors for CP 2/2 coronary insufficiency include family history, DM2, hyperlipidemia, hypertension, obesity, male, and age greater than 58. Given ongoing sx, plan for MPI. We did discuss his previous reassuring workup and that some of his sx could certainly be due to increased volume status. Will diurese as below to see if improvement in sx before that time. RF modification recommended, though he does not tolerate simvastatin due to hives. Given HA history, he prefers to avoid nitrates. With ongoing sx after diuresis, and if room in BP, could consider addition of BB/CCB for antianginal effect at RTC.   Acute on chronic diastolic heart failure -- Reports progressive sx of overload.  Given his solitary kidney, pt has been hesitant for any diuresis in the past. Given his progressive sx, we will temporarily increase to lasix '20mg'$  daily, Kcl tab 11mq daily x7d then drop back to his previous as needed dosing. He reports he is currently taking lasix 2-3 times per week or every other day.  Given soft BP, we will decrease to olmesartan '10mg'$  daily to allow for up-titration of diuresis for 7 days. After 1  week, he will return to PRN lasix/Kcl tab and olmesartan '20mg'$  daily. Continue current dose spironolactone. BMET today. Follow-up labs with repeat BMET in 1-2 weeks. Continue home daily weights and BP checks.  Low-salt diet and fluid restrictions reviewed.    Hypertension, goal BP <130/80 --BP soft today.  Instructions regarding increased lasix as above with reduced dose of olmesartan during that time. Continue spironolactone. Continue home BP and wt monitoring. Discussed salt under 2g/day and fluid under 2L /day.   HLD --LDL 164 with Tg 186 and total cholesterol 243. He does not tolerate simvastatin per pt allergies. Could consider Crestor / ZChauncey Cruel/BQ:[id number]/ bempedoic acid / Vascepa / Incliseran for risk factor modification. Reassess at RTC. No CAC by cath or LE vascular dz. will defer for now.  DM2 -- Most recent hemoglobin A1c 6.6.  Ongoing glycemic control recommended for risk factor modification.  GERD --Continue PPI. No reported s/sx of bleeding.   Daytime somnolence / OSA sx ---CPAP on backorder. Tx of OSA recommended from a cardiovascular standpoint as discussed.  -----------------  Medication changes:  --Lasix 20 mg qd x1 week and after that time reduce back down to PRN Lasix for 3 pound weight gain overnight or 5 pounds in  a week after that time.  --KCL tab 10 M EQ x1 week (with daily lasix) and thereafter only when taking PRN Lasix. --Reduce to olmesartan '10mg'$  qd x1 week (to allow for room in BP during diuresis) then return to olmesartan '20mg'$  daily thereafter.  Labs ordered: BMET today, BMET in 1 -2 weeks Studies / Imaging ordered: MPI Future considerations: cholesterol control, BB/CCB Disposition: RTC after MPI   *Please be aware that the above documentation was completed voice recognition software and may contain dictation errors.       Arvil Chaco, PA-C

## 2021-08-22 NOTE — Patient Instructions (Signed)
Medication Instructions:   For 1 WEEK only Lasix 20 mg DAILY Potassium 10 mEq DAILY Olmesartan 10 mg DAILY After a week go back to lasix and potassium AS NEEDED and olmesartan 20 mg daily  *If you need a refill on your cardiac medications before your next appointment, please call your pharmacy*   Lab Work: BMP (today BMP in one week (9/16 at 10 am)  LABS WILL APPEAR ON MYCHART, ABNORMAL RESULTS WILL BE CALLED  Testing/Procedures: Lexiscan (stress test)   Follow-Up: At [facility], you and your health needs are our priority.  As part of our continuing mission to provide you with exceptional heart care, we have created designated Provider Care Teams.  These Care Teams include your primary Cardiologist (physician) and Advanced Practice Providers (APPs -  Physician Assistants and Nurse Practitioners) who all work together to provide you with the care you need, when you need it.  Your next appointment:   2 week(s)  The format for your next appointment:   In Person  Provider:   Marrianne Mood, PA-C   Other Instructions [facility] MYOVIEW [facility] Adam)  Your caregiver has ordered a Stress Test with nuclear imaging. The purpose of this test is to evaluate the blood supply to your heart muscle. This procedure is referred to as a "Non-Invasive Stress Test." This is because other than having an IV started in your vein, nothing is inserted or "invades" your body. Cardiac stress tests are done to find areas of poor blood flow to the heart by determining the extent of coronary artery disease (CAD). Some patients exercise on a treadmill, which naturally increases the blood flow to your heart, while others who are  unable to walk on a treadmill due to physical limitations have a pharmacologic/chemical stress agent called Lexiscan . This medicine will mimic walking on a treadmill by temporarily increasing your coronary blood flow.   Please note: these test may take anywhere between 2-4 hours to  complete  PLEASE REPORT TO [place] WILL DIRECT YOU WHERE TO GO  Instructions regarding medication:   _X__ : Hold diabetes medication morning of procedure Hold metformin 24 hrs prior to procedure and 48 hours after the procedure. May take metformin after the 48 hours.    ____:  Hold betablocker(s) night before procedure and morning of procedure None  ____:  Hold other medications as follows: Fluid pills (lasix and spironolactone)  How to prepare for your Myoview test:  Do not eat or drink after midnight No caffeine for 24 hours prior to test No smoking 24 hours prior to test. ALL your medication may be taken with a few sips of water.   (Except for the meds mention above) Please wear a short sleeve shirt. Comfortable pants are appropriate. No perfume, cologne or lotion.  PLEASE NOTIFY THE OFFICE AT LEAST 49 HOURS IN ADVANCE IF YOU ARE UNABLE TO KEEP YOUR APPOINTMENT.  [phone] AND  PLEASE NOTIFY NUCLEAR MEDICINE AT [facility] AT LEAST 24 HOURS IN ADVANCE IF YOU ARE UNABLE TO KEEP YOUR APPOINTMENT. [phone]

## 2021-08-23 LAB — BASIC METABOLIC PANEL
BUN/Creatinine Ratio: 12 (ref 9–20)
BUN: 13 mg/dL (ref 6–24)
CO2: 24 mmol/L (ref 20–29)
Calcium: 9.5 mg/dL (ref 8.7–10.2)
Chloride: 101 mmol/L (ref 96–106)
Creatinine, Ser: 1.06 mg/dL (ref 0.76–1.27)
Glucose: 92 mg/dL (ref 65–99)
Potassium: 4.6 mmol/L (ref 3.5–5.2)
Sodium: 140 mmol/L (ref 134–144)
eGFR: 82 mL/min/{1.73_m2} (ref >59)

## 2021-08-24 ENCOUNTER — Encounter: Payer: Self-pay | Admitting: Physician Assistant

## 2021-08-28 ENCOUNTER — Other Ambulatory Visit: Payer: Self-pay

## 2021-08-28 ENCOUNTER — Ambulatory Visit
Admission: RE | Admit: 2021-08-28 | Discharge: 2021-08-28 | Disposition: A | Discharge status: Home / Self Care | Payer: No Typology Code available for payment source | Source: Ambulatory Visit | Attending: Physician Assistant | Admitting: Physician Assistant

## 2021-08-28 ENCOUNTER — Encounter
Admission: RE | Admit: 2021-08-28 | Discharge: 2021-08-28 | Disposition: A | Discharge status: Home / Self Care | Payer: No Typology Code available for payment source | Source: Ambulatory Visit | Attending: Physician Assistant | Admitting: Physician Assistant

## 2021-08-28 ENCOUNTER — Ambulatory Visit: Payer: No Typology Code available for payment source

## 2021-08-28 ENCOUNTER — Other Ambulatory Visit
Admission: RE | Admit: 2021-08-28 | Discharge: 2021-08-28 | Disposition: A | Discharge status: Home / Self Care | Payer: No Typology Code available for payment source | Source: Ambulatory Visit | Attending: Physician Assistant | Admitting: Physician Assistant

## 2021-08-28 DIAGNOSIS — R6884 Jaw pain: Secondary | ICD-10-CM | POA: Diagnosis present

## 2021-08-28 DIAGNOSIS — Z79899 Other long term (current) drug therapy: Secondary | ICD-10-CM

## 2021-08-28 DIAGNOSIS — I208 Other forms of angina pectoris: Secondary | ICD-10-CM | POA: Insufficient documentation

## 2021-08-28 LAB — BASIC METABOLIC PANEL
Anion gap: 9 (ref 5–15)
BUN: 17 mg/dL (ref 6–20)
CO2: 28 mmol/L (ref 22–32)
Calcium: 9.1 mg/dL (ref 8.9–10.3)
Chloride: 99 mmol/L (ref 98–111)
Creatinine, Ser: 0.97 mg/dL (ref 0.61–1.24)
GFR, Estimated: >60 mL/min (ref >60)
Glucose, Bld: 128 mg/dL — ABNORMAL HIGH (ref 70–99)
Potassium: 3.9 mmol/L (ref 3.5–5.1)
Sodium: 136 mmol/L (ref 135–145)

## 2021-08-28 MED ORDER — TECHNETIUM TC 99M TETROFOSMIN IV KIT
30.0000 | PACK | Freq: Once | INTRAVENOUS | Status: AC | PRN
  Administered 2021-08-28: 32.8 via INTRAVENOUS

## 2021-08-28 MED ORDER — REGADENOSON 0.4 MG/5ML IV SOLN
0.4000 mg | Freq: Once | INTRAVENOUS | Status: AC
  Administered 2021-08-28: 0.4 mg via INTRAVENOUS

## 2021-08-28 MED ORDER — TECHNETIUM TC 99M TETROFOSMIN IV KIT
10.0000 | PACK | Freq: Once | INTRAVENOUS | Status: AC | PRN
  Administered 2021-08-28: 10.5 via INTRAVENOUS

## 2021-08-29 ENCOUNTER — Other Ambulatory Visit: Payer: No Typology Code available for payment source

## 2021-08-29 LAB — NM MYOCAR MULTI W/SPECT W/WALL MOTION / EF
LV dias vol: 83 mL (ref 62–150)
LV sys vol: 35 mL
Nuc Stress EF: 58 %
Rest Nuclear Isotope Dose: 10.5 mCi
SDS: 2
SRS: 3
SSS: 3
ST Depression (mm): 0 mm
Stress Nuclear Isotope Dose: 32.8 mCi
TID: 1.04

## 2021-09-04 ENCOUNTER — Telehealth: Payer: Self-pay | Admitting: Physician Assistant

## 2021-09-04 NOTE — Telephone Encounter (Signed)
Called to give the patient stress test results. Lmtcb.

## 2021-09-04 NOTE — Telephone Encounter (Signed)
Visser, Jacquelyn D, PA-C  P [facility] Triage Good news!  Stress test results very reassuring  No findings concerning for blockages.  No concerning EKG changes.  Overall, ruled a low risk study.

## 2021-09-08 ENCOUNTER — Encounter: Payer: Self-pay | Admitting: Physician Assistant

## 2021-09-08 ENCOUNTER — Other Ambulatory Visit: Payer: Self-pay

## 2021-09-08 ENCOUNTER — Telehealth (INDEPENDENT_AMBULATORY_CARE_PROVIDER_SITE_OTHER): Payer: No Typology Code available for payment source | Admitting: Physician Assistant

## 2021-09-08 VITALS — BP 139/75 | HR 80 | Ht 70.0 in | Wt 294.0 lb

## 2021-09-08 DIAGNOSIS — I5032 Chronic diastolic (congestive) heart failure: Secondary | ICD-10-CM

## 2021-09-08 DIAGNOSIS — Z79899 Other long term (current) drug therapy: Secondary | ICD-10-CM

## 2021-09-08 DIAGNOSIS — R072 Precordial pain: Secondary | ICD-10-CM

## 2021-09-08 DIAGNOSIS — I2089 Other forms of angina pectoris: Secondary | ICD-10-CM

## 2021-09-08 DIAGNOSIS — K219 Gastro-esophageal reflux disease without esophagitis: Secondary | ICD-10-CM

## 2021-09-08 DIAGNOSIS — E785 Hyperlipidemia, unspecified: Secondary | ICD-10-CM | POA: Diagnosis not present

## 2021-09-08 DIAGNOSIS — I208 Other forms of angina pectoris: Secondary | ICD-10-CM | POA: Diagnosis not present

## 2021-09-08 NOTE — Patient Instructions (Signed)
Medication Instructions: - Your physician recommends that you continue on your current medications as directed. Please refer to the Current Medication list given to you today.  Think about amlodipine or carvedilol as a medication for additional blood pressure support. (See education attached below)  Think about Repatha for cholesterol control. (See education attached below)  *If you need a refill on your cardiac medications before your next appointment, please call your pharmacy*   Lab Work: - none ordered  If you have labs (blood work) drawn today and your tests are completely normal, you will receive your results only by: [place] (if you have MyChart) OR A paper copy in the mail If you have any lab test that is abnormal or we need to change your treatment, we will call you to review the results.   Testing/Procedures: -none ordered   Follow-Up: At [facility], you and your health needs are our priority.  As part of our continuing mission to provide you with exceptional heart care, we have created designated Provider Care Teams.  These Care Teams include your primary Cardiologist (physician) and Advanced Practice Providers (APPs -  Physician Assistants and Nurse Practitioners) who all work together to provide you with the care you need, when you need it.  We recommend signing up for the patient portal called "MyChart".  Sign up information is provided on this After Visit Summary.  MyChart is used to connect with patients for Virtual Visits (Telemedicine).  Patients are able to view lab/test results, encounter notes, upcoming appointments, etc.  Non-urgent messages can be sent to your provider as well.   To learn more about what you can do with MyChart, go to [email or web address].    Your next appointment:   4-6 month(s)  The format for your next appointment:   In Person  Provider:   Any Primary Cardilogist (needs to be a MD- switching form Dr. Marlou Porch in  [place])   Other Instructions    NORVASC (Amlodipine) Tablets What is this medication? AMLODIPINE (am LOE di peen) treats high blood pressure and prevents chest pain (angina). It works by relaxing the blood vessels, which helps decrease the amount of work your heart has to do. It belongs to a group of medications called calcium channel blockers. This medicine may be used for other purposes; ask your health care provider or pharmacist if you have questions. COMMON BRAND NAME(S): Norvasc What should I tell my care team before I take this medication? They need to know if you have any of these conditions: Heart disease Liver disease An unusual or allergic reaction to amlodipine, other medications, foods, dyes, or preservatives Pregnant or trying to get pregnant Breast-feeding How should I use this medication? Take this medication by mouth. Take it as directed on the prescription label at the same time every day. You can take it with or without food. If it upsets your stomach, take it with food. Keep taking it unless your care team tells you to stop. Talk to your care team about the use of this medication in children. While it may be prescribed for children as young as 6 for selected conditions, precautions do apply. Overdosage: If you think you have taken too much of this medicine contact a poison control center or emergency room at once. NOTE: This medicine is only for you. Do not share this medicine with others. What if I miss a dose? If you miss a dose, take it as soon as you can. If it is almost time for  your next dose, take only that dose. Do not take double or extra doses. What may interact with this medication? Clarithromycin Cyclosporine Diltiazem Itraconazole Simvastatin Tacrolimus This list may not describe all possible interactions. Give your health care provider a list of all the medicines, herbs, non-prescription drugs, or dietary supplements you use. Also tell them if  you smoke, drink alcohol, or use illegal drugs. Some items may interact with your medicine. What should I watch for while using this medication? Visit your health care provider for regular checks on your progress. Check your blood pressure as directed. Ask your health care provider what your blood pressure should be. Also, find out when you should contact him or her. Do not treat yourself for coughs, colds, or pain while you are using this medication without asking your health care provider for advice. Some medications may increase your blood pressure. You may get drowsy or dizzy. Do not drive, use machinery, or do anything that needs mental alertness until you know how this medication affects you. Do not stand up or sit up quickly, especially if you are an older patient. This reduces the risk of dizzy or fainting spells. Alcohol can make you more drowsy and dizzy. Avoid alcoholic drinks. What side effects may I notice from receiving this medication? Side effects that you should report to your care team as soon as possible: Allergic reactions-skin rash, itching, hives, swelling of the face, lips, tongue, or throat Heart attack-pain or tightness in the chest, shoulders, arms, or jaw, nausea, shortness of breath, cold or clammy skin, feeling faint or lightheaded Low blood pressure-dizziness, feeling faint or lightheaded, blurry vision Side effects that usually do not require medical attention (report these to your care team if they continue or are bothersome): Facial flushing, redness Heart palpitations-rapid, pounding, or irregular heartbeat Nausea Stomach pain Swelling of the ankles, hands, or feet This list may not describe all possible side effects. Call your doctor for medical advice about side effects. You may report side effects to FDA at 1-800-FDA-1088. Where should I keep my medication? Keep out of the reach of children and pets. Store at room temperature between 20 and 25 degrees C (68 and  77 degrees F). Protect from light and moisture. Keep the container tightly closed. Get rid of any unused medication after the expiration date. To get rid of medications that are no longer needed or have expired: Take the medication to a medication take-back program. Check with your pharmacy or law enforcement to find a location. If you cannot return the medication, check the label or package insert to see if the medication should be thrown out in the garbage or flushed down the toilet. If you are not sure, ask your health care provider. If it is safe to put in the trash, empty the medication out of the container. Mix the medication with cat litter, dirt, coffee grounds, or other unwanted substance. Seal the mixture in a bag or container. Put it in the trash. NOTE: This sheet is a summary. It may not cover all possible information. If you have questions about this medicine, talk to your doctor, pharmacist, or health care provider.  2022 Elsevier/Gold Standard (2020-10-26 14:59:47)     COREG (Carvedilol) Tablets What is this medication? CARVEDILOL (KAR ve dil ol) treats high blood pressure and heart failure. It may also be used to prevent further damage after a heart attack. It works by lowering your blood pressure and heart rate, making it easier for your heart to pump  blood to the rest of your body. It belongs to a group of medications called beta blockers. This medicine may be used for other purposes; ask your health care provider or pharmacist if you have questions. COMMON BRAND NAME(S): Coreg What should I tell my care team before I take this medication? They need to know if you have any of these conditions: Circulation problems Diabetes History of heart attack or heart disease Liver disease Lung or breathing disease, like asthma Pheochromocytoma Slow or irregular heartbeat Thyroid disease An unusual or allergic reaction to carvedilol, other beta blockers, medications, foods, dyes, or  preservatives Pregnant or trying to get pregnant Breast-feeding How should I use this medication? Take this medication by mouth. Take it as directed on the prescription label at the same time every day. Take it with food. Keep taking it unless your care team tells you to stop. Talk to your care team about the use of this medication in children. Special care may be needed. Overdosage: If you think you have taken too much of this medicine contact a poison control center or emergency room at once. NOTE: This medicine is only for you. Do not share this medicine with others. What if I miss a dose? If you miss a dose, take it as soon as you can. If it is almost time for your next dose, take only that dose. Do not take double or extra doses. What may interact with this medication? This medication may interact with the following: Certain medications for blood pressure, heart disease, irregular heartbeat Certain medications for depression, like fluoxetine or paroxetine Certain medications for diabetes, like glipizide or glyburide Cimetidine Clonidine Cyclosporine Digoxin MAOIs like Carbex, Eldepryl, Marplan, Nardil, and Parnate Reserpine Rifampin This list may not describe all possible interactions. Give your health care provider a list of all the medicines, herbs, non-prescription drugs, or dietary supplements you use. Also tell them if you smoke, drink alcohol, or use illegal drugs. Some items may interact with your medicine. What should I watch for while using this medication? Visit your care team for regular checks on your progress. Check your blood pressure as directed. Ask your care team what your blood pressure should be. Also, find out when you should contact them. Do not treat yourself for coughs, colds, or pain while you are using this medication without asking your care team for advice. Some medications may increase your blood pressure. You may get drowsy or dizzy. Do not drive, use  machinery, or do anything that needs mental alertness until you know how this medication affects you. Do not stand up or sit up quickly, especially if you are an older patient. This reduces the risk of dizzy or fainting spells. Alcohol may interfere with the effect of this medication. Avoid alcoholic drinks. This medication may increase blood sugar. Ask your care team if changes in diet or medications are needed if you have diabetes. If you are going to need surgery or another procedure, tell your care team that you are using this medication. What side effects may I notice from receiving this medication? Side effects that you should report to your care team as soon as possible: Allergic reactions-skin rash, itching, hives, swelling of the face, lips, tongue, or throat Heart failure-shortness of breath, swelling of the ankles, feet, or hands, sudden weight gain, unusual weakness or fatigue Low blood pressure-dizziness, feeling faint or lightheaded, blurry vision Raynaud's-cool, numb, or painful fingers or toes that may change color from pale, to blue, to red Slow  heartbeat-dizziness, feeling faint or lightheaded, confusion, trouble breathing, unusual weakness or fatigue Worsening mood, feelings of depression Side effects that usually do not require medical attention (report to your care team if they continue or are bothersome): Change in sex drive or performance Diarrhea Dizziness Fatigue Headache This list may not describe all possible side effects. Call your doctor for medical advice about side effects. You may report side effects to FDA at 1-800-FDA-1088. Where should I keep my medication? Keep out of the reach of children and pets. Store at room temperature between 20 and 25 degrees C (68 and 77 degrees F). Protect from moisture. Keep the container tightly closed. Throw away any unused medication after the expiration date. NOTE: This sheet is a summary. It may not cover all possible  information. If you have questions about this medicine, talk to your doctor, pharmacist, or health care provider.  2022 Elsevier/Gold Standard (2021-01-02 11:31:41)    REPATHA (Evolocumab) injection What is this medication? EVOLOCUMAB (e voe LOK ue mab) treats high cholesterol. It is used with lifestyle changes, like diet and exercise. It is used alone or with other medicines. This medicine may be used for other purposes; ask your health care provider or pharmacist if you have questions. COMMON BRAND NAME(S): Repatha What should I tell my care team before I take this medication? They need to know if you have any of these conditions: an unusual or allergic reaction to evolocumab, other medicines, latex, foods, dyes, or preservatives pregnant or trying to get pregnant breast-feeding How should I use this medication? This medicine is injected under the skin. You will be taught how to prepare and give it. Take it as directed on the prescription label at the same time every day. Keep taking it unless your health care provider tells you to stop. It is important that you put your used needles and syringes in a special sharps container. Do not put them in a trash can. If you do not have a sharps container, call your pharmacist or health care provider to get one. This medicine comes with INSTRUCTIONS FOR USE. Ask your pharmacist for directions on how to use this medicine. Read the information carefully. Talk to your pharmacist or health care provider if you have questions. Talk to your health care provider about the use of this medicine in children. While it may be prescribed for children as young as 10 for selected conditions, precautions do apply. Overdosage: If you think you have taken too much of this medicine contact a poison control center or emergency room at once. NOTE: This medicine is only for you. Do not share this medicine with others. What if I miss a dose? It is important not to miss any  doses. Talk to your health care provider about what to do if you miss a dose. What may interact with this medication? Interactions are not expected. This list may not describe all possible interactions. Give your health care provider a list of all the medicines, herbs, non-prescription drugs, or dietary supplements you use. Also tell them if you smoke, drink alcohol, or use illegal drugs. Some items may interact with your medicine. What should I watch for while using this medication? Visit your health care provider for regular checks on your progress. Tell your health care provider if your symptoms do not start to get better or if they get worse. You may need blood work while you are taking this drug. Do not wear the on-body infuser during an MRI. Taking this  drug is only part of a total heart healthy program. Your health care provider may give you a special diet to follow. Avoid alcohol. Avoid smoking. Ask your health care provider how much you should exercise. What side effects may I notice from receiving this medication? Side effects that you should report to your doctor or health care provider as soon as possible: allergic reactions (skin rash, itching or hives; swelling of the face, lips, or tongue) high blood sugar (increased hunger, thirst, or urination; unusually weak or tired, blurry vision) infection (fever, chills, cough, sore throat, pain, or trouble passing urine) Side effects that usually do not require medical attention (report to your doctor or health care provider if they continue or are bothersome): diarrhea muscle pain nausea pain, redness, or irritation at site where injected This list may not describe all possible side effects. Call your doctor for medical advice about side effects. You may report side effects to FDA at 1-800-FDA-1088. Where should I keep my medication? Keep out of the reach of children and pets. Store in a refrigerator or at room temperature between 20 and  25 degrees C (68 and 77 degrees F). Refrigeration (preferred): Store it in the refrigerator. Do not freeze. Keep it in the original carton until you are ready to take it. Remove the dose from the carton about 30 minutes before it is time for you to take it. Throw away any unused medicine after the expiration date. Room temperature: This medicine may be stored at room temperature for up to 30 days. Keep it in the original carton until you are ready to take it. If it is stored at room temperature, throw away any unused medicine after 30 days or after it expires, whichever is first. Protect from light. Do not shake. Avoid exposure to extreme heat. To get rid of medicines that are no longer needed or have expired: Take the medicine to a medicine take-back program. Check with your pharmacy or law enforcement to find a location. If you cannot return the medicine, ask your pharmacist or health care provider how to get rid of this medicine safely. NOTE: This sheet is a summary. It may not cover all possible information. If you have questions about this medicine, talk to your doctor, pharmacist, or health care provider.  2022 Elsevier/Gold Standard (2019-11-22 12:26:14)

## 2021-09-08 NOTE — Telephone Encounter (Signed)
The patient had a virtual visit with Marrianne Mood, PA today. Results reviewed with the patient at that time.

## 2021-09-08 NOTE — Progress Notes (Signed)
Virtual Visit via Video Note   This visit type was conducted due to national recommendations for restrictions regarding the COVID-19 Pandemic (e.g. social distancing) in an effort to limit this patient's exposure and mitigate transmission in our community.  Due to his co-morbid illnesses, this patient is at least at moderate risk for complications without adequate follow up.  This format is felt to be most appropriate for this patient at this time.  All issues noted in this document were discussed and addressed.  A limited physical exam was performed with this format.  Please refer to the patient's chart for his consent to telehealth for [facility].      Date:  09/08/2021   ID:  Evan [name], DOB 08-Jul-1966, MRN [id number]  Patient Location: Home Provider Location: Office/Clinic  PCP:  [name], NP  Cardiologist:  Candee Furbish, MD [facility] to be assigned Electrophysiologist:  None   Evaluation Performed:  Follow-Up Visit  Chief Complaint:  55 y.o. male with history of chest pain with LHC performed 2019 showing normal cors, concentric hypertrophy/normal EF by 2019 cath, hypertension, hyperlipidemia, family history of premature CAD, obesity, GERD, esophagitis, solitary kidney s/p left nephrectomy in 2010, intolerance to nitrates/Imdur due to headache, intolerance to ACE inhibitors due to cough, and seen today via virtual medicine for follow-up after recent stress testing.  Chief Complaint  Patient presents with   Follow-up    F/U after myoview-Patient would like to discuss billing code used for myoview because insurance denied payment.     History of Present Illness:    Evan [name] is a 55 y.o. male with PMH as above.  He has history of hypertension, hyperlipidemia, family history of premature CAD, obesity, GERD, esophagitis, solitary kidney s/p left nephrectomy in 2010, and is being seen today to establish with [facility].   He reports a strong  family history of CA and CAD. Specifically, he reports a mom that had triple bypass at age 44 and carotid artery dz. He has history of nephrectomy, hypertension, hospitalization for accelerating chest pain with cardiac catheterization showing no significant CAD, normal LVEDP, and here today for follow-up.    05/04/2018 echo performed for chest pain with mild concentric hypertrophy, EF 60 to 65%, NR WMA, G1 DD, trivial TR.    05/04/2018 LHC performed for symptoms worrisome for unstable angina with normal coronaries, LVEDP normal, EF greater than 65%, LVEDP normal.  Recommendation was to consider nonanginal chest pain.   11/2019 lower extremity Dopplers, performed for claudication-like symptoms from knees to feet after walking x1 year performed.  He reported feeling worse at night and that he had recently been experiencing the pain for the last 3 months with heaviness in both legs, as well as swelling.  Study showed no evidence of right or left lower extremity arterial disease.   Seen 04/17/2021 to establish with [facility].  He was referred to the clinic after seeing Dr. Mortimer Fries of pulmonology for daytime sleepiness and signs of sleep apnea and recommendation for sleep study at home.  At that time, he reported chest pain that was ongoing for at least 1 week.  When seen in clinic 04/17/2021, he reported feeling poorly for the past couple of weeks.  He had chest pain approximately 1 week prior to his pulmonologist appointment after feeling poorly before that time.  He then felt an initial burst or episode of intense chest pain that lasted a split-second felt like a heart squeeze that was intense  to the point of his feeling he may have blacked out.  He did not lose consciousness.  He reported chest pain that waxed and waned while sitting at his desk.  This persisted throughout the rest of the day.  He reported dyspnea with exertion.  He reported feeling funny in his lower jaw.  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  He noted abdominal distention and recent gallbladder removal.  He had intermittent dizziness, often reporting the room spinning.  He had night sweats.  He reported recently elevated CK with work-up per PCP.  He had a bump in his neck, attributed to spine issues.  He reported significant stressors, including a wife with leukemia.  He reported hope for medication changes moving forward, including metformin given his research on this medicine.  He was hopeful to discontinue spironolactone.  It was noted he did not tolerate lisinopril due to cough.  He had a history of migraines and did not feel he would tolerate Imdur.  He reported weight gain of 50 to 80 pounds in the last 2 years.  He had daytime fatigue with sleep apnea work-up ongoing.  SBP at home 120s to 130s and DBP 70 to 84.  He felt as if he was holding onto fluid.  Recommendation was for echo and Zio and possible medication changes at RTC if indicated.   Cardiac monitoring showed predominantly normal sinus rhythm.  Slight P wave morphology changes were noted.  1 episode of AV block (second-degree AV block Mobitz 2) occurred, lasting a total of 3 seconds.  Isolated ectopy also noted.   Echo showed EF 60 to 65%, NR WMA, mild LVH, G1 DD.  Seen in clinic 05/29/2021 and 9/9 with episodes of exertional chest and jaw pain for the last 1 and half weeks, followed by extreme fatigue.  He reported family stressors, including 2 brothers that passed away the same year and wife with cancer that may have returned.  Chest pain was not similar to that of his GERD.  He reported a new dry cough.  He reported his CPAP was backordered by 4 months, though he was sleeping better off of Benadryl.  His lower extremity edema had improved with lymphedema pumps that he bought online with left still worse than that of right.  He was taking his PRN lasix and potassium 2-3 times per week. His legs did not ache as much as in the past.  He  reported anxiety when on his right side. When he had his episodes of jaw pain, his SBP at home was 154-158 with DBP 84-85. He reported that BP usually 130/78-80. He usually drinks 1 cup of coffee.  He had an upcoming knee surgery with stress testing recommended prior to the procedure, given his chest pain. Wt 292 lbs at home.  Subsequent stress testing was low risk with knee surgery successfully then performed.  Today, 09/08/2021, he is seen via virtual medicine and reports he is overall doing well from a cardiac standpoint.  He reports that his knee surgery went well.  He continues to have sleep apnea with CPAP backordered.  His breathing has been stable.  He reports using stockings on his legs without any swelling.  His Lasix keeps the swelling at [place].  He reports some recurrent episodes of chest pain as described above but less intense.  BP at home 139/73 with heart rate 74.  He also notes a range of SBP 1 54-1 22 and DBP  73-93 after reviewing these pressures on his monitor.  Medication changes discussed with patient preference to first review these medication changes prior to any changes as outlined below.  No signs or symptoms of bleeding.  Does report some issues with the billing associated with the stress test with billing then subsequently reviewed with the office manager and stress test noted to be covered by his insurance.  The patient does not have symptoms concerning for COVID-19 infection (fever, chills, cough, or new shortness of breath).   Past Medical History:  Diagnosis Date   Arthritis    Chronic kidney disease    Colon polyps    benign per pt   Diverticulitis    Hyperlipidemia    Hypertension    Lyme disease    Seizures ([place])    Past Surgical History:  Procedure Laterality Date   CARDIAC CATHETERIZATION     CHOLECYSTECTOMY  2021   HERNIA REPAIR     x 2   LEFT HEART CATH AND CORONARY ANGIOGRAPHY N/A 05/04/2018   Procedure: LEFT HEART CATH AND CORONARY ANGIOGRAPHY;  Surgeon:  Leonie Man, MD;  Location: [facility];  Service: Cardiovascular;  Laterality: N/A;   MENISCUS REPAIR     NECK SURGERY     NEPHRECTOMY Left 2010   for atrophy and reflux   PROSTATE BIOPSY     SHOULDER SURGERY Left      Current Meds  Medication Sig   aspirin EC 81 MG tablet Take 81 mg by mouth 2 (two) times daily. Swallow whole.   betamethasone dipropionate 0.05 % cream Apply 1 application topically daily as needed.   furosemide (LASIX) 20 MG tablet Take 1 tablet (20mg ) AS NEEDED for wt gain of 3lbs overnight or 5 lbs in one week.   metFORMIN (GLUCOPHAGE-XR) 500 MG 24 hr tablet Take 1 tablet (500 mg total) by mouth daily with breakfast. For diabetes.   mupirocin ointment (BACTROBAN) 2 % Apply 1 application topically as needed.   olmesartan (BENICAR) 20 MG tablet Take 1 tablet (20 mg total) by mouth daily. For blood pressure.   omeprazole (PRILOSEC) 20 MG capsule Take 1 capsule (20 mg total) by mouth daily.   potassium chloride (KLOR-CON) 10 MEQ tablet Take 1 tablet (10 mEq total) by mouth as directed. Take one tablet only when take Lasix.   spironolactone (ALDACTONE) 25 MG tablet Take 1 tablet (25 mg total) by mouth daily. For blood pressure.     Allergies:   Nuvigil [armodafinil], Seroquel [quetiapine], and Simvastatin   Social History   Tobacco Use   Smoking status: Never   Smokeless tobacco: Never  Vaping Use   Vaping Use: Never used  Substance Use Topics   Alcohol use: Yes    Comment: occasional   Drug use: No     Family Hx: The patient's family history includes Arthritis in his maternal grandmother and mother; Bladder Cancer in his brother; Breast cancer in his father; Colon cancer in his father, maternal grandmother, and paternal uncle; Diabetes in his paternal grandfather and paternal uncle; Heart disease in his maternal grandfather, maternal grandmother, and mother; Hypertension in his mother; Kidney disease in his mother; Lung cancer in his brother, brother,  father, and paternal aunt; Prostate cancer in his father.  ROS:   Please see the history of present illness.    As outlined above All other systems reviewed and are negative.  Objective:    Vital Signs:  BP 139/75 Comment: taken 09/07/2021  Pulse 80  Ht 5\' 10"  (1.778 m)   Wt 294 lb (133.4 kg)   BMI 42.18 kg/m    VITAL SIGNS:  reviewed GEN:  no acute distress PSYCH:  normal affect  Accessory clinical findings reviewed:    EKG:  No ECG reviewed.  Previous vitals reviewed today:    Temp Readings from Last 3 Encounters:  04/10/21 (!) 97.3 F (36.3 C) (Temporal)  02/14/21 97.8 F (36.6 C) (Temporal)  11/06/20 97.9 F (36.6 C) (Oral)   BP Readings from Last 3 Encounters:  09/08/21 139/75  08/22/21 100/80  05/29/21 120/90   Pulse Readings from Last 3 Encounters:  09/08/21 80  08/22/21 79  05/29/21 81    Wt Readings from Last 3 Encounters:  09/08/21 294 lb (133.4 kg)  08/22/21 (!) 300 lb 8 oz (136.3 kg)  05/29/21 (!) 301 lb (136.5 kg)     Labs reviewed today:    [place] present? No  Lab Results  Component Value Date   WBC 9.2 02/14/2021   HGB 16.6 02/14/2021   HCT 48.6 02/14/2021   MCV 92.3 02/14/2021   PLT 262.0 02/14/2021   Lab Results  Component Value Date   CREATININE 0.97 08/28/2021   BUN 17 08/28/2021   NA 136 08/28/2021   K 3.9 08/28/2021   CL 99 08/28/2021   CO2 28 08/28/2021   Lab Results  Component Value Date   ALT 32 02/14/2021   AST 26 02/14/2021   ALKPHOS 45 02/14/2021   BILITOT 0.6 02/14/2021   Lab Results  Component Value Date   CHOL 243 (H) 02/14/2021   HDL 40.90 02/14/2021   LDLCALC 164 (H) 02/14/2021   LDLDIRECT 173.0 04/14/2017   TRIG 186.0 (H) 02/14/2021   CHOLHDL 6 02/14/2021    Lab Results  Component Value Date   HGBA1C 6.6 (H) 02/14/2021   Lab Results  Component Value Date   TSH 2.18 02/14/2021     Prior CV Studies reviewed today:    The following studies were reviewed  today:  MPI 08/28/2021 Pharmacological myocardial perfusion imaging study with no significant  ischemia Normal wall motion, EF estimated at 63% No EKG changes concerning for ischemia at peak stress or in recovery. CT attenuation correction images with no significant aortic atherosclerosis or coronary calcification Low risk scan   Monitor  05/14/21 Average heart rate 88 bpm, sinus rhythm. Episode of second-degree heart block type I phenomenon at 6:11 AM presumably during sleep with associated first-degree AV block. Benign. Increased vagal tone. No significant pauses No atrial fibrillation, no ventricular tachycardia Rare PVCs and PACs. Symptoms of cold, sweats, dizziness while watching TV in the early afternoon were associated with sinus rhythm heart rate 88 bpm. No adverse arrhythmias. Overall reassuring heart monitor. Patch Wear Time:  5 days and 9 hours (2022-05-18T08:10:57-0400 to 2022-05-23T17:48:31-0400) Patient had a min HR of 22 bpm, max HR of 127 bpm, and avg HR of 88 bpm. Predominant underlying rhythm was Sinus Rhythm. Slight P wave morphology changes were noted.  SVEs were rare (<1.0%), SVE Couplets were rare (<1.0%), and no SVE Triplets were present. Isolated VEs were rare (<1.0%), VE Couplets were rare (<1.0%), and no VE Triplets were present.   Echo 04/30/21  1. Left ventricular ejection fraction, by estimation, is 60 to 65%. The  left ventricle has normal function. The left ventricle has no regional  wall motion abnormalities. There is mild left ventricular hypertrophy.  Left ventricular diastolic parameters  are consistent with Grade I diastolic dysfunction (impaired relaxation).  2. Right ventricular systolic function is normal. The right ventricular  size is normal.   3. The mitral valve is normal in structure. No evidence of mitral valve  regurgitation.   4. The aortic valve is grossly normal. Aortic valve regurgitation is not  visualized.    US Arterial  study 11/2019 Summary:  Right: Resting right ankle-brachial index is within normal range. No  evidence of significant right lower extremity arterial disease. The right  toe-brachial index is normal.  Left: Resting left ankle-brachial index is within normal range. No  evidence of significant left lower extremity arterial disease. The left  toe-brachial index is normal.    LHC  04/2018 Angiographically normal coronary arteries. The left ventricular systolic function is normal -- The left ventricular ejection fraction is greater than 65% by visual estimate. LV end diastolic pressure is normal. Non-anginal Chest Pain. Plan:   TR Band removal in PACU Holding Area - d/c home after bedrest   Echo 04/2018 - Left ventricle: The cavity size was normal. There was mild    concentric hypertrophy. Systolic function was normal. The    estimated ejection fraction was in the range of 60% to 65%. Wall    motion was normal; there were no regional wall motion    abnormalities. Doppler parameters are consistent with abnormal    left ventricular relaxation (grade 1 diastolic dysfunction).    There was no evidence of elevated ventricular filling pressure by    Doppler parameters.  - Aortic valve: There was no regurgitation.  - Right ventricle: Systolic function was normal.  - Right atrium: The atrium was normal in size.  - Tricuspid valve: There was trivial regurgitation.  - Pulmonary arteries: Systolic pressure was within the normal    range.  - Inferior vena cava: The vessel was normal in size.  - Pericardium, extracardiac: There was no pericardial effusion.   ASSESSMENT & PLAN:    Ongoing exertional chest/jaw pain -- Reports ongoing chest pain/exertional jaw pain as previously reported, though reassured by recent echo and stress test.  Discussed symptoms as improved, and likely in the setting of overload.  Cardiac monitor without significant arrhythmia. 2019 cath showed normal cors.  RF  modification recommended, though he does not tolerate simvastatin due to hives.  We did discuss Repatha today.  Given HA history, he prefers to avoid nitrates.  Discussed addition of BB/CCB for antianginal effect and patient preference to first research this medication before initiation.  Medication changes recommended provided on AVS for patient to research.   Acute on chronic diastolic heart failure -- Reports improved symptoms with diuresis.  Continue current Lasix, spironolactone.  Reported he first wishes to research CCB/BB before addition to his medical regimen.  Continue home daily weights and BP checks.  Low-salt diet and fluid restrictions reviewed.     Hypertension, goal BP <130/80 -- BP suboptimal as reported on home pressures.  He will look into the recommended BB/CCB.  Continue Lasix and spironolactone. Continue home BP and wt monitoring. Discussed salt under 2g/day and fluid under 2L /day.    HLD --LDL 164 with Tg 186 and total cholesterol 243. He does not tolerate simvastatin per pt allergies. Could consider Crestor / Chauncey Cruel /[id number] / bempedoic acid / Vascepa / Incliseran for risk factor modification.  Discussed Repatha today.  He will research this medication and discuss at follow-up.   DM2 -- Most recent hemoglobin A1c 6.6.  Ongoing glycemic control recommended for risk factor modification.   GERD --Continue PPI.  No reported s/sx of bleeding.    Daytime somnolence / OSA sx ---CPAP on backorder. Tx of OSA recommended from a cardiovascular standpoint as discussed.  -----------------   Medication changes:  -- We will research the recommended amlodipine/carvedilol Repatha Labs ordered: None Studies / Imaging ordered: None Future considerations: cholesterol control, BB/CCB Disposition: RTC 4-6 months   *Please be aware that the above documentation was completed voice recognition software and may contain dictation errors.      COVID-19 Education: The signs and symptoms of  COVID-19 were discussed with the patient and how to seek care for testing (follow up with PCP or arrange E-visit).  The importance of social distancing was discussed today.  Time:   Today, I have spent 40 minutes with the patient with telehealth technology discussing the above problems.     Medication Adjustments/Labs and Tests Ordered: Current medicines are reviewed at length with the patient today.  Concerns regarding medicines are outlined above.    Signed, Arvil Chaco, PA-C  09/08/2021  [facility]

## 2021-09-10 ENCOUNTER — Other Ambulatory Visit: Payer: Self-pay

## 2021-09-10 ENCOUNTER — Other Ambulatory Visit: Payer: Self-pay | Admitting: Primary Care

## 2021-09-10 DIAGNOSIS — E119 Type 2 diabetes mellitus without complications: Secondary | ICD-10-CM

## 2021-09-10 NOTE — Telephone Encounter (Signed)
[facility] [address], however a 90-day supply was sent of metformin in [facility] [address] [place].  He also needs diabetes follow-up.  Please schedule.

## 2021-09-11 ENCOUNTER — Telehealth: Payer: Self-pay

## 2021-09-11 NOTE — Telephone Encounter (Signed)
Attempted to schedule.  LMOV to call office.  ° °

## 2021-09-11 NOTE — Telephone Encounter (Signed)
-----   Message from Emily Filbert, RN sent at 09/11/2021  9:25 AM EDT ----- Patient had a virtual visit with JV on 09/08/21- he needs to have an in person visit with a primary cardiologist in 4-6 months.  It really needs to be an MD ( he is switching from Dr. Marlou Porch in [place])  Thanks!

## 2021-09-15 NOTE — Telephone Encounter (Signed)
Called patient does not use that pharmacy anymore. I have also made follow up for this week. No refill needed at this time.

## 2021-09-18 ENCOUNTER — Ambulatory Visit: Payer: No Typology Code available for payment source | Admitting: Primary Care

## 2021-09-18 ENCOUNTER — Encounter: Payer: Self-pay | Admitting: Primary Care

## 2021-09-18 ENCOUNTER — Other Ambulatory Visit: Payer: Self-pay

## 2021-09-18 VITALS — BP 122/74 | HR 82 | Temp 97.9°F | Ht 70.0 in | Wt 299.0 lb

## 2021-09-18 DIAGNOSIS — R229 Localized swelling, mass and lump, unspecified: Secondary | ICD-10-CM | POA: Insufficient documentation

## 2021-09-18 DIAGNOSIS — E119 Type 2 diabetes mellitus without complications: Secondary | ICD-10-CM | POA: Diagnosis not present

## 2021-09-18 HISTORY — DX: Localized swelling, mass and lump, unspecified: R22.9

## 2021-09-18 LAB — POCT GLYCOSYLATED HEMOGLOBIN (HGB A1C): Hemoglobin A1C: 6.7 % — AB (ref 4.0–5.6)

## 2021-09-18 NOTE — Assessment & Plan Note (Signed)
A1C today of 6.7 which is controlled.  Continue metformin XR 500 mg daily.  Foot exam today. Eye exam UTD per patient. Managed on ARB. Cannot tolerate statin.  Follow up in 6 months

## 2021-09-18 NOTE — Progress Notes (Signed)
Subjective:    Patient ID: Evan [name], male    DOB: 12-08-1966, 55 y.o.   MRN: [id number]  HPI  Evan [name] is a very pleasant 55 y.o. male with a history of type 2 diabetes, hypertension, unstable angina, hyperlipidemia who presents today for follow up of diabetes. He would also like to discuss skin mass.  1) Type 2 Diabetes:  Current medications include: Metformin XR 500.  He is checking his blood glucose 0 times daily.   Last A1C: 6.6 in March 2022, 6.7 today Last Eye Exam: Completes annually, [facility]  Last Foot Exam: Due Pneumonia Vaccination: Declines  Urine Microalbumin: None. On ARB. Statin: None. Intolerant.   Dietary changes since last visit: None, endorses a fair diet. Large portion sizes.    Exercise: None.   2) Skin Mass: Chronic to the left lower posterior neck for which he initially noticed 7 months ago. He denies increased size in the mass but hasn't noticed decrease in size. He's noticed pressure to the site of the mass with a mild headache when laying down.  BP Readings from Last 3 Encounters:  09/18/21 122/74  09/08/21 139/75  08/22/21 100/80    Wt Readings from Last 3 Encounters:  09/18/21 299 lb (135.6 kg)  09/08/21 294 lb (133.4 kg)  08/22/21 (!) 300 lb 8 oz (136.3 kg)       Review of Systems  Eyes:  Positive for visual disturbance.       Follows with optometry   Respiratory:  Negative for shortness of breath.   Cardiovascular:  Negative for chest pain.  Neurological:  Positive for numbness.        Past Medical History:  Diagnosis Date   Arthritis    Chronic kidney disease    Colon polyps    benign per pt   Diverticulitis    Hyperlipidemia    Hypertension    Lyme disease    Seizures ([place])     Social History   Socioeconomic History   Marital status: Married    Spouse name: Not on file   Number of children: 4   Years of education: Not on file   Highest education level: Not on file  Occupational  History   Occupation: Freight forwarder  Tobacco Use   Smoking status: Never   Smokeless tobacco: Never  Vaping Use   Vaping Use: Never used  Substance and Sexual Activity   Alcohol use: Yes    Comment: occasional   Drug use: No   Sexual activity: Not on file  Other Topics Concern   Not on file  Social History Narrative   Not on file   Social Determinants of Health   Financial Resource Strain: Not on file  Food Insecurity: Not on file  Transportation Needs: Not on file  Physical Activity: Not on file  Stress: Not on file  Social Connections: Not on file  Intimate Partner Violence: Not on file    Past Surgical History:  Procedure Laterality Date   [place]     x 2   LEFT HEART CATH AND CORONARY ANGIOGRAPHY N/A 05/04/2018   Procedure: LEFT HEART CATH AND CORONARY ANGIOGRAPHY;  Surgeon: Leonie Man, MD;  Location: [facility];  Service: Cardiovascular;  Laterality: N/A;   MENISCUS REPAIR     NECK SURGERY     NEPHRECTOMY Left 2010   for atrophy and reflux   PROSTATE BIOPSY  SHOULDER SURGERY Left     Family History  Problem Relation Age of Onset   Arthritis Mother    Heart disease Mother    Hypertension Mother    Kidney disease Mother    Colon cancer Father        dx in his early 62's   Lung cancer Father    Prostate cancer Father    Breast cancer Father        50 or 14   Lung cancer Brother        smoker   Lung cancer Brother        smoker   Bladder Cancer Brother    Lung cancer Paternal Aunt    Colon cancer Paternal Uncle    Diabetes Paternal Uncle    Arthritis Maternal Grandmother    Colon cancer Maternal Grandmother    Heart disease Maternal Grandmother    Heart disease Maternal Grandfather    Diabetes Paternal Grandfather     Allergies  Allergen Reactions   Nuvigil [Armodafinil] Other (See Comments)    Seizures    Seroquel [Quetiapine] Other (See Comments)    Seizures.    Simvastatin Hives    Current Outpatient Medications on File Prior to Visit  Medication Sig Dispense Refill   aspirin EC 81 MG tablet Take 81 mg by mouth 2 (two) times daily. Swallow whole.     betamethasone dipropionate 0.05 % cream Apply 1 application topically daily as needed.     furosemide (LASIX) 20 MG tablet Take 1 tablet (20mg ) AS NEEDED for wt gain of 3lbs overnight or 5 lbs in one week. 30 tablet 0   metFORMIN (GLUCOPHAGE-XR) 500 MG 24 hr tablet Take 1 tablet (500 mg total) by mouth daily with breakfast. For diabetes. 90 tablet 0   mupirocin ointment (BACTROBAN) 2 % Apply 1 application topically as needed.     olmesartan (BENICAR) 20 MG tablet Take 1 tablet (20 mg total) by mouth daily. For blood pressure. 90 tablet 3   omeprazole (PRILOSEC) 20 MG capsule Take 1 capsule (20 mg total) by mouth daily. 60 capsule 3   potassium chloride (KLOR-CON) 10 MEQ tablet Take 1 tablet (10 mEq total) by mouth as directed. Take one tablet only when take Lasix. 30 tablet 0   spironolactone (ALDACTONE) 25 MG tablet Take 1 tablet (25 mg total) by mouth daily. For blood pressure. 90 tablet 3   No current facility-administered medications on file prior to visit.    BP 122/74 (BP Location: Left Arm, Patient Position: Sitting, Cuff Size: Large)   Pulse 82   Temp 97.9 F (36.6 C) (Temporal)   Ht 5\' 10"  (1.778 m)   Wt 299 lb (135.6 kg)   SpO2 95%   BMI 42.90 kg/m  Objective:   Physical Exam Cardiovascular:     Rate and Rhythm: Normal rate and regular rhythm.  Pulmonary:     Effort: Pulmonary effort is normal.     Breath sounds: Normal breath sounds. No wheezing or rales.  Musculoskeletal:     Cervical back: Neck supple.  Skin:    General: Skin is warm and dry.  Neurological:     Mental Status: He is alert and oriented to person, place, and time.          Assessment & Plan:      This visit occurred during the SARS-CoV-2 public health emergency.  Safety protocols were in place,  including screening questions prior to the visit, additional usage of staff PPE,  and extensive cleaning of exam room while observing appropriate contact time as indicated for disinfecting solutions.

## 2021-09-18 NOTE — Assessment & Plan Note (Signed)
Chronic.  Suspect lipoma/cyst.  Will obtain soft tissue ultrasound for verification. He agrees.  Ordered and pending.

## 2021-09-18 NOTE — Patient Instructions (Signed)
Continue taking Metformin XR 500 mg once daily for diabetes.  You will be contacted regarding your ultrasound.  Please let us know if you have not been contacted within two weeks.   Please schedule a physical to meet with me in 6 months.   It was a pleasure to see you today!

## 2021-09-19 ENCOUNTER — Telehealth: Payer: Self-pay | Admitting: Cardiology

## 2021-09-19 NOTE — Telephone Encounter (Signed)
-----   Message from Emily Filbert, RN sent at 09/19/2021  2:32 PM EDT ----- Just following up on this. Thanks! ----- Message ----- From: Emily Filbert, RN Sent: 09/11/2021   9:29 AM EDT To: Emily Filbert, RN, [facility] Scheduling  Patient had a virtual visit with JV on 09/08/21- he needs to have an in person visit with a primary cardiologist in 4-6 months.  It really needs to be an MD ( he is switching from Dr. Marlou Porch in [place])  Thanks!

## 2021-09-19 NOTE — Telephone Encounter (Signed)
Patient not ready to schedule at this time Would like to wait til he has his work Therapist, sports

## 2021-09-29 NOTE — Telephone Encounter (Signed)
Noted  

## 2021-09-29 NOTE — Telephone Encounter (Signed)
Patient not ready to schedule .  Recall in place .  Closing encounter.

## 2021-09-29 NOTE — Telephone Encounter (Addendum)
-----   Message from Emily Filbert, RN sent at 09/29/2021 12:22 PM EDT ----- Has anyone been able to try to reach out to him to schedule? Just checking! Thanks!

## 2021-10-01 ENCOUNTER — Ambulatory Visit
Admission: RE | Admit: 2021-10-01 | Discharge: 2021-10-01 | Disposition: A | Discharge status: Home / Self Care | Payer: No Typology Code available for payment source | Source: Ambulatory Visit | Attending: Primary Care | Admitting: Primary Care

## 2021-10-01 ENCOUNTER — Other Ambulatory Visit: Payer: Self-pay

## 2021-10-01 DIAGNOSIS — R229 Localized swelling, mass and lump, unspecified: Secondary | ICD-10-CM

## 2021-10-02 ENCOUNTER — Other Ambulatory Visit: Payer: Self-pay | Admitting: Primary Care

## 2021-11-18 ENCOUNTER — Telehealth: Payer: Self-pay

## 2021-11-18 NOTE — Telephone Encounter (Signed)
Called and spoke to patient about bringing in SD card to appointment, patient had a clear understanding. Patient also states he is going in for a procedure in  and needs to be there by 12:30. Nothing further needed.

## 2021-11-20 ENCOUNTER — Other Ambulatory Visit: Payer: Self-pay

## 2021-11-20 ENCOUNTER — Ambulatory Visit (INDEPENDENT_AMBULATORY_CARE_PROVIDER_SITE_OTHER): Payer: No Typology Code available for payment source | Admitting: Internal Medicine

## 2021-11-20 ENCOUNTER — Encounter: Payer: Self-pay | Admitting: Internal Medicine

## 2021-11-20 VITALS — BP 110/70 | HR 89 | Temp 98.1°F | Ht 70.5 in | Wt 305.6 lb

## 2021-11-20 DIAGNOSIS — G4733 Obstructive sleep apnea (adult) (pediatric): Secondary | ICD-10-CM | POA: Diagnosis not present

## 2021-11-20 DIAGNOSIS — E662 Morbid (severe) obesity with alveolar hypoventilation: Secondary | ICD-10-CM | POA: Diagnosis not present

## 2021-11-20 NOTE — Progress Notes (Signed)
Name: Tahje Borawski MRN: [id number] DOB: 11-24-1966     CONSULTATION DATE: 11/20/2021 REFERRING MD : Carlis Abbott  STUDIES:  11/06/20 CXR independently reviewed by Me today  ECHO 2774 Grade 1 Diastolic dysfunction    CHIEF COMPLAINT:  Establish care for severe sleep apnea     HISTORY OF PRESENT ILLNESS: July 2022 Patient diagnosed with severe sleep apnea AHI of 84 Initiate CPAP therapy  CPAP download reviewed in detail with patient AHI of 5 significantly reduced down to zero 1.8 Auto CPAP 5-13 And uses nasal pillows Started CPAP therapy 2 months ago Night sweats resolved More energy Less fatigue Patient feels so much better since starting CPAP therapy  Discussed sleep data and reviewed with patient.  Encouraged proper weight management.  Discussed driving precautions and its relationship with hypersomnolence.  Discussed operating dangerous equipment and its relationship with hypersomnolence.  Discussed sleep hygiene, and benefits of a fixed sleep waked time.  The importance of getting eight or more hours of sleep discussed with patient.  Discussed limiting the use of the computer and television before bedtime.  Decrease naps during the day, so night time sleep will become enhanced.  Limit caffeine, and sleep deprivation.  HTN, stroke, and heart failure are potential risk factors.   Gained 80 pounds in the last 2 years   PAST MEDICAL HISTORY :   has a past medical history of Arthritis, Chronic kidney disease, Colon polyps, Diverticulitis, Hyperlipidemia, Hypertension, Lyme disease, and Seizures ([place]).  has a past surgical history that includes Nephrectomy (Left, 2010); Neck surgery; Shoulder surgery (Left); Hernia repair; LEFT HEART CATH AND CORONARY ANGIOGRAPHY (N/A, 05/04/2018); Prostate biopsy; Cholecystectomy (2021); Cardiac catheterization; and Meniscus repair. Prior to Admission medications   Medication Sig Start Date End Date Taking? Authorizing Provider   betamethasone dipropionate (DIPROLENE) 0.05 % cream Apply 1 application topically daily.  02/16/18   [provider]  blood glucose meter kit and supplies KIT Dispense based on patient and insurance preference. Use up to four times daily as directed. (FOR ICD-9 250.00, 250.01). 02/14/21   [name], NP  Cholecalciferol (VITAMIN D3) 5000 units CAPS Take 1 capsule by mouth as directed.     [provider]  dicyclomine (BENTYL) 20 MG tablet 1 tablet 09/10/20   [provider]  diphenhydrAMINE (BENADRYL) 25 MG tablet Take 25 mg by mouth daily.    [provider]  fluticasone (FLONASE) 50 MCG/ACT nasal spray Place 1 spray into both nostrils 2 (two) times daily. 01/12/20   [name], NP  metFORMIN (GLUCOPHAGE-XR) 500 MG 24 hr tablet TAKE 1 TABLET (500 MG TOTAL) BY MOUTH DAILY WITH BREAKFAST. FOR DIABETES. 03/17/21   Lesleigh Noe, MD  metFORMIN (GLUCOPHAGE-XR) 500 MG 24 hr tablet Take 1 tablet (500 mg total) by mouth daily with breakfast. For diabetes. 03/12/21   [name], NP  mupirocin ointment (BACTROBAN) 2 % Apply 1 application topically daily. 06/07/20   [provider]  olmesartan (BENICAR) 20 MG tablet Take 1 tablet (20 mg total) by mouth daily. For blood pressure. 02/14/21   [name], NP  omeprazole (PRILOSEC) 20 MG capsule Take 1 capsule (20 mg total) by mouth daily. 05/04/18   Danford, Suann Larry, MD  spironolactone (ALDACTONE) 25 MG tablet Take 1 tablet (25 mg total) by mouth daily. For blood pressure. 02/27/21   [name], NP  Vitamin D, Ergocalciferol, (DRISDOL) 1.25 MG (50000 UNIT) CAPS capsule 1 capsule    [provider]  Allergies  Allergen Reactions   Nuvigil [Armodafinil] Other (See Comments)    Seizures    Seroquel [Quetiapine] Other (See Comments)    Seizures.   Simvastatin Hives     Review of Systems:  Gen:  Denies  fever, sweats, chills weight loss  HEENT: Denies blurred vision,  double vision, ear pain, eye pain, hearing loss, nose bleeds, sore throat Cardiac:  No dizziness, chest pain or heaviness, chest tightness,edema, No JVD Resp:   No cough, -sputum production, -shortness of breath,-wheezing, -hemoptysis,  Other:  All other systems negative  BP 110/70 (BP Location: Left Arm, Patient Position: Sitting, Cuff Size: Normal)   Pulse 89   Temp 98.1 F (36.7 C) (Oral)   Ht 5' 10.5" (1.791 m)   Wt (!) 305 lb 9.6 oz (138.6 kg)   SpO2 96%   BMI 43.23 kg/m   Physical Examination:   General Appearance: No distress  EYES PERRLA, EOM intact.   NECK Supple, No JVD Pulmonary: normal breath sounds, No wheezing.  CardiovascularNormal S1,S2.  No m/r/g.   ALL OTHER ROS ARE NEGATIVE     ASSESSMENT AND PLAN SYNOPSIS  55 year old pleasant white male seen today for follow-up assessment for severe sleep apnea with an AHI of 84    OSA severe AHI reduced to 1.8 previously 84 Patient use and benefits from CPAP therapy Continue as prescribed   Obesity -recommend significant weight loss -recommend changing diet  Deconditioned state -Recommend increased daily activity and exercise   MEDICATION ADJUSTMENTS/LABS AND TESTS ORDERED: Continue CPAP as prescribed  CURRENT MEDICATIONS REVIEWED AT LENGTH WITH PATIENT TODAY   Patient  satisfied with Plan of action and management. All questions answered  Follow-up in 1 year  Total time spent 21 minutes  Khallid Pasillas Patricia Pesa, M.D.  [facility]  Medical Director [place] Director [facility] Cardio-Pulmonary Department

## 2021-11-20 NOTE — Patient Instructions (Addendum)
  GREAT JOB A+!!!!!  Continue CPAP as prescribed   Recommend weight loss

## 2021-11-29 ENCOUNTER — Other Ambulatory Visit: Payer: Self-pay | Admitting: Primary Care

## 2021-11-29 DIAGNOSIS — E119 Type 2 diabetes mellitus without complications: Secondary | ICD-10-CM

## 2021-12-14 ENCOUNTER — Other Ambulatory Visit: Payer: Self-pay | Admitting: Primary Care

## 2021-12-14 DIAGNOSIS — I10 Essential (primary) hypertension: Secondary | ICD-10-CM

## 2021-12-25 ENCOUNTER — Ambulatory Visit: Payer: No Typology Code available for payment source | Admitting: Primary Care

## 2021-12-31 ENCOUNTER — Other Ambulatory Visit: Payer: Self-pay

## 2021-12-31 ENCOUNTER — Ambulatory Visit (INDEPENDENT_AMBULATORY_CARE_PROVIDER_SITE_OTHER): Payer: No Typology Code available for payment source | Admitting: Primary Care

## 2021-12-31 ENCOUNTER — Ambulatory Visit: Payer: No Typology Code available for payment source | Admitting: Primary Care

## 2021-12-31 VITALS — BP 122/88 | HR 81 | Temp 97.9°F | Ht 69.25 in | Wt 301.4 lb

## 2021-12-31 DIAGNOSIS — R3912 Poor urinary stream: Secondary | ICD-10-CM

## 2021-12-31 DIAGNOSIS — Z860100 Personal history of colon polyps, unspecified: Secondary | ICD-10-CM

## 2021-12-31 DIAGNOSIS — E1165 Type 2 diabetes mellitus with hyperglycemia: Secondary | ICD-10-CM

## 2021-12-31 DIAGNOSIS — E785 Hyperlipidemia, unspecified: Secondary | ICD-10-CM | POA: Diagnosis not present

## 2021-12-31 DIAGNOSIS — Z125 Encounter for screening for malignant neoplasm of prostate: Secondary | ICD-10-CM

## 2021-12-31 DIAGNOSIS — G4452 New daily persistent headache (NDPH): Secondary | ICD-10-CM

## 2021-12-31 DIAGNOSIS — I10 Essential (primary) hypertension: Secondary | ICD-10-CM | POA: Diagnosis not present

## 2021-12-31 DIAGNOSIS — M542 Cervicalgia: Secondary | ICD-10-CM

## 2021-12-31 DIAGNOSIS — R351 Nocturia: Secondary | ICD-10-CM

## 2021-12-31 DIAGNOSIS — G4733 Obstructive sleep apnea (adult) (pediatric): Secondary | ICD-10-CM

## 2021-12-31 DIAGNOSIS — Z0001 Encounter for general adult medical examination with abnormal findings: Secondary | ICD-10-CM

## 2021-12-31 DIAGNOSIS — E119 Type 2 diabetes mellitus without complications: Secondary | ICD-10-CM

## 2021-12-31 DIAGNOSIS — R229 Localized swelling, mass and lump, unspecified: Secondary | ICD-10-CM

## 2021-12-31 HISTORY — DX: Poor urinary stream: R39.12

## 2021-12-31 HISTORY — DX: New daily persistent headache (ndph): G44.52

## 2021-12-31 NOTE — Patient Instructions (Signed)
Stop by the lab prior to leaving today. I will notify you of your results once received.   Continue to work on [employer]. Start exercising when able.  You will be contacted regarding your CT scans.  Please let us know if you have not been contacted within two weeks.   Follow up with your eye doctor.  It was a pleasure to see you today!  Preventive Care 7-58 Years Old, Male Preventive care refers to lifestyle choices and visits with your health care provider that can promote health and wellness. Preventive care visits are also called wellness exams. What can I expect for my preventive care visit? Counseling During your preventive care visit, your health care provider may ask about your: Medical history, including: Past medical problems. Family medical history. Current health, including: Emotional well-being. Home life and relationship well-being. Sexual activity. Lifestyle, including: Alcohol, nicotine or tobacco, and drug use. Access to firearms. Diet, exercise, and sleep habits. Safety issues such as seatbelt and bike helmet use. Sunscreen use. Work and work Statistician. Physical exam Your health care provider will check your: Height and weight. These may be used to calculate your BMI (body mass index). BMI is a measurement that tells if you are at a healthy weight. Waist circumference. This measures the distance around your waistline. This measurement also tells if you are at a healthy weight and may help predict your risk of certain diseases, such as type 2 diabetes and high blood pressure. Heart rate and blood pressure. Body temperature. Skin for abnormal spots. What immunizations do I need? Vaccines are usually given at various ages, according to a schedule. Your health care provider will recommend vaccines for you based on your age, medical history, and lifestyle or other factors, such as travel or where you work. What tests do I need? Screening Your health care  provider may recommend screening tests for certain conditions. This may include: Lipid and cholesterol levels. Diabetes screening. This is done by checking your blood sugar (glucose) after you have not eaten for a while (fasting). Hepatitis B test. Hepatitis C test. HIV (human immunodeficiency virus) test. STI (sexually transmitted infection) testing, if you are at risk. Lung cancer screening. Prostate cancer screening. Colorectal cancer screening. Talk with your health care provider about your test results, treatment options, and if necessary, the need for more tests. Follow these instructions at home: Eating and drinking  Eat a diet that includes fresh fruits and vegetables, whole grains, lean protein, and low-fat dairy products. Take vitamin and mineral supplements as recommended by your health care provider. Do not drink alcohol if your health care provider tells you not to drink. If you drink alcohol: Limit how much you have to 0-2 drinks a day. Know how much alcohol is in your drink. In the [place] one drink equals one 12 oz bottle of beer (355 mL), one 5 oz glass of wine (148 mL), or one 1 oz glass of hard liquor (44 mL). Lifestyle Brush your teeth every morning and night with fluoride toothpaste. Floss one time each day. Exercise for at least 30 minutes 5 or more days each week. Do not use any products that contain nicotine or tobacco. These products include cigarettes, chewing tobacco, and vaping devices, such as e-cigarettes. If you need help quitting, ask your health care provider. Do not use drugs. If you are sexually active, practice safe sex. Use a condom or other form of protection to prevent STIs. Take aspirin only as told by your health care  provider. Make sure that you understand how much to take and what form to take. Work with your health care provider to find out whether it is safe and beneficial for you to take aspirin daily. Find healthy ways to manage stress, such  as: Meditation, yoga, or listening to music. Journaling. Talking to a trusted person. Spending time with friends and family. Minimize exposure to UV radiation to reduce your risk of skin cancer. Safety Always wear your seat belt while driving or riding in a vehicle. Do not drive: If you have been drinking alcohol. Do not ride with someone who has been drinking. When you are tired or distracted. While texting. If you have been using any mind-altering substances or drugs. Wear a helmet and other protective equipment during sports activities. If you have firearms in your house, make sure you follow all gun safety procedures. What's next? Go to your health care provider once a year for an annual wellness visit. Ask your health care provider how often you should have your eyes and teeth checked. Stay up to date on all vaccines. This information is not intended to replace advice given to you by your health care provider. Make sure you discuss any questions you have with your health care provider. Document Revised: 05/28/2021 Document Reviewed: 05/28/2021 Elsevier Patient Education  [place].

## 2021-12-31 NOTE — Assessment & Plan Note (Signed)
Declines Shingrix, influenza. Tetanus UTD per patient, I asked for him to provide me with a date.  PSA due and pending. Colonoscopy UTD, due 2026.  Discussed the importance of a healthy diet and regular exercise in order for weight loss, and to reduce the risk of further co-morbidity.  Exam today as noted. Labs pending.

## 2021-12-31 NOTE — Assessment & Plan Note (Signed)
Now on CPAP machine, follows with pulmonology.  Continue same.

## 2021-12-31 NOTE — Progress Notes (Signed)
Subjective:    Patient ID: Evan [name], male    DOB: 06/28/1966, 56 y.o.   MRN: [id number]  HPI  Evan [name] is a very pleasant 56 y.o. male who presents today for complete physical and follow up of chronic conditions.  He stopped his metformin three weeks ago due to symptoms of numbness to his feet, cold feet sensation that began three prior to stopping the metformin. Since his stopped metformin all symptoms have resolved.   He would also like to mention intermittent difficulty urinating, urinary frequency, urinary urgency, began about 3-4 months ago. History of prostate biopsy five years ago. He denies dysuria, penile discharge, hematuria. He does drink a lot of caffeine unsweet Green tea and water. He was once provided Flomax, never really took it.   He would also like to mention chronic skin mass. Evaluated in October 2022 for same. Underwent ultrasound which  did not show anything. He continues to notice pain with palpation, headaches (radiation to mid frontal lobe, between shoulder blades) for which he believes to be correlated, and daily blurred vision.Chronic blurred vision since having a plate placed into his neck from a surgery years prior. He is scheduled to see his ophthalmologist.   Immunizations: -Tetanus: UTD per patient -Influenza: Declines  -Covid-19: has not completed -Shingles: Never completed -Pneumonia: Declines   Diet: Fair diet.  Exercise: No regular exercise.  Eye exam: Completes annually  Dental exam: Completes semi-annually   Colonoscopy: Completed in 2021, due 2026 PSA: Due   BP Readings from Last 3 Encounters:  12/31/21 122/88  11/20/21 110/70  09/18/21 122/74   Wt Readings from Last 3 Encounters:  12/31/21 (!) 301 lb 7 oz (136.7 kg)  11/20/21 (!) 305 lb 9.6 oz (138.6 kg)  09/18/21 299 lb (135.6 kg)       Review of Systems  Constitutional:  Negative for unexpected weight change.  HENT:  Negative for rhinorrhea.   Eyes:  Positive for  visual disturbance.  Respiratory:  Negative for cough and shortness of breath.   Cardiovascular:  Negative for chest pain.  Gastrointestinal:  Negative for constipation and diarrhea.  Genitourinary:  Positive for difficulty urinating, frequency and urgency. Negative for dysuria and hematuria.  Musculoskeletal:  Positive for arthralgias.  Skin:  Negative for rash.  Allergic/Immunologic: Negative for environmental allergies.  Neurological:  Positive for headaches. Negative for dizziness.  Psychiatric/Behavioral:  The patient is not nervous/anxious.         Past Medical History:  Diagnosis Date   Arthritis    Chronic kidney disease    Colon polyps    benign per pt   Diverticulitis    Hyperlipidemia    Hypertension    Lyme disease    Seizures ([place])     Social History   Socioeconomic History   Marital status: Married    Spouse name: Not on file   Number of children: 4   Years of education: Not on file   Highest education level: Not on file  Occupational History   Occupation: Freight forwarder  Tobacco Use   Smoking status: Never   Smokeless tobacco: Never  Vaping Use   Vaping Use: Never used  Substance and Sexual Activity   Alcohol use: Yes    Comment: occasional   Drug use: No   Sexual activity: Not on file  Other Topics Concern   Not on file  Social History Narrative   Not on file   Social Determinants of Health   Financial Resource Strain: Not  on file  Food Insecurity: Not on file  Transportation Needs: Not on file  Physical Activity: Not on file  Stress: Not on file  Social Connections: Not on file  Intimate Partner Violence: Not on file    Past Surgical History:  Procedure Laterality Date   [place]     x 2   LEFT HEART CATH AND CORONARY ANGIOGRAPHY N/A 05/04/2018   Procedure: LEFT HEART CATH AND CORONARY ANGIOGRAPHY;  Surgeon: Leonie Man, MD;  Location: [facility];  Service:  Cardiovascular;  Laterality: N/A;   MENISCUS REPAIR     NECK SURGERY     NEPHRECTOMY Left 2010   for atrophy and reflux   PROSTATE BIOPSY     SHOULDER SURGERY Left     Family History  Problem Relation Age of Onset   Arthritis Mother    Heart disease Mother    Hypertension Mother    Kidney disease Mother    Colon cancer Father        dx in his early 6's   Lung cancer Father    Prostate cancer Father    Breast cancer Father        2 or 38   Lung cancer Brother        smoker   Lung cancer Brother        smoker   Bladder Cancer Brother    Lung cancer Paternal Aunt    Colon cancer Paternal Uncle    Diabetes Paternal Uncle    Arthritis Maternal Grandmother    Colon cancer Maternal Grandmother    Heart disease Maternal Grandmother    Heart disease Maternal Grandfather    Diabetes Paternal Grandfather     Allergies  Allergen Reactions   Nuvigil [Armodafinil] Other (See Comments)    Seizures    Seroquel [Quetiapine] Other (See Comments)    Seizures.   Simvastatin Hives    Current Outpatient Medications on File Prior to Visit  Medication Sig Dispense Refill   aspirin EC 81 MG tablet Take 81 mg by mouth 2 (two) times daily. Swallow whole.     betamethasone dipropionate 0.05 % cream Apply 1 application topically daily as needed.     furosemide (LASIX) 20 MG tablet Take 1 tablet (20mg ) AS NEEDED for wt gain of 3lbs overnight or 5 lbs in one week. 30 tablet 0   mupirocin ointment (BACTROBAN) 2 % Apply 1 application topically as needed.     olmesartan (BENICAR) 20 MG tablet TAKE 1 TABLET (20 MG TOTAL) BY MOUTH DAILY. FOR BLOOD PRESSURE. 90 tablet 0   omeprazole (PRILOSEC) 20 MG capsule Take 1 capsule (20 mg total) by mouth daily. 60 capsule 3   potassium chloride (KLOR-CON) 10 MEQ tablet Take 1 tablet (10 mEq total) by mouth as directed. Take one tablet only when take Lasix. 30 tablet 0   spironolactone (ALDACTONE) 25 MG tablet Take 1 tablet (25 mg total) by mouth daily.  For blood pressure. 90 tablet 3   metFORMIN (GLUCOPHAGE-XR) 500 MG 24 hr tablet TAKE 1 TABLET (500 MG TOTAL) BY MOUTH DAILY WITH BREAKFAST. FOR DIABETES. (Patient not taking: Reported on 12/31/2021) 90 tablet 0   No current facility-administered medications on file prior to visit.    BP 122/88    Pulse 81    Temp 97.9 F (36.6 C) (Temporal)    Ht 5' 9.25" (1.759 m)    Wt (!) 301  lb 7 oz (136.7 kg)    SpO2 97%    BMI 44.19 kg/m  Objective:   Physical Exam HENT:     Right Ear: Tympanic membrane and ear canal normal.     Left Ear: Tympanic membrane and ear canal normal.     Nose: Nose normal.     Right Sinus: No maxillary sinus tenderness or frontal sinus tenderness.     Left Sinus: No maxillary sinus tenderness or frontal sinus tenderness.  Eyes:     Conjunctiva/sclera: Conjunctivae normal.  Neck:     Thyroid: No thyromegaly.     Vascular: No carotid bruit.  Cardiovascular:     Rate and Rhythm: Normal rate and regular rhythm.     Heart sounds: Normal heart sounds.  Pulmonary:     Effort: Pulmonary effort is normal.     Breath sounds: Normal breath sounds. No wheezing or rales.  Abdominal:     General: Bowel sounds are normal.     Palpations: Abdomen is soft.     Tenderness: There is no abdominal tenderness.  Musculoskeletal:        General: Normal range of motion.     Cervical back: Neck supple.  Skin:    General: Skin is warm and dry.     Comments: No subcutaneous or deep tissue mass noted to cervical spine.  Neurological:     Mental Status: He is alert and oriented to person, place, and time.     Cranial Nerves: No cranial nerve deficit.     Deep Tendon Reflexes: Reflexes are normal and symmetric.  Psychiatric:        Mood and Affect: Mood normal.          Assessment & Plan:      This visit occurred during the SARS-CoV-2 public health emergency.  Safety protocols were in place, including screening questions prior to the visit, additional usage of staff PPE, and  extensive cleaning of exam room while observing appropriate contact time as indicated for disinfecting solutions.

## 2021-12-31 NOTE — Assessment & Plan Note (Signed)
Allergy to statins, hives.  Repeat lipid panel pending. Consider Zetia 10 mg.

## 2021-12-31 NOTE — Assessment & Plan Note (Signed)
Likely secondary to BPH.  PSA pending. Offered Flomax as he was once prescribed this in the past, he kindly declines but will notify if he changes his mind.

## 2021-12-31 NOTE — Assessment & Plan Note (Signed)
Negative soft tissue ultrasound.  Given no resolve, coupled with symptoms of blurred vision/headaches/pain, will order CT scan of C-spine and head.   Neuro exam today negative.

## 2021-12-31 NOTE — Assessment & Plan Note (Signed)
Colonoscopy UTD, due 2026

## 2021-12-31 NOTE — Assessment & Plan Note (Signed)
Continue olmesartan 20 mg, spironolactone 25 mg daily.   Continue furosemide 20 mg PRN and potassium chloride 10 mEq PRN.   CMP pending.

## 2021-12-31 NOTE — Assessment & Plan Note (Signed)
Prior surgery.  I do not feel the mass for which he describes.  Soft tissue ultrasound did not find anything.  Will order CT-C-spine.

## 2021-12-31 NOTE — Assessment & Plan Note (Signed)
Overall stable and improved since CPAP machine in place.  Offered Flomax 0.4 mg, he declines today but will notify if he changes his mind.

## 2021-12-31 NOTE — Assessment & Plan Note (Signed)
For last 3 months. Unclear if this is associated with the mass for which he notices. No mass on exam.   Given his age, coupled with symptoms of blurred vision, will obtain CT head and C-spine.  Orders placed.

## 2021-12-31 NOTE — Assessment & Plan Note (Addendum)
He discontinued his metformin 3 weeks ago without notifying us. Remain off for now.  Repeat A1C pending.   Managed on ARB and allergy to statins. Eye exam UTD. Declines pneumonia vaccine.   Consider Glipizide, however, he is motivated to work on weight loss.  Follow up in 3-6 months depending on A1C result.

## 2022-01-01 LAB — CBC
HCT: 46.8 % (ref 39.0–52.0)
Hemoglobin: 16.1 g/dL (ref 13.0–17.0)
MCHC: 34.3 g/dL (ref 30.0–36.0)
MCV: 92.1 fl (ref 78.0–100.0)
Platelets: 240 10*3/uL (ref 150.0–400.0)
RBC: 5.09 Mil/uL (ref 4.22–5.81)
RDW: 13.7 % (ref 11.5–15.5)
WBC: 8.4 10*3/uL (ref 4.0–10.5)

## 2022-01-01 LAB — LDL CHOLESTEROL, DIRECT: Direct LDL: 198 mg/dL

## 2022-01-01 LAB — LIPID PANEL
Cholesterol: 267 mg/dL — ABNORMAL HIGH (ref 0–200)
HDL: 39.2 mg/dL (ref >39.00)
NonHDL: 228.26
Total CHOL/HDL Ratio: 7
Triglycerides: 230 mg/dL — ABNORMAL HIGH (ref 0.0–149.0)
VLDL: 46 mg/dL — ABNORMAL HIGH (ref 0.0–40.0)

## 2022-01-01 LAB — COMPREHENSIVE METABOLIC PANEL
ALT: 38 U/L (ref 0–53)
AST: 28 U/L (ref 0–37)
Albumin: 4.3 g/dL (ref 3.5–5.2)
Alkaline Phosphatase: 49 U/L (ref 39–117)
BUN: 11 mg/dL (ref 6–23)
CO2: 29 mEq/L (ref 19–32)
Calcium: 9.6 mg/dL (ref 8.4–10.5)
Chloride: 100 mEq/L (ref 96–112)
Creatinine, Ser: 1.03 mg/dL (ref 0.40–1.50)
GFR: 80.64 mL/min (ref >60.00)
Glucose, Bld: 111 mg/dL — ABNORMAL HIGH (ref 70–99)
Potassium: 4 mEq/L (ref 3.5–5.1)
Sodium: 137 mEq/L (ref 135–145)
Total Bilirubin: 0.6 mg/dL (ref 0.2–1.2)
Total Protein: 7 g/dL (ref 6.0–8.3)

## 2022-01-01 LAB — HEMOGLOBIN A1C: Hgb A1c MFr Bld: 7.9 % — ABNORMAL HIGH (ref 4.6–6.5)

## 2022-01-01 LAB — PSA: PSA: 0.38 ng/mL (ref 0.10–4.00)

## 2022-01-13 ENCOUNTER — Ambulatory Visit
Admission: RE | Admit: 2022-01-13 | Discharge: 2022-01-13 | Disposition: A | Discharge status: Home / Self Care | Payer: No Typology Code available for payment source | Source: Ambulatory Visit | Attending: Primary Care | Admitting: Primary Care

## 2022-01-13 ENCOUNTER — Other Ambulatory Visit: Payer: Self-pay

## 2022-01-13 DIAGNOSIS — G4452 New daily persistent headache (NDPH): Secondary | ICD-10-CM | POA: Insufficient documentation

## 2022-01-13 DIAGNOSIS — M542 Cervicalgia: Secondary | ICD-10-CM | POA: Diagnosis present

## 2022-01-14 ENCOUNTER — Other Ambulatory Visit: Payer: Self-pay | Admitting: Primary Care

## 2022-01-14 DIAGNOSIS — G8929 Other chronic pain: Secondary | ICD-10-CM

## 2022-01-14 DIAGNOSIS — H538 Other visual disturbances: Secondary | ICD-10-CM

## 2022-01-14 DIAGNOSIS — M542 Cervicalgia: Secondary | ICD-10-CM

## 2022-01-14 DIAGNOSIS — G4452 New daily persistent headache (NDPH): Secondary | ICD-10-CM

## 2022-01-15 NOTE — Telephone Encounter (Signed)
I went in and edited the neurosurgery referral and placed the information the patient requested into the comment box

## 2022-01-16 ENCOUNTER — Emergency Department
Admission: EM | Admit: 2022-01-16 | Discharge: 2022-01-16 | Disposition: A | Discharge status: Home / Self Care | Payer: No Typology Code available for payment source | Attending: Emergency Medicine | Admitting: Emergency Medicine

## 2022-01-16 ENCOUNTER — Other Ambulatory Visit: Payer: Self-pay

## 2022-01-16 DIAGNOSIS — R42 Dizziness and giddiness: Secondary | ICD-10-CM | POA: Diagnosis present

## 2022-01-16 DIAGNOSIS — E1122 Type 2 diabetes mellitus with diabetic chronic kidney disease: Secondary | ICD-10-CM | POA: Diagnosis not present

## 2022-01-16 DIAGNOSIS — N189 Chronic kidney disease, unspecified: Secondary | ICD-10-CM | POA: Insufficient documentation

## 2022-01-16 DIAGNOSIS — I129 Hypertensive chronic kidney disease with stage 1 through stage 4 chronic kidney disease, or unspecified chronic kidney disease: Secondary | ICD-10-CM | POA: Insufficient documentation

## 2022-01-16 DIAGNOSIS — G4486 Cervicogenic headache: Secondary | ICD-10-CM | POA: Diagnosis not present

## 2022-01-16 DIAGNOSIS — Z86010 Personal history of colonic polyps: Secondary | ICD-10-CM | POA: Diagnosis not present

## 2022-01-16 LAB — BASIC METABOLIC PANEL
Anion gap: 9 (ref 5–15)
BUN: 11 mg/dL (ref 6–20)
CO2: 27 mmol/L (ref 22–32)
Calcium: 9.4 mg/dL (ref 8.9–10.3)
Chloride: 100 mmol/L (ref 98–111)
Creatinine, Ser: 1.06 mg/dL (ref 0.61–1.24)
GFR, Estimated: >60 mL/min (ref >60)
Glucose, Bld: 238 mg/dL — ABNORMAL HIGH (ref 70–99)
Potassium: 4.2 mmol/L (ref 3.5–5.1)
Sodium: 136 mmol/L (ref 135–145)

## 2022-01-16 LAB — CBC
HCT: 47.9 % (ref 39.0–52.0)
Hemoglobin: 16.7 g/dL (ref 13.0–17.0)
MCH: 31.5 pg (ref 26.0–34.0)
MCHC: 34.9 g/dL (ref 30.0–36.0)
MCV: 90.2 fL (ref 80.0–100.0)
Platelets: 254 10*3/uL (ref 150–400)
RBC: 5.31 MIL/uL (ref 4.22–5.81)
RDW: 12.9 % (ref 11.5–15.5)
WBC: 8.2 10*3/uL (ref 4.0–10.5)
nRBC: 0 % (ref 0.0–0.2)

## 2022-01-16 MED ORDER — METOCLOPRAMIDE HCL 10 MG PO TABS
10.0000 mg | ORAL_TABLET | Freq: Once | ORAL | Status: AC
  Administered 2022-01-16: 10 mg via ORAL
  Filled 2022-01-16: qty 1

## 2022-01-16 MED ORDER — LIDOCAINE 5 % EX PTCH
1.0000 | MEDICATED_PATCH | CUTANEOUS | Status: DC
  Administered 2022-01-16: 1 via TRANSDERMAL
  Filled 2022-01-16: qty 1

## 2022-01-16 MED ORDER — LIDOCAINE 5 % EX PTCH: 1.0000 | MEDICATED_PATCH | CUTANEOUS | 0 refills | Status: DC

## 2022-01-16 MED ORDER — IBUPROFEN 400 MG PO TABS
400.0000 mg | ORAL_TABLET | Freq: Once | ORAL | Status: AC
  Administered 2022-01-16: 400 mg via ORAL
  Filled 2022-01-16: qty 1

## 2022-01-16 NOTE — ED Notes (Signed)
Pt verbalized understanding of discharge instructions, follow-up care instructions, and prescription medications. Pt advised if any symptoms worsen  or come back to return to ED.

## 2022-01-16 NOTE — ED Provider Notes (Signed)
[facility] Provider Note    Event Date/Time   First MD Initiated Contact with Patient 01/16/22 1611     (approximate)   History   Dizziness and Nausea   HPI  Evan [name] is a 56 y.o. male with past medical history of CKD, hypertension hyperlipidemia presents with headache.  Patient tells me that since October he has been having pain in his neck radiating up to the back of his head.  He initially felt a lump in the back of his neck and had a soft tissue ultrasound done that was negative.  Has been following up with his primary care provider who then ordered a CT of his head and neck.  CT of the C-spine showed some chronic changes and he has been referred to neurosurgery.  He was told that if his headaches were worsening he should come to the emergency department.  Patient notes that over the last 2 weeks headaches have been worse and particularly worse over the last 2 days.  He endorses blurred vision especially at nighttime with seeing far.  He has had nausea but no vomiting.  Pain is worse at night.  Has tried some Excedrin intermittently which has not provided much relief.  He has some intermittent numbness in the left hand but otherwise no other neurologic symptoms.  Denies fevers chills.  No injury.    Past Medical History:  Diagnosis Date   Arthritis    Chronic kidney disease    Colon polyps    benign per pt   Diverticulitis    Hyperlipidemia    Hypertension    Lyme disease    Seizures ([place])     Patient Active Problem List   Diagnosis Date Noted   OSA (obstructive sleep apnea) 12/31/2021   Weak urinary stream 12/31/2021   New daily persistent headache 12/31/2021   Cervicalgia 12/31/2021   Localized skin mass, lump, or swelling 09/18/2021   Pain in joint of right knee 05/07/2021   Personal history of colonic polyps 02/14/2021   Nocturia 02/14/2021   Snoring 02/14/2021   Type 2 diabetes mellitus ([place]) 02/14/2021   Elevated CK 06/14/2020    Sore throat 06/14/2020   Seasonal allergies 01/12/2020   Chronic cough 01/05/2020   Encounter for annual general medical examination with abnormal findings in adult 11/02/2019   Vitamin D deficiency 09/04/2019   B12 deficiency 09/04/2019   Chronic fatigue 09/04/2019   Generalized abdominal pain 11/04/2018   Myalgia 11/04/2018   Unstable angina (HCC)    Chest pain, moderate coronary artery risk    History of nephrectomy 06/19/2017   Essential hypertension 04/14/2017   Hyperlipidemia 04/14/2017   Arthralgia 04/14/2017     Physical Exam  Triage Vital Signs: ED Triage Vitals  Enc Vitals Group     BP 01/16/22 1356 (!) 146/98     Pulse Rate 01/16/22 1356 99     Resp 01/16/22 1356 18     Temp 01/16/22 1356 97.9 F (36.6 C)     Temp Source 01/16/22 1356 Oral     SpO2 01/16/22 1356 95 %     Weight 01/16/22 1358 300 lb (136.1 kg)     Height 01/16/22 1358 5\' 9"  (1.753 m)     Head Circumference --      Peak Flow --      Pain Score 01/16/22 1358 8     Pain Loc --      Pain Edu? --      Excl.  in [place]? --     Most recent vital signs: Vitals:   01/16/22 1356 01/16/22 1634  BP: (!) 146/98 (!) 143/91  Pulse: 99 88  Resp: 18 18  Temp: 97.9 F (36.6 C)   SpO2: 95% 98%     General: Awake, no distress.  CV:  Good peripheral perfusion.  Resp:  Normal effort.  Abd:  No distention.  Neuro:             Awake, Alert, Oriented x 3  Other:  Aox3, nml speech  PERRL, EOMI, face symmetric, nml tongue movement  5/5 strength in the BL upper and lower extremities  Sensation grossly intact in the BL upper and lower extremities  Finger-nose-finger intact BL    ED Results / Procedures / Treatments  Labs (all labs ordered are listed, but only abnormal results are displayed) Labs Reviewed  BASIC METABOLIC PANEL - Abnormal; Notable for the following components:      Result Value   Glucose, Bld 238 (*)    All other components within normal limits  CBC      EKG     RADIOLOGY    PROCEDURES:    MEDICATIONS ORDERED IN ED: Medications  lidocaine (LIDODERM) 5 % 1 patch (1 patch Transdermal Patch Applied 01/16/22 1658)  ibuprofen (ADVIL) tablet 400 mg (400 mg Oral Given 01/16/22 1659)  metoCLOPramide (REGLAN) tablet 10 mg (10 mg Oral Given 01/16/22 1659)     IMPRESSION / MDM / ASSESSMENT AND PLAN / ED COURSE  I reviewed the triage vital signs and the nursing notes.                              Differential diagnosis includes, but is not limited to, cervicogenic headache, migraine headache, occipital neuralgia, brain mass  Patient is a 56 year old male who presents with neck pain and an occipital headache intermittently since October.  Has been following with his primary doctor primarily for neck pain and had a CT of his C-spine and head on 1/31, a CT of his head was unremarkable, CT C-spine with some chronic findings and he was referred to neurosurgery.  Pain has been worsening over the last 2 weeks in particular over the last 2 days.  He is associated blurred vision, no double vision, blurred vision is mostly at night and with far things.  Has had some nausea as well.  No other neurologic symptoms.  On exam today he appears well and his neurologic exam is intact.  I reviewed his labs which are notable for mild hyperglycemia to 238 but no signs of DKA, CBC otherwise within normal limits.  Given he is already had a CT of his head and not feel that additional imaging is necessary today especially because his neurologic exam is normal.  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  We will try Lidoderm patch Reglan and ibuprofen.  Would benefit from referral to neurology as may need to have nerve block or other more advanced therapies to get this under control.  Patient with some relief after Lidoderm patch and NSAIDs.  We discussed following up with neurology and potentially trying  acupuncture or massage.  He is stable for discharge.    FINAL CLINICAL IMPRESSION(S) / ED DIAGNOSES   Final diagnoses:  Cervicogenic headache     Rx / DC Orders   ED Discharge Orders  Ordered    lidocaine (LIDODERM) 5 %  Every 24 hours        01/16/22 1739             Note:  This document was prepared using Dragon voice recognition software and may include unintentional dictation errors.   Rada Hay, MD 01/16/22 1740

## 2022-01-16 NOTE — ED Notes (Addendum)
Pt has been c/o of headaches that have been happening for the past couple of days and has been getting worse over the course of two days. Pt is complaining of blurred vision at this time. Pt is also complaining of nausea as well.

## 2022-01-16 NOTE — ED Triage Notes (Signed)
Pt here with dizziness, blurred vision, and a headache. Pt went to his primary who performed a CT scan that showed issues with his cervical spine and was referred to a neurologist. Pt was told that if his symptoms become worse, to come to the ED. Pt in NAD in triage.

## 2022-02-03 ENCOUNTER — Other Ambulatory Visit: Payer: Self-pay | Admitting: Neurological Surgery

## 2022-02-03 DIAGNOSIS — M5412 Radiculopathy, cervical region: Secondary | ICD-10-CM

## 2022-02-06 ENCOUNTER — Ambulatory Visit
Admission: RE | Admit: 2022-02-06 | Discharge: 2022-02-06 | Disposition: A | Discharge status: Home / Self Care | Payer: No Typology Code available for payment source | Source: Ambulatory Visit | Attending: Neurological Surgery | Admitting: Neurological Surgery

## 2022-02-06 DIAGNOSIS — M5412 Radiculopathy, cervical region: Secondary | ICD-10-CM

## 2022-02-14 ENCOUNTER — Other Ambulatory Visit: Payer: Self-pay | Admitting: Primary Care

## 2022-02-14 DIAGNOSIS — I10 Essential (primary) hypertension: Secondary | ICD-10-CM

## 2022-02-21 ENCOUNTER — Other Ambulatory Visit: Payer: Self-pay | Admitting: Primary Care

## 2022-02-21 DIAGNOSIS — E119 Type 2 diabetes mellitus without complications: Secondary | ICD-10-CM

## 2022-03-14 ENCOUNTER — Other Ambulatory Visit: Payer: Self-pay | Admitting: Primary Care

## 2022-03-14 DIAGNOSIS — I10 Essential (primary) hypertension: Secondary | ICD-10-CM

## 2022-03-27 ENCOUNTER — Ambulatory Visit: Payer: No Typology Code available for payment source | Admitting: Primary Care

## 2022-04-08 DIAGNOSIS — K5900 Constipation, unspecified: Secondary | ICD-10-CM

## 2022-04-08 DIAGNOSIS — R1084 Generalized abdominal pain: Secondary | ICD-10-CM

## 2022-04-09 ENCOUNTER — Other Ambulatory Visit: Payer: Self-pay

## 2022-04-09 ENCOUNTER — Emergency Department (HOSPITAL_COMMUNITY): Payer: No Typology Code available for payment source

## 2022-04-09 ENCOUNTER — Emergency Department (HOSPITAL_COMMUNITY)
Admission: EM | Admit: 2022-04-09 | Discharge: 2022-04-09 | Disposition: A | Discharge status: Home / Self Care | Payer: No Typology Code available for payment source | Attending: Emergency Medicine | Admitting: Emergency Medicine

## 2022-04-09 ENCOUNTER — Encounter (HOSPITAL_COMMUNITY): Payer: Self-pay | Admitting: Emergency Medicine

## 2022-04-09 DIAGNOSIS — K5900 Constipation, unspecified: Secondary | ICD-10-CM | POA: Diagnosis not present

## 2022-04-09 DIAGNOSIS — R110 Nausea: Secondary | ICD-10-CM | POA: Insufficient documentation

## 2022-04-09 DIAGNOSIS — R1012 Left upper quadrant pain: Secondary | ICD-10-CM | POA: Diagnosis present

## 2022-04-09 DIAGNOSIS — Z7982 Long term (current) use of aspirin: Secondary | ICD-10-CM | POA: Diagnosis not present

## 2022-04-09 DIAGNOSIS — R109 Unspecified abdominal pain: Secondary | ICD-10-CM

## 2022-04-09 LAB — COMPREHENSIVE METABOLIC PANEL
ALT: 39 U/L (ref 0–44)
AST: 31 U/L (ref 15–41)
Albumin: 4.1 g/dL (ref 3.5–5.0)
Alkaline Phosphatase: 49 U/L (ref 38–126)
Anion gap: 6 (ref 5–15)
BUN: 10 mg/dL (ref 6–20)
CO2: 23 mmol/L (ref 22–32)
Calcium: 9.2 mg/dL (ref 8.9–10.3)
Chloride: 104 mmol/L (ref 98–111)
Creatinine, Ser: 1.03 mg/dL (ref 0.61–1.24)
GFR, Estimated: >60 mL/min (ref >60)
Glucose, Bld: 129 mg/dL — ABNORMAL HIGH (ref 70–99)
Potassium: 4 mmol/L (ref 3.5–5.1)
Sodium: 133 mmol/L — ABNORMAL LOW (ref 135–145)
Total Bilirubin: 1 mg/dL (ref 0.3–1.2)
Total Protein: 7.1 g/dL (ref 6.5–8.1)

## 2022-04-09 LAB — CBC
HCT: 47 % (ref 39.0–52.0)
Hemoglobin: 16.7 g/dL (ref 13.0–17.0)
MCH: 31.5 pg (ref 26.0–34.0)
MCHC: 35.5 g/dL (ref 30.0–36.0)
MCV: 88.5 fL (ref 80.0–100.0)
Platelets: 258 10*3/uL (ref 150–400)
RBC: 5.31 MIL/uL (ref 4.22–5.81)
RDW: 12.7 % (ref 11.5–15.5)
WBC: 7.8 10*3/uL (ref 4.0–10.5)
nRBC: 0 % (ref 0.0–0.2)

## 2022-04-09 LAB — LIPASE, BLOOD: Lipase: 31 U/L (ref 11–51)

## 2022-04-09 MED ORDER — SODIUM CHLORIDE 0.9 % IV BOLUS
1000.0000 mL | Freq: Once | INTRAVENOUS | Status: AC
Start: 2022-04-09 — End: 2022-04-09
  Administered 2022-04-09: 1000 mL via INTRAVENOUS

## 2022-04-09 MED ORDER — ONDANSETRON HCL 4 MG/2ML IJ SOLN
4.0000 mg | Freq: Once | INTRAMUSCULAR | Status: AC
  Administered 2022-04-09: 4 mg via INTRAVENOUS
  Filled 2022-04-09: qty 2

## 2022-04-09 MED ORDER — IOHEXOL 300 MG/ML  SOLN
100.0000 mL | Freq: Once | INTRAMUSCULAR | Status: AC | PRN
  Administered 2022-04-09: 100 mL via INTRAVENOUS

## 2022-04-09 NOTE — ED Triage Notes (Signed)
Pt reports issues w/ constipation x 4 months. Pt also reports lower abd pain, nausea x 3days.  ?

## 2022-04-09 NOTE — ED Provider Notes (Signed)
?[facility] DEPT ?Provider Note ? ? ?CSN: [id number] ?Arrival date & time: 04/09/22  6286 ? ?  ? ?History ? ?Chief Complaint  ?Patient presents with  ? Abdominal Pain  ? Constipation  ? ? ?Evan [name] is a 56 y.o. male. ? ?The history is provided by the patient.  ?Abdominal Pain ?Pain location:  LLQ and LUQ ?Pain quality: aching   ?Pain radiates to:  Does not radiate ?Pain severity:  Mild ?Onset quality:  Gradual ?Duration:  3 days ?Timing:  Constant ?Progression:  Worsening ?Chronicity:  New ?Context: previous surgery   ?Relieved by:  Nothing ?Worsened by:  Nothing ?Associated symptoms: constipation and nausea   ?Associated symptoms: no belching, no chest pain, no chills, no cough, no diarrhea, no dysuria, no fatigue, no fever, no flatus, no shortness of breath, no sore throat and no vomiting   ?Risk factors: multiple surgeries and NSAID use   ?Constipation ?Associated symptoms: abdominal pain and nausea   ?Associated symptoms: no diarrhea, no dysuria, no fever, no flatus and no vomiting   ? ?  ? ?Home Medications ?Prior to Admission medications   ?Medication Sig Start Date End Date Taking? Authorizing Provider  ?aspirin EC 81 MG tablet Take 81 mg by mouth 2 (two) times daily. Swallow whole.    [provider]  ?betamethasone dipropionate 0.05 % cream Apply 1 application topically daily as needed.    [provider]  ?furosemide (LASIX) 20 MG tablet Take 1 tablet ('20mg'$ ) AS NEEDED for wt gain of 3lbs overnight or 5 lbs in one week. 08/11/21   Marrianne Mood D, PA-C  ?lidocaine (LIDODERM) 5 % Place 1 patch onto the skin daily. Remove & Discard patch within 12 hours or as directed by MD 01/16/22   Rada Hay, MD  ?mupirocin ointment (BACTROBAN) 2 % Apply 1 application topically as needed.    [provider]  ?olmesartan (BENICAR) 20 MG tablet TAKE 1 TABLET (20 MG TOTAL) BY MOUTH DAILY. FOR BLOOD PRESSURE. 03/15/22   [name], NP  ?omeprazole  (PRILOSEC) 20 MG capsule Take 1 capsule (20 mg total) by mouth daily. 05/04/18   Danford, Suann Larry, MD  ?potassium chloride (KLOR-CON) 10 MEQ tablet Take 1 tablet (10 mEq total) by mouth as directed. Take one tablet only when take Lasix. 08/11/21   Marrianne Mood D, PA-C  ?spironolactone (ALDACTONE) 25 MG tablet TAKE 1 TABLET BY MOUTH EVERY DAY FOR BLOOD PRESSURE 02/15/22   [name], NP  ?   ? ?Allergies    ?Nuvigil [armodafinil], Seroquel [quetiapine], and Simvastatin   ? ?Review of Systems   ?Review of Systems  ?Constitutional:  Negative for chills, fatigue and fever.  ?HENT:  Negative for sore throat.   ?Respiratory:  Negative for cough and shortness of breath.   ?Cardiovascular:  Negative for chest pain.  ?Gastrointestinal:  Positive for abdominal pain, constipation and nausea. Negative for diarrhea, flatus and vomiting.  ?Genitourinary:  Negative for dysuria.  ? ?Physical Exam ?Updated Vital Signs ?BP 140/76   Pulse 65   Temp 98.3 ?F (36.8 ?C) (Oral)   Resp 16   SpO2 95%  ?Physical Exam ?Vitals and nursing note reviewed.  ?Constitutional:   ?   General: He is not in acute distress. ?   Appearance: He is well-developed. He is not ill-appearing.  ?HENT:  ?   Head: Normocephalic and atraumatic.  ?Eyes:  ?   Extraocular Movements: Extraocular movements intact.  ?   Conjunctiva/sclera: Conjunctivae  normal.  ?Cardiovascular:  ?   Rate and Rhythm: Normal rate and regular rhythm.  ?   Heart sounds: Normal heart sounds. No murmur heard. ?Pulmonary:  ?   Effort: Pulmonary effort is normal. No respiratory distress.  ?   Breath sounds: Normal breath sounds.  ?Abdominal:  ?   General: There is distension.  ?   Palpations: Abdomen is soft.  ?   Tenderness: There is abdominal tenderness in the left upper quadrant and left lower quadrant.  ?Musculoskeletal:     ?   General: No swelling.  ?   Cervical back: Neck supple.  ?Skin: ?   General: Skin is warm and dry.  ?   Capillary Refill: Capillary refill takes  less than 2 seconds.  ?Neurological:  ?   Mental Status: He is alert.  ?Psychiatric:     ?   Mood and Affect: Mood normal.  ? ? ?ED Results / Procedures / Treatments   ?Labs ?(all labs ordered are listed, but only abnormal results are displayed) ?Labs Reviewed  ?COMPREHENSIVE METABOLIC PANEL - Abnormal; Notable for the following components:  ?    Result Value  ? Sodium 133 (*)   ? Glucose, Bld 129 (*)   ? All other components within normal limits  ?LIPASE, BLOOD  ?CBC  ? ? ?EKG ?None ? ?Radiology ?CT ABDOMEN PELVIS W CONTRAST ? ?Result Date: 04/09/2022 ?CLINICAL DATA:  Abdominal pain and nausea x3 days, constipation EXAM: CT ABDOMEN AND PELVIS WITH CONTRAST TECHNIQUE: Multidetector CT imaging of the abdomen and pelvis was performed using the standard protocol following bolus administration of intravenous contrast. RADIATION DOSE REDUCTION: This exam was performed according to the departmental dose-optimization program which includes automated exposure control, adjustment of the mA and/or kV according to patient size and/or use of iterative reconstruction technique. CONTRAST:  168m OMNIPAQUE IOHEXOL 300 MG/ML  SOLN COMPARISON:  09/06/2020 FINDINGS: Lower chest: Crowding of markings in the posterior lower lung fields may suggest minimal subsegmental atelectasis. Hepatobiliary: Liver measures 17.7 cm. No focal abnormality is seen. Surgical clips are seen in gallbladder fossa. Pancreas: No focal abnormality is seen. Spleen: Spleen measures 12.5 cm in maximum diameter Adrenals/Urinary Tract: Adrenals are unremarkable. Left kidney is not seen suggesting possible previous left nephrectomy. Right kidney shows no hydronephrosis. There are no definite right renal or ureteral stones. Urinary bladder is unremarkable. Stomach/Bowel: Stomach is not distended. Small bowel loops are not dilated. Appendix is not dilated. There is no significant wall thickening in the colon. Multiple diverticula are seen in colon without signs of  focal diverticulitis. Vascular/Lymphatic: Minimal scattered arterial calcifications are seen. Reproductive: Unremarkable. Other: There is no ascites or pneumoperitoneum. Umbilical hernia containing fat is seen. There is possible small ventral hernia containing fat in the epigastrium. Musculoskeletal: There is disc space narrowing and minimal retrolisthesis at L4-L5 level. There is significant encroachment of neural foramina at L4-L5 level. IMPRESSION: There is no evidence of intestinal obstruction or pneumoperitoneum. Appendix is not dilated. There is no hydronephrosis in the right kidney. Status post left nephrectomy. Diverticulosis of colon without signs of diverticulitis. Lumbar spondylosis, particularly severe at L4-L5 level. Other findings as described in the body of the report. Electronically Signed   By: PElmer PickerM.D.   On: 04/09/2022 11:04   ? ?Procedures ?Procedures  ? ? ?Medications Ordered in ED ?Medications  ?sodium chloride 0.9 % bolus 1,000 mL (1,000 mLs Intravenous New Bag/Given 04/09/22 0950)  ?ondansetron ([facility] injection 4 mg (4 mg Intravenous Given 04/09/22 0951)  ?  iohexol (OMNIPAQUE) 300 MG/ML solution 100 mL (100 mLs Intravenous Contrast Given 04/09/22 1039)  ? ? ?ED Course/ Medical Decision Making/ A&P ?  ?                        ?Medical Decision Making ?Amount and/or Complexity of Data Reviewed ?Labs: ordered. ?Radiology: ordered. ? ?Risk ?Prescription drug management. ? ? ?Evan [name] is here with abdominal pain.  Normal vitals.  No fever.  Multiple abdominal surgeries in the past.  History of bowel obstructions and adhesions in the past.  States he has been constipated but he is concerned that he might have an obstruction.  He has not passed much gas today but was yesterday.  Started to feel nauseous but no vomiting.  Has had a lot of constipation however as well.  Differential diagnosis is likely bowel obstruction versus diverticulitis versus constipation.  We will get a CT  scan abdomen and pelvis.  We will check basic labs including CBC, CMP, lipase.  Will give IV fluids and IV Zofran and reevaluate. ? ?Per my review and interpretation of labs there is no significant anemia, electro

## 2022-04-09 NOTE — Discharge Instructions (Signed)
Recommend MiraLAX for constipation, gas pain.  Recommend using 1-2 capfuls in 30 ounces of water daily and titrate to effect.  Follow-up with your primary care doctor. ?

## 2022-04-13 NOTE — Telephone Encounter (Signed)
Attempted to schedule.  

## 2022-04-16 ENCOUNTER — Telehealth: Payer: Self-pay | Admitting: Primary Care

## 2022-04-16 NOTE — Telephone Encounter (Signed)
Faxed over ED notes and labs to Dr. Ulyses Amor office. ? ?Contacted the patient to inform him that the notes had been received and he could call their office to schedule ?

## 2022-04-16 NOTE — Telephone Encounter (Signed)
Pt called asking about his referral being faxed over to [facility]. He said he is frustrated and has stated that "he can hardly move my bowels (for past 5 weeks)." Pt would like an update on the status of this referral fax. Please advise ? ?Callback Number: [phone] ?

## 2022-04-21 ENCOUNTER — Other Ambulatory Visit: Payer: Self-pay | Admitting: Gastroenterology

## 2022-04-24 ENCOUNTER — Ambulatory Visit (HOSPITAL_COMMUNITY)
Admission: RE | Admit: 2022-04-24 | Discharge: 2022-04-24 | Disposition: A | Discharge status: Home / Self Care | Payer: No Typology Code available for payment source | Attending: Gastroenterology | Admitting: Gastroenterology

## 2022-04-24 ENCOUNTER — Ambulatory Visit (HOSPITAL_BASED_OUTPATIENT_CLINIC_OR_DEPARTMENT_OTHER): Payer: No Typology Code available for payment source | Admitting: Anesthesiology

## 2022-04-24 ENCOUNTER — Encounter (HOSPITAL_COMMUNITY): Payer: Self-pay | Admitting: Gastroenterology

## 2022-04-24 ENCOUNTER — Encounter (HOSPITAL_COMMUNITY)
Admission: RE | Disposition: A | Discharge status: Home / Self Care | Payer: Self-pay | Source: Home / Self Care | Attending: Gastroenterology

## 2022-04-24 ENCOUNTER — Other Ambulatory Visit: Payer: Self-pay

## 2022-04-24 ENCOUNTER — Ambulatory Visit (HOSPITAL_COMMUNITY): Payer: No Typology Code available for payment source | Admitting: Anesthesiology

## 2022-04-24 DIAGNOSIS — Z6841 Body Mass Index (BMI) 40.0 and over, adult: Secondary | ICD-10-CM | POA: Insufficient documentation

## 2022-04-24 DIAGNOSIS — Z86010 Personal history of colonic polyps: Secondary | ICD-10-CM | POA: Insufficient documentation

## 2022-04-24 DIAGNOSIS — E1122 Type 2 diabetes mellitus with diabetic chronic kidney disease: Secondary | ICD-10-CM | POA: Diagnosis not present

## 2022-04-24 DIAGNOSIS — M1990 Unspecified osteoarthritis, unspecified site: Secondary | ICD-10-CM | POA: Insufficient documentation

## 2022-04-24 DIAGNOSIS — G4730 Sleep apnea, unspecified: Secondary | ICD-10-CM | POA: Insufficient documentation

## 2022-04-24 DIAGNOSIS — K5520 Angiodysplasia of colon without hemorrhage: Secondary | ICD-10-CM | POA: Diagnosis not present

## 2022-04-24 DIAGNOSIS — K5730 Diverticulosis of large intestine without perforation or abscess without bleeding: Secondary | ICD-10-CM

## 2022-04-24 DIAGNOSIS — K635 Polyp of colon: Secondary | ICD-10-CM | POA: Diagnosis not present

## 2022-04-24 DIAGNOSIS — Z1211 Encounter for screening for malignant neoplasm of colon: Secondary | ICD-10-CM | POA: Diagnosis not present

## 2022-04-24 DIAGNOSIS — Z800 Family history of malignant neoplasm of digestive organs: Secondary | ICD-10-CM | POA: Insufficient documentation

## 2022-04-24 DIAGNOSIS — N189 Chronic kidney disease, unspecified: Secondary | ICD-10-CM | POA: Insufficient documentation

## 2022-04-24 DIAGNOSIS — D122 Benign neoplasm of ascending colon: Secondary | ICD-10-CM | POA: Insufficient documentation

## 2022-04-24 DIAGNOSIS — Z8249 Family history of ischemic heart disease and other diseases of the circulatory system: Secondary | ICD-10-CM | POA: Insufficient documentation

## 2022-04-24 DIAGNOSIS — Z833 Family history of diabetes mellitus: Secondary | ICD-10-CM | POA: Insufficient documentation

## 2022-04-24 DIAGNOSIS — I129 Hypertensive chronic kidney disease with stage 1 through stage 4 chronic kidney disease, or unspecified chronic kidney disease: Secondary | ICD-10-CM | POA: Insufficient documentation

## 2022-04-24 DIAGNOSIS — D120 Benign neoplasm of cecum: Secondary | ICD-10-CM | POA: Insufficient documentation

## 2022-04-24 HISTORY — PX: POLYPECTOMY: SHX5525

## 2022-04-24 HISTORY — PX: COLONOSCOPY WITH PROPOFOL: SHX5780

## 2022-04-24 LAB — GLUCOSE, CAPILLARY: Glucose-Capillary: 105 mg/dL — ABNORMAL HIGH (ref 70–99)

## 2022-04-24 SURGERY — COLONOSCOPY WITH PROPOFOL
Anesthesia: Monitor Anesthesia Care

## 2022-04-24 MED ORDER — LACTATED RINGERS IV SOLN
INTRAVENOUS | Status: DC
  Administered 2022-04-24: 1000 mL via INTRAVENOUS

## 2022-04-24 MED ORDER — SODIUM CHLORIDE 0.9 % IV SOLN: INTRAVENOUS | Status: DC

## 2022-04-24 MED ORDER — LIDOCAINE 2% (20 MG/ML) 5 ML SYRINGE
INTRAMUSCULAR | Status: DC | PRN
  Administered 2022-04-24: 100 mg via INTRAVENOUS

## 2022-04-24 MED ORDER — PROPOFOL 1000 MG/100ML IV EMUL
INTRAVENOUS | Status: AC
  Filled 2022-04-24: qty 100

## 2022-04-24 MED ORDER — PROPOFOL 10 MG/ML IV BOLUS
INTRAVENOUS | Status: DC | PRN
  Administered 2022-04-24: 50 mg via INTRAVENOUS

## 2022-04-24 MED ORDER — PROPOFOL 500 MG/50ML IV EMUL
INTRAVENOUS | Status: DC | PRN
  Administered 2022-04-24: 125 ug/kg/min via INTRAVENOUS

## 2022-04-24 SURGICAL SUPPLY — 22 items

## 2022-04-24 NOTE — Anesthesia Postprocedure Evaluation (Signed)
Anesthesia Post Note ? ?Patient: Evan [name] ? ?Procedure(s) Performed: COLONOSCOPY WITH PROPOFOL ?POLYPECTOMY ? ?  ? ?Patient location during evaluation: PACU ?Anesthesia Type: MAC ?Level of consciousness: awake and alert ?Pain management: pain level controlled ?Vital Signs Assessment: post-procedure vital signs reviewed and stable ?Respiratory status: spontaneous breathing and respiratory function stable ?Cardiovascular status: stable ?Postop Assessment: no apparent nausea or vomiting ?Anesthetic complications: no ? ? ?No notable events documented. ? ?Last Vitals:  ?Vitals:  ? 04/24/22 1130 04/24/22 1135  ?BP: 134/86   ?Pulse: 65 74  ?Resp: 12 17  ?Temp:    ?SpO2: 95% 94%  ?  ?Last Pain:  ?Vitals:  ? 04/24/22 1108  ?TempSrc: Temporal  ?PainSc: 0-No pain  ? ? ?  ?  ?  ?  ?  ?  ? ?Merlinda Frederick ? ? ? ? ?

## 2022-04-24 NOTE — H&P (Signed)
Evan [name] ?HPI: The patient reports having a change in his bowel habits that started 5-6 weeks ago.  In 2021 he had a lap chole for biliary dyskinesia.  This was diagnosed clinically as he has an anaphylactic type of reaction with the dye.  He felt well after the surgery and his RUQ pain resolved.  Occassionally he suffered with diarrhea post prandially, but it was manageable.  Starting a few weeks ago his symptoms significantly changed and he was not able to have a bowel movement.  He presented to the ER recently and the CT scan was negative for any abnormalities, i.e., obstruction.  In 2014 he had LOA for similar symptoms of left sided abdominal pain, but it was not associated with constipation.  This time he is constiapated and he feels that he cannot evacuate his rectum completely.  The use of leftover Linzess 145 mcg x 1, but it was not beneficial.  An over-the-counter laxative was also used without any benefit.  For an unknow reason he had a spontanous cathartic event, but he started to feel a recurrence of his constipation, bloating, and left sided abdominal pain.  He had a colonoscopy with Dr. Therisa Doyne in 2019 with findings of a couple of adenomas. ? ?Past Medical History:  ?Diagnosis Date  ? Arthritis   ? Chronic kidney disease   ? Colon polyps   ? benign per pt  ? Diverticulitis   ? Hyperlipidemia   ? Hypertension   ? Lyme disease   ? Seizures ([place])   ? ? ?Past Surgical History:  ?Procedure Laterality Date  ? CARDIAC CATHETERIZATION    ? CHOLECYSTECTOMY  2021  ? HERNIA REPAIR    ? x 2  ? LEFT HEART CATH AND CORONARY ANGIOGRAPHY N/A 05/04/2018  ? Procedure: LEFT HEART CATH AND CORONARY ANGIOGRAPHY;  Surgeon: Leonie Man, MD;  Location: [facility];  Service: Cardiovascular;  Laterality: N/A;  ? MENISCUS REPAIR    ? NECK SURGERY    ? NEPHRECTOMY Left 2010  ? for atrophy and reflux  ? PROSTATE BIOPSY    ? SHOULDER SURGERY Left   ? ? ?Family History  ?Problem Relation Age of Onset  ? Arthritis  Mother   ? Heart disease Mother   ? Hypertension Mother   ? Kidney disease Mother   ? Colon cancer Father   ?     dx in his early 60's  ? Lung cancer Father   ? Prostate cancer Father   ? Breast cancer Father   ?     48 or 1995  ? Lung cancer Brother   ?     smoker  ? Lung cancer Brother   ?     smoker  ? Bladder Cancer Brother   ? Lung cancer Paternal Aunt   ? Colon cancer Paternal Uncle   ? Diabetes Paternal Uncle   ? Arthritis Maternal Grandmother   ? Colon cancer Maternal Grandmother   ? Heart disease Maternal Grandmother   ? Heart disease Maternal Grandfather   ? Diabetes Paternal Grandfather   ? ? ?Social History:  reports that he has never smoked. He has never used smokeless tobacco. He reports current alcohol use. He reports that he does not use drugs. ? ?Allergies:  ?Allergies  ?Allergen Reactions  ? Nuvigil [Armodafinil] Other (See Comments)  ?  Seizures ?  ? Seroquel [Quetiapine] Other (See Comments)  ?  Seizures.  ? Simvastatin Hives  ? ? ?Medications: Scheduled: ?Continuous: ?  sodium chloride    ? lactated ringers 1,000 mL (04/24/22 0955)  ? ? ?Results for orders placed or performed during the hospital encounter of 04/24/22 (from the past 24 hour(s))  ?Glucose, capillary     Status: Abnormal  ? Collection Time: 04/24/22  9:53 AM  ?Result Value Ref Range  ? Glucose-Capillary 105 (H) 70 - 99 mg/dL  ?  ? ?No results found. ? ?ROS:  As stated above in the HPI otherwise negative. ? ?Blood pressure (!) 142/82, pulse 83, temperature 97.7 ?F (36.5 ?C), temperature source Tympanic, resp. rate 20, height '5\' 9"'$  (1.753 m), weight (!) 136.1 kg, SpO2 94 %.   ? ?PE: ?Gen: NAD, Alert and Oriented ?HEENT:  [place]/AT, EOMI ?Neck: Supple, no LAD ?Lungs: CTA Bilaterally ?CV: RRR without M/G/R ?ABD: Soft, NTND, +BS ?Ext: No C/C/E ? ?Assessment/Plan: ?1) Screening colonoscopy. ? ?Dane Kopke D ?04/24/2022, 10:23 AM  ? ? ?  ? ?

## 2022-04-24 NOTE — Anesthesia Preprocedure Evaluation (Addendum)
Anesthesia Evaluation  ?Patient identified by MRN, date of birth, ID band ?Patient awake ? ? ? ?Reviewed: ?Allergy & Precautions, NPO status , Patient's Chart, lab work & pertinent test results ? ?Airway ?Mallampati: III ? ?TM Distance: >3 FB ?Neck ROM: Full ? ? ? Dental ?no notable dental hx. ? ?  ?Pulmonary ?sleep apnea ,  ?  ?Pulmonary exam normal ?breath sounds clear to auscultation ? ? ? ? ? ? Cardiovascular ?hypertension, negative cardio ROS ?Normal cardiovascular exam ?Rhythm:Regular Rate:Normal ? ? ?  ?Neuro/Psych ? Headaches, Seizures -,  negative psych ROS  ? GI/Hepatic ?negative GI ROS, Neg liver ROS,   ?Endo/Other  ?diabetes, Well Controlled, Type 2Morbid obesity ? Renal/GU ?Renal disease  ?negative genitourinary ?  ?Musculoskeletal ? ?(+) Arthritis , Osteoarthritis,   ? Abdominal ?  ?Peds ?negative pediatric ROS ?(+)  Hematology ?negative hematology ROS ?(+)   ?Anesthesia Other Findings ? ? Reproductive/Obstetrics ?negative OB ROS ? ?  ? ? ? ? ? ? ? ? ? ? ? ? ? ?  ?  ? ? ? ? ? ? ? ?Anesthesia Physical ?Anesthesia Plan ? ?ASA: 3 ? ?Anesthesia Plan: MAC  ? ?Post-op Pain Management: Minimal or no pain anticipated  ? ?Induction: Intravenous ? ?PONV Risk Score and Plan: 1 and Propofol infusion and Treatment may vary due to age or medical condition ? ?Airway Management Planned: Natural Airway and Simple Face Mask ? ?Additional Equipment: None ? ?Intra-op Plan:  ? ?Post-operative Plan: Extubation in OR ? ?Informed Consent: I have reviewed the patients History and Physical, chart, labs and discussed the procedure including the risks, benefits and alternatives for the proposed anesthesia with the patient or authorized representative who has indicated his/her understanding and acceptance.  ? ? ? ? ? ?Plan Discussed with: Anesthesiologist and CRNA ? ?Anesthesia Plan Comments:   ? ? ? ? ? ?Anesthesia Quick Evaluation ? ?

## 2022-04-24 NOTE — Transfer of Care (Signed)
Immediate Anesthesia Transfer of Care Note ? ?Patient: Evan [name] ? ?Procedure(s) Performed: COLONOSCOPY WITH PROPOFOL ?POLYPECTOMY ? ?Patient Location: PACU ? ?Anesthesia Type:MAC ? ?Level of Consciousness: awake, alert  and oriented ? ?Airway & Oxygen Therapy: Patient Spontanous Breathing and Patient connected to face mask oxygen ? ?Post-op Assessment: Report given to RN and Post -op Vital signs reviewed and stable ? ?Post vital signs: Reviewed and stable ? ?Last Vitals:  ?Vitals Value Taken Time  ?BP    ?Temp    ?Pulse 85 04/24/22 1108  ?Resp 14 04/24/22 1108  ?SpO2 97 % 04/24/22 1108  ?Vitals shown include unvalidated device data. ? ?Last Pain:  ?Vitals:  ? 04/24/22 0933  ?TempSrc: Tympanic  ?PainSc: 0-No pain  ?   ? ?  ? ?Complications: No notable events documented. ?

## 2022-04-24 NOTE — Op Note (Signed)
[facility] ?Patient Name: Evan [name] ?Procedure Date: 04/24/2022 ?MRN: [id number] ?Attending MD: Carol Ada , MD ?Date of Birth: 1966-10-24 ?CSN: [id number] ?Age: 56 ?Admit Type: Outpatient ?Procedure:                Colonoscopy ?Indications:              Screening for colorectal malignant neoplasm ?Providers:                Carol Ada, MD, Dulcy Fanny, Janie Billups,  ?                          Technician, [employer], CRNA ?Referring MD:              ?Medicines:                 ?Complications:            No immediate complications. ?Estimated Blood Loss:     Estimated blood loss: none. ?Procedure:                Pre-Anesthesia Assessment: ?                          - Prior to the procedure, a History and Physical  ?                          was performed, and patient medications and  ?                          allergies were reviewed. The patient's tolerance of  ?                          previous anesthesia was also reviewed. The risks  ?                          and benefits of the procedure and the sedation  ?                          options and risks were discussed with the patient.  ?                          All questions were answered, and informed consent  ?                          was obtained. Prior Anticoagulants: The patient has  ?                          taken no previous anticoagulant or antiplatelet  ?                          agents. ASA Grade Assessment: III - A patient with  ?                          severe systemic disease. After reviewing the risks  ?  and benefits, the patient was deemed in  ?                          satisfactory condition to undergo the procedure. ?                          - Sedation was administered by an anesthesia  ?                          professional. Deep sedation was attained. ?                          After obtaining informed consent, the colonoscope  ?                          was passed under  direct vision. Throughout the  ?                          procedure, the patient's blood pressure, pulse, and  ?                          oxygen saturations were monitored continuously. The  ?                          CF-HQ190L ([id number]) Olympus colonoscope was  ?                          introduced through the anus and advanced to the the  ?                          cecum, identified by appendiceal orifice and  ?                          ileocecal valve. The colonoscopy was performed with  ?                          moderate difficulty due to significant looping.  ?                          Successful completion of the procedure was aided by  ?                          using manual pressure and straightening and  ?                          shortening the scope to obtain bowel loop  ?                          reduction. The patient tolerated the procedure  ?                          well. The quality of the bowel preparation was  ?  evaluated using the BBPS [facility] Bowel Preparation  ?                          Scale) with scores of: Right Colon = 3 (entire  ?                          mucosa seen well with no residual staining, small  ?                          fragments of stool or opaque liquid), Transverse  ?                          Colon = 3 (entire mucosa seen well with no residual  ?                          staining, small fragments of stool or opaque  ?                          liquid) and Left Colon = 3 (entire mucosa seen well  ?                          with no residual staining, small fragments of stool  ?                          or opaque liquid). The total BBPS score equals 9.  ?                          The quality of the bowel preparation was good. The  ?                          ileocecal valve, appendiceal orifice, and rectum  ?                          were photographed. ?Scope In: 10:34:04 AM ?Scope Out: 10:59:50 AM ?Scope Withdrawal Time: 0 hours 12 minutes 22 seconds   ?Total Procedure Duration: 0 hours 25 minutes 46 seconds  ?Findings: ?     Two sessile polyps were found in the ascending colon and cecum. The  ?     polyps were 9 to 10 mm in size. These polyps were removed with a cold  ?     snare. Resection and retrieval were complete. ?     A single small localized angiodysplastic lesion without bleeding was  ?     found in the transverse colon. ?     Scattered small and large-mouthed diverticula were found in the sigmoid  ?     colon. ?Impression:               - Two 9 to 10 mm polyps in the ascending colon and  ?                          in the cecum, removed with a cold snare. Resected  ?  and retrieved. ?                          - A single non-bleeding colonic angiodysplastic  ?                          lesion. ?                          - Diverticulosis in the sigmoid colon. ?Moderate Sedation: ?     Not Applicable - Patient had care per Anesthesia. ?Recommendation:           - Patient has a contact number available for  ?                          emergencies. The signs and symptoms of potential  ?                          delayed complications were discussed with the  ?                          patient. Return to normal activities tomorrow.  ?                          Written discharge instructions were provided to the  ?                          patient. ?                          - Resume previous diet. ?                          - Continue present medications. ?                          - Await pathology results. ?                          - Repeat colonoscopy in 3 years for surveillance. ?Procedure Code(s):        --- Professional --- ?                          [phone], Colonoscopy, flexible; with removal of  ?                          tumor(s), polyp(s), or other lesion(s) by snare  ?                          technique ?Diagnosis Code(s):        --- Professional --- ?                          Z12.11, Encounter for screening for malignant  ?                           neoplasm of colon ?  K63.5, Polyp of colon ?                          K55.20, Angiodysplasia of colon without hemorrhage ?                          K57.30, Diverticulosis of large intestine without  ?                          perforation or abscess without bleeding ?CPT copyright 2019 [employer] Medical Association. All rights reserved. ?The codes documented in this report are preliminary and upon coder review may  ?be revised to meet current compliance requirements. ?Carol Ada, MD ?Carol Ada, MD ?04/24/2022 11:04:45 AM ?This report has been signed electronically. ?Number of Addenda: 0 ?

## 2022-04-27 ENCOUNTER — Encounter (HOSPITAL_COMMUNITY): Payer: Self-pay | Admitting: Gastroenterology

## 2022-04-27 LAB — SURGICAL PATHOLOGY

## 2022-04-28 NOTE — Telephone Encounter (Signed)
Pt seen 04/20/22 with Dr Benson Norway.  ? ?Colonoscopy done as well.  ? ?Nothing further needed.  ? ?

## 2022-05-13 NOTE — Telephone Encounter (Signed)
Attempted to schedule.  Lmov .  Closing encounter after multiple attempts and deleting recall. ( Recall under agbor etang)

## 2022-06-03 ENCOUNTER — Encounter: Payer: Self-pay | Admitting: Physician Assistant

## 2022-06-03 ENCOUNTER — Ambulatory Visit: Payer: No Typology Code available for payment source | Admitting: Physician Assistant

## 2022-06-03 VITALS — BP 118/79 | HR 79 | Temp 98.3°F | Ht 69.0 in | Wt 294.4 lb

## 2022-06-03 DIAGNOSIS — L03012 Cellulitis of left finger: Secondary | ICD-10-CM | POA: Diagnosis not present

## 2022-06-03 DIAGNOSIS — E1165 Type 2 diabetes mellitus with hyperglycemia: Secondary | ICD-10-CM

## 2022-06-03 DIAGNOSIS — Z23 Encounter for immunization: Secondary | ICD-10-CM

## 2022-06-03 LAB — POCT GLYCOSYLATED HEMOGLOBIN (HGB A1C): Hemoglobin A1C: 6.7 % — AB (ref 4.0–5.6)

## 2022-06-03 MED ORDER — AMOXICILLIN-POT CLAVULANATE 875-125 MG PO TABS: 1.0000 | ORAL_TABLET | Freq: Two times a day (BID) | ORAL | 0 refills | Status: AC

## 2022-06-03 NOTE — Progress Notes (Signed)
Subjective:    Patient ID: Evan [name], male    DOB: 1966-06-13, 56 y.o.   MRN: [id number]  Chief Complaint  Patient presents with   finger infection    Pt c/o possible finger infection, looks better today per pt, not as red and swollen, was draining blood tinged pus, been using triple antibiotic ointment; been infected a little over two weeks. Pt received Tetanus vaccine and updated [facility]    HPI Patient is in today for L index finger ?infection x 2 weeks, worsening, but then it drained on its own a few days ago and looked better, starting to look like it needs to be drained again. Keeping it wrapped with bandaid and antibiotic ointment. No fevers or chills. No redness streaking up arm.   T2DM -  Hasn't been taking Metformin since December, started to having cold / itchy legs, was concerned about blood vessels and side effect from the Metformin after researching, so stopped taking it. He has been working on diet / exercise.   Past Medical History:  Diagnosis Date   Arthritis    Chronic kidney disease    Colon polyps    benign per pt   Diverticulitis    Hyperlipidemia    Hypertension    Lyme disease    Seizures ([place])     Past Surgical History:  Procedure Laterality Date   CARDIAC CATHETERIZATION     CHOLECYSTECTOMY  2021   COLONOSCOPY WITH PROPOFOL N/A 04/24/2022   Procedure: COLONOSCOPY WITH PROPOFOL;  Surgeon: Carol Ada, MD;  Location: [facility];  Service: Gastroenterology;  Laterality: N/A;   HERNIA REPAIR     x 2   LEFT HEART CATH AND CORONARY ANGIOGRAPHY N/A 05/04/2018   Procedure: LEFT HEART CATH AND CORONARY ANGIOGRAPHY;  Surgeon: Leonie Man, MD;  Location: [facility];  Service: Cardiovascular;  Laterality: N/A;   MENISCUS REPAIR     NECK SURGERY     NEPHRECTOMY Left 2010   for atrophy and reflux   POLYPECTOMY  04/24/2022   Procedure: POLYPECTOMY;  Surgeon: Carol Ada, MD;  Location: [facility];  Service: Gastroenterology;;   PROSTATE  BIOPSY     SHOULDER SURGERY Left     Family History  Problem Relation Age of Onset   Arthritis Mother    Heart disease Mother    Hypertension Mother    Kidney disease Mother    Colon cancer Father        dx in his early 66's   Lung cancer Father    Prostate cancer Father    Breast cancer Father        50 or 1995   Lung cancer Brother        smoker   Lung cancer Brother        smoker   Bladder Cancer Brother    Lung cancer Paternal Aunt    Colon cancer Paternal Uncle    Diabetes Paternal Uncle    Arthritis Maternal Grandmother    Colon cancer Maternal Grandmother    Heart disease Maternal Grandmother    Heart disease Maternal Grandfather    Diabetes Paternal Grandfather     Social History   Tobacco Use   Smoking status: Never   Smokeless tobacco: Never  Vaping Use   Vaping Use: Never used  Substance Use Topics   Alcohol use: Yes    Comment: occasional   Drug use: No     Allergies  Allergen Reactions   Nuvigil [Armodafinil] Other (  See Comments)    Seizures    Seroquel [Quetiapine] Other (See Comments)    Seizures.   Simvastatin Hives    Review of Systems NEGATIVE UNLESS OTHERWISE INDICATED IN HPI      Objective:     BP 118/79 (BP Location: Left Arm)   Pulse 79   Temp 98.3 F (36.8 C) (Temporal)   Ht '5\' 9"'$  (1.753 m)   Wt 294 lb 6.4 oz (133.5 kg)   SpO2 98%   BMI 43.48 kg/m   Wt Readings from Last 3 Encounters:  06/03/22 294 lb 6.4 oz (133.5 kg)  04/24/22 (!) 300 lb 0.7 oz (136.1 kg)  01/16/22 300 lb (136.1 kg)    BP Readings from Last 3 Encounters:  06/03/22 118/79  04/24/22 134/86  04/09/22 125/68     Physical Exam Constitutional:      Appearance: Normal appearance. He is obese.  Skin:    Comments: Left index finger distal small paronychia. N/V intact. Normal cap refill. Normal ROM.   Procedure: Verbal consent obtained and area prepped with iodine stick. Ethyl chloride used to help with local anesthesia to area. #10 blade to  make a small incision. Very small amount of purulent bloody discharge expressed. Pt tolerated well. Placed band-aid and ointment over the site.   Neurological:     Mental Status: He is alert.     Sensory: No sensory deficit.     Motor: No weakness.  Psychiatric:        Mood and Affect: Mood normal.        Behavior: Behavior normal.        Assessment & Plan:   Problem List Items Addressed This Visit       Endocrine   Type 2 diabetes mellitus ([place])   Relevant Orders   POCT HgB A1C (Completed)   Other Visit Diagnoses     Acute paronychia of finger, left    -  Primary   Need for prophylactic vaccination with combined diphtheria-tetanus-pertussis (DTP) vaccine       Relevant Orders   Tdap vaccine greater than or equal to 7yo IM (Completed)        Meds ordered this encounter  Medications   amoxicillin-clavulanate (AUGMENTIN) 875-125 MG tablet    Sig: Take 1 tablet by mouth 2 (two) times daily for 7 days.    Dispense:  14 tablet    Refill:  0    Order Specific Question:   Supervising Provider    Answer:   Yong Channel, STEPHEN O [[id number]]   1. Acute paronychia of finger, left See procedure note in PE Start on Augmentin as directed Warm soaks, gentle compression Recheck if worse or no imp  2. Type 2 diabetes mellitus with hyperglycemia, without long-term current use of insulin (HCC) Lab Results  Component Value Date   HGBA1C 6.7 (A) 06/03/2022   5 months ago he was at 7.6, so this is much better. Encouraged him to keep up good work. He wants to continue w/o medication at this time. He will f/up with his PCP later this year.  3. Need for prophylactic vaccination with combined diphtheria-tetanus-pertussis (DTP) vaccine Tdap updated today   Return in about 3 months (around 09/03/2022) for diabetes recheck with PCP .  This note was prepared with assistance of Systems analyst. Occasional wrong-word or sound-a-like substitutions may have occurred due to the  inherent limitations of voice recognition software.     Samayah Novinger M Tacarra Justo, PA-C

## 2022-06-03 NOTE — Patient Instructions (Signed)
Good to meet you today.  Your hemoglobin A1c was 6.7 - still considered diabetic, but reasonable to continue to manage with diet and exercise. Recommend follow up with your PCP in the next few months for recheck.  Take the augmentin with food. Warm soaks at least 3 times daily for 10-15 minutes.  Gentle compress the area to remove any further pus from the site after soaking. Recheck if worse or no improvement.

## 2022-09-23 ENCOUNTER — Encounter: Payer: Self-pay | Admitting: Primary Care

## 2022-09-23 ENCOUNTER — Ambulatory Visit (INDEPENDENT_AMBULATORY_CARE_PROVIDER_SITE_OTHER)
Admission: RE | Admit: 2022-09-23 | Discharge: 2022-09-23 | Disposition: A | Discharge status: Home / Self Care | Payer: No Typology Code available for payment source | Source: Ambulatory Visit | Attending: Primary Care | Admitting: Primary Care

## 2022-09-23 ENCOUNTER — Ambulatory Visit: Payer: No Typology Code available for payment source | Admitting: Primary Care

## 2022-09-23 VITALS — BP 120/82 | HR 87 | Temp 97.2°F | Ht 69.0 in | Wt 292.0 lb

## 2022-09-23 DIAGNOSIS — R051 Acute cough: Secondary | ICD-10-CM | POA: Diagnosis not present

## 2022-09-23 LAB — POC COVID19 BINAXNOW: SARS Coronavirus 2 Ag: NEGATIVE

## 2022-09-23 MED ORDER — AZITHROMYCIN 250 MG PO TABS: ORAL_TABLET | ORAL | 0 refills | Status: DC

## 2022-09-23 NOTE — Progress Notes (Signed)
Subjective:    Patient ID: Evan [name], male    DOB: 1966/10/18, 56 y.o.   MRN: [id number]  Cough Pertinent negatives include no fever, headaches or shortness of breath.    Evan [name] is a very pleasant 56 y.o. male with a history of OSA, hypertension, chest pain, chronic fatigue, pneumonia, seasonal allergies who presents today to discuss cough.  Symptom onset 9 days ago with cough. He then began to feel fatigued, more tired, chest congestion, chest wall pain. Over the last 4-5 days symptoms have become more intense.   He denies fevers, known sick contacts. He's taken Mucinex, Nyquil, vitamin C powder with temporary improvement. He has not tested for Covid-19.    Review of Systems  Constitutional:  Positive for fatigue. Negative for fever.  HENT:  Positive for congestion.   Respiratory:  Positive for cough and chest tightness. Negative for shortness of breath.   Neurological:  Negative for headaches.         Past Medical History:  Diagnosis Date   Arthritis    Chronic kidney disease    Colon polyps    benign per pt   Diverticulitis    Hyperlipidemia    Hypertension    Lyme disease    Seizures ([place])     Social History   Socioeconomic History   Marital status: Married    Spouse name: Not on file   Number of children: 4   Years of education: Not on file   Highest education level: Not on file  Occupational History   Occupation: Freight forwarder  Tobacco Use   Smoking status: Never   Smokeless tobacco: Never  Vaping Use   Vaping Use: Never used  Substance and Sexual Activity   Alcohol use: Yes    Comment: occasional   Drug use: No   Sexual activity: Not on file  Other Topics Concern   Not on file  Social History Narrative   Not on file   Social Determinants of Health   Financial Resource Strain: Not on file  Food Insecurity: Not on file  Transportation Needs: Not on file  Physical Activity: Not on file  Stress: Not on file  Social Connections: Not on  file  Intimate Partner Violence: Not on file    Past Surgical History:  Procedure Laterality Date   CARDIAC CATHETERIZATION     CHOLECYSTECTOMY  2021   COLONOSCOPY WITH PROPOFOL N/A 04/24/2022   Procedure: COLONOSCOPY WITH PROPOFOL;  Surgeon: Carol Ada, MD;  [facility]: [facility];  Service: Gastroenterology;  Laterality: N/A;   HERNIA REPAIR     x 2   LEFT HEART CATH AND CORONARY ANGIOGRAPHY N/A 05/04/2018   Procedure: LEFT HEART CATH AND CORONARY ANGIOGRAPHY;  Surgeon: Leonie Man, MD;  Location: [facility];  Service: Cardiovascular;  Laterality: N/A;   MENISCUS REPAIR     NECK SURGERY     NEPHRECTOMY Left 2010   for atrophy and reflux   POLYPECTOMY  04/24/2022   Procedure: POLYPECTOMY;  Surgeon: Carol Ada, MD;  [facility];  Service: Gastroenterology;;   PROSTATE BIOPSY     SHOULDER SURGERY Left     Family History  Problem Relation Age of Onset   Arthritis Mother    Heart disease Mother    Hypertension Mother    Kidney disease Mother    Colon cancer Father        dx in his early 75's   Lung cancer Father    Prostate cancer Father  Breast cancer Father        71 or 94   Lung cancer Brother        smoker   Lung cancer Brother        smoker   Bladder Cancer Brother    Lung cancer Paternal Aunt    Colon cancer Paternal Uncle    Diabetes Paternal Uncle    Arthritis Maternal Grandmother    Colon cancer Maternal Grandmother    Heart disease Maternal Grandmother    Heart disease Maternal Grandfather    Diabetes Paternal Grandfather     Allergies  Allergen Reactions   Nuvigil [Armodafinil] Other (See Comments)    Seizures    Seroquel [Quetiapine] Other (See Comments)    Seizures.   Simvastatin Hives    Current Outpatient Medications on File Prior to Visit  Medication Sig Dispense Refill   olmesartan (BENICAR) 20 MG tablet TAKE 1 TABLET (20 MG TOTAL) BY MOUTH DAILY. FOR BLOOD PRESSURE. 90 tablet 2   omeprazole (PRILOSEC) 20  MG capsule Take 1 capsule (20 mg total) by mouth daily. 60 capsule 3   spironolactone (ALDACTONE) 25 MG tablet TAKE 1 TABLET BY MOUTH EVERY DAY FOR BLOOD PRESSURE 90 tablet 2   furosemide (LASIX) 20 MG tablet Take 1 tablet ('20mg'$ ) AS NEEDED for wt gain of 3lbs overnight or 5 lbs in one week. (Patient not taking: Reported on 09/23/2022) 30 tablet 0   potassium chloride (KLOR-CON) 10 MEQ tablet Take 1 tablet (10 mEq total) by mouth as directed. Take one tablet only when take Lasix. (Patient not taking: Reported on 09/23/2022) 30 tablet 0   No current facility-administered medications on file prior to visit.    BP 120/82   Pulse 87   Temp (!) 97.2 F (36.2 C) (Temporal)   Ht '5\' 9"'$  (1.753 m)   Wt 292 lb (132.5 kg)   SpO2 95%   BMI 43.12 kg/m  Objective:   Physical Exam Constitutional:      Appearance: He is ill-appearing.  HENT:     Right Ear: Tympanic membrane and ear canal normal.     Left Ear: Tympanic membrane and ear canal normal.     Nose: No mucosal edema.     Right Sinus: No maxillary sinus tenderness or frontal sinus tenderness.     Left Sinus: No maxillary sinus tenderness or frontal sinus tenderness.     Mouth/Throat:     Mouth: Mucous membranes are moist.  Eyes:     Conjunctiva/sclera: Conjunctivae normal.  Cardiovascular:     Rate and Rhythm: Normal rate and regular rhythm.  Pulmonary:     Effort: Pulmonary effort is normal.     Breath sounds: Normal breath sounds. No wheezing or rales.     Comments: Dry cough noted several times throughout visit Musculoskeletal:     Cervical back: Neck supple.  Skin:    General: Skin is warm and dry.           Assessment & Plan:   Problem List Items Addressed This Visit       Other   Acute cough - Primary    Uncontrolled, appears sickly today.  Chest xray pending. Negative Covid-19 test today.   Start Azithromycin antibiotics for infection. Take 2 tablets by mouth today, then 1 tablet daily for 4 additional  days.  He has Best boy at home and will start those. He declines cough medication with codeine.   Return precautions provided.  Relevant Medications   azithromycin (ZITHROMAX) 250 MG tablet   Other Relevant Orders   DG Chest 2 View       [name], NP

## 2022-09-23 NOTE — Addendum Note (Signed)
Addended by: Sherrilee Gilles B on: 09/23/2022 01:24 PM   Modules accepted: Orders

## 2022-09-23 NOTE — Assessment & Plan Note (Signed)
Uncontrolled, appears sickly today.  Chest xray pending. Negative Covid-19 test today.   Start Azithromycin antibiotics for infection. Take 2 tablets by mouth today, then 1 tablet daily for 4 additional days.  He has Best boy at home and will start those. He declines cough medication with codeine.   Return precautions provided.

## 2022-09-30 NOTE — Telephone Encounter (Signed)
Patient states he is still experiencing a lot of congestion, and cough. He finished the antibiotic as prescribed.  He states his energy levels are up a bit from where they were when he was seen 09/23/22. Patient declines follow up office or virtual visit due to his work schedule being busy this week and next. Please advise.

## 2022-11-16 ENCOUNTER — Telehealth: Payer: Self-pay | Admitting: Primary Care

## 2022-11-16 DIAGNOSIS — I10 Essential (primary) hypertension: Secondary | ICD-10-CM

## 2022-11-16 NOTE — Telephone Encounter (Signed)
Patient is due for follow up in late January 2024, this will be required prior to any further refills.  Please schedule.

## 2022-11-17 NOTE — Telephone Encounter (Signed)
Called patient back, and informed that this medication is for his blood pressure and he should be taking it daily. He realized he was thinking of a different medication, and that he is indeed taking sprinolactone daily for blood pressure.  Scheduled follow up 01/08/22.

## 2022-11-17 NOTE — Telephone Encounter (Signed)
Pt stated the meds was being used when he was sick, he no longer needs this medication. Call back # [phone]

## 2022-12-11 ENCOUNTER — Other Ambulatory Visit: Payer: Self-pay | Admitting: Primary Care

## 2022-12-11 DIAGNOSIS — I10 Essential (primary) hypertension: Secondary | ICD-10-CM

## 2022-12-23 DIAGNOSIS — M5412 Radiculopathy, cervical region: Secondary | ICD-10-CM | POA: Diagnosis not present

## 2022-12-23 DIAGNOSIS — M542 Cervicalgia: Secondary | ICD-10-CM | POA: Diagnosis not present

## 2022-12-23 DIAGNOSIS — Z6841 Body Mass Index (BMI) 40.0 and over, adult: Secondary | ICD-10-CM | POA: Diagnosis not present

## 2023-01-08 ENCOUNTER — Encounter: Payer: Self-pay | Admitting: Primary Care

## 2023-01-08 ENCOUNTER — Ambulatory Visit: Payer: BLUE CROSS/BLUE SHIELD | Admitting: Primary Care

## 2023-01-08 VITALS — BP 118/78 | HR 70 | Temp 97.4°F | Ht 69.0 in | Wt 298.0 lb

## 2023-01-08 DIAGNOSIS — E785 Hyperlipidemia, unspecified: Secondary | ICD-10-CM

## 2023-01-08 DIAGNOSIS — E1165 Type 2 diabetes mellitus with hyperglycemia: Secondary | ICD-10-CM

## 2023-01-08 DIAGNOSIS — R1084 Generalized abdominal pain: Secondary | ICD-10-CM

## 2023-01-08 DIAGNOSIS — Z125 Encounter for screening for malignant neoplasm of prostate: Secondary | ICD-10-CM

## 2023-01-08 DIAGNOSIS — I10 Essential (primary) hypertension: Secondary | ICD-10-CM

## 2023-01-08 DIAGNOSIS — Z86010 Personal history of colonic polyps: Secondary | ICD-10-CM

## 2023-01-08 DIAGNOSIS — M542 Cervicalgia: Secondary | ICD-10-CM

## 2023-01-08 DIAGNOSIS — K219 Gastro-esophageal reflux disease without esophagitis: Secondary | ICD-10-CM | POA: Diagnosis not present

## 2023-01-08 DIAGNOSIS — Z0000 Encounter for general adult medical examination without abnormal findings: Secondary | ICD-10-CM

## 2023-01-08 DIAGNOSIS — G4733 Obstructive sleep apnea (adult) (pediatric): Secondary | ICD-10-CM | POA: Diagnosis not present

## 2023-01-08 LAB — LIPID PANEL
Cholesterol: 234 mg/dL — ABNORMAL HIGH (ref 0–200)
HDL: 36.7 mg/dL — ABNORMAL LOW (ref >39.00)
LDL Cholesterol: 158 mg/dL — ABNORMAL HIGH (ref 0–99)
NonHDL: 197.38
Total CHOL/HDL Ratio: 6
Triglycerides: 199 mg/dL — ABNORMAL HIGH (ref 0.0–149.0)
VLDL: 39.8 mg/dL (ref 0.0–40.0)

## 2023-01-08 LAB — COMPREHENSIVE METABOLIC PANEL
ALT: 30 U/L (ref 0–53)
AST: 24 U/L (ref 0–37)
Albumin: 4.1 g/dL (ref 3.5–5.2)
Alkaline Phosphatase: 51 U/L (ref 39–117)
BUN: 13 mg/dL (ref 6–23)
CO2: 27 mEq/L (ref 19–32)
Calcium: 9.2 mg/dL (ref 8.4–10.5)
Chloride: 102 mEq/L (ref 96–112)
Creatinine, Ser: 0.96 mg/dL (ref 0.40–1.50)
GFR: 87.12 mL/min (ref >60.00)
Glucose, Bld: 133 mg/dL — ABNORMAL HIGH (ref 70–99)
Potassium: 4.2 mEq/L (ref 3.5–5.1)
Sodium: 137 mEq/L (ref 135–145)
Total Bilirubin: 0.6 mg/dL (ref 0.2–1.2)
Total Protein: 6.9 g/dL (ref 6.0–8.3)

## 2023-01-08 LAB — MICROALBUMIN / CREATININE URINE RATIO
Creatinine,U: 146.7 mg/dL
Microalb Creat Ratio: 0.6 mg/g (ref 0.0–30.0)
Microalb, Ur: 0.9 mg/dL (ref 0.0–1.9)

## 2023-01-08 LAB — PSA: PSA: 0.41 ng/mL (ref 0.10–4.00)

## 2023-01-08 LAB — HEMOGLOBIN A1C: Hgb A1c MFr Bld: 7.5 % — ABNORMAL HIGH (ref 4.6–6.5)

## 2023-01-08 NOTE — Assessment & Plan Note (Signed)
Ongoing, following with GI.

## 2023-01-08 NOTE — Assessment & Plan Note (Signed)
Repeat lipid panel pending.  Discussed the importance of a healthy diet and regular exercise in order for weight loss, and to reduce the risk of further co-morbidity.  

## 2023-01-08 NOTE — Assessment & Plan Note (Signed)
Declines all vaccines including Shingrix, pneumonia, and influenza.  PSA due and pending. Colonoscopy UTD, due 2026.  Discussed the importance of a healthy diet and regular exercise in order for weight loss, and to reduce the risk of further co-morbidity.  Exam stable. Labs pending.  Follow up in 1 year for repeat physical.

## 2023-01-08 NOTE — Assessment & Plan Note (Signed)
Reviewed colonoscopy from May 2023, recall due in 2026.

## 2023-01-08 NOTE — Assessment & Plan Note (Signed)
Repeat A1C pending. Not on treatment per his request. Remain off for now.  Foot exam today. Urine microalbumin due and pending.  Follow up in 3-6 months depending on A1C result.

## 2023-01-08 NOTE — Assessment & Plan Note (Signed)
Controlled.  Continue olmesartan 20 mg daily and spironolactone 25 mg daily.  CMP pending.

## 2023-01-08 NOTE — Assessment & Plan Note (Signed)
Following with neurosurgery. He is contemplating surgery.

## 2023-01-08 NOTE — Progress Notes (Signed)
Subjective:    Patient ID: Evan [name], male    DOB: 05/24/1966, 57 y.o.   MRN: [id number]  HPI  Evan [name] is a very pleasant 57 y.o. male who presents today for complete physical and follow up of chronic conditions.  Immunizations: -Tetanus: Completed in 2023 -Influenza: Declines -Shingles: Never completed  -Pneumonia: Declines    Diet: Fair diet.  Exercise: No regular exercise.  Eye exam: Completes annually  Dental exam: Completes semi-annually   Colonoscopy: Completed in 2023, due 2026 PSA: Due  BP Readings from Last 3 Encounters:  01/08/23 118/78  09/23/22 120/82  06/03/22 118/79         Review of Systems  Constitutional:  Negative for unexpected weight change.  HENT:  Negative for rhinorrhea.   Respiratory:  Negative for cough and shortness of breath.   Cardiovascular:  Negative for chest pain.  Gastrointestinal:  Negative for constipation and diarrhea.  Genitourinary:  Negative for difficulty urinating.  Musculoskeletal:  Positive for arthralgias, myalgias and neck pain.  Skin:  Negative for rash.  Allergic/Immunologic: Negative for environmental allergies.  Neurological:  Positive for dizziness and headaches.  Psychiatric/Behavioral:  The patient is not nervous/anxious.          Past Medical History:  Diagnosis Date   Arthritis    Chronic cough 01/05/2020   Chronic kidney disease    Colon polyps    benign per pt   Diverticulitis    Hyperlipidemia    Hypertension    Lyme disease    Seizures ([place])     Social History   Socioeconomic History   Marital status: Married    Spouse name: Not on file   Number of children: 4   Years of education: Not on file   Highest education level: Not on file  Occupational History   Occupation: Freight forwarder  Tobacco Use   Smoking status: Never   Smokeless tobacco: Never  Vaping Use   Vaping Use: Never used  Substance and Sexual Activity   Alcohol use: Yes    Comment: occasional   Drug use: No    Sexual activity: Not on file  Other Topics Concern   Not on file  Social History Narrative   Not on file   Social Determinants of Health   Financial Resource Strain: Not on file  Food Insecurity: Not on file  Transportation Needs: Not on file  Physical Activity: Not on file  Stress: Not on file  Social Connections: Not on file  Intimate Partner Violence: Not on file    Past Surgical History:  Procedure Laterality Date   [place]  2021   COLONOSCOPY WITH PROPOFOL N/A 04/24/2022   Procedure: COLONOSCOPY WITH PROPOFOL;  Surgeon: Carol Ada, MD;  Location: [facility];  Service: Gastroenterology;  Laterality: N/A;   HERNIA REPAIR     x 2   LEFT HEART CATH AND CORONARY ANGIOGRAPHY N/A 05/04/2018   Procedure: LEFT HEART CATH AND CORONARY ANGIOGRAPHY;  Surgeon: Leonie Man, MD;  Location: [facility];  Service: Cardiovascular;  Laterality: N/A;   MENISCUS REPAIR     NECK SURGERY     NEPHRECTOMY Left 2010   for atrophy and reflux   POLYPECTOMY  04/24/2022   Procedure: POLYPECTOMY;  Surgeon: Carol Ada, MD;  Location: [facility];  Service: Gastroenterology;;   PROSTATE BIOPSY     SHOULDER SURGERY Left     Family History  Problem Relation Age of Onset   Arthritis  Mother    Heart disease Mother    Hypertension Mother    Kidney disease Mother    Colon cancer Father        dx in his early 18's   Lung cancer Father    Prostate cancer Father    Breast cancer Father        65 or 37   Lung cancer Brother        smoker   Lung cancer Brother        smoker   Bladder Cancer Brother    Lung cancer Paternal Aunt    Colon cancer Paternal Uncle    Diabetes Paternal Uncle    Arthritis Maternal Grandmother    Colon cancer Maternal Grandmother    Heart disease Maternal Grandmother    Heart disease Maternal Grandfather    Diabetes Paternal Grandfather     Allergies  Allergen Reactions   Nuvigil [Armodafinil] Other (See  Comments)    Seizures    Seroquel [Quetiapine] Other (See Comments)    Seizures.   Simvastatin Hives    Current Outpatient Medications on File Prior to Visit  Medication Sig Dispense Refill   olmesartan (BENICAR) 20 MG tablet TAKE 1 TABLET (20 MG TOTAL) BY MOUTH DAILY. FOR BLOOD PRESSURE. 90 tablet 0   omeprazole (PRILOSEC) 20 MG capsule Take 1 capsule (20 mg total) by mouth daily. 60 capsule 3   spironolactone (ALDACTONE) 25 MG tablet Take 1 tablet (25 mg total) by mouth daily. for blood pressure. Office visit required for further refills. 60 tablet 0   furosemide (LASIX) 20 MG tablet Take 1 tablet ('20mg'$ ) AS NEEDED for wt gain of 3lbs overnight or 5 lbs in one week. (Patient not taking: Reported on 09/23/2022) 30 tablet 0   potassium chloride (KLOR-CON) 10 MEQ tablet Take 1 tablet (10 mEq total) by mouth as directed. Take one tablet only when take Lasix. (Patient not taking: Reported on 09/23/2022) 30 tablet 0   No current facility-administered medications on file prior to visit.    BP 118/78   Pulse 70   Temp (!) 97.4 F (36.3 C) (Temporal)   Ht '5\' 9"'$  (1.753 m)   Wt 298 lb (135.2 kg)   SpO2 96%   BMI 44.01 kg/m  Objective:   Physical Exam HENT:     Right Ear: Tympanic membrane and ear canal normal.     Left Ear: Tympanic membrane and ear canal normal.     Nose: Nose normal.     Right Sinus: No maxillary sinus tenderness or frontal sinus tenderness.     Left Sinus: No maxillary sinus tenderness or frontal sinus tenderness.  Eyes:     Conjunctiva/sclera: Conjunctivae normal.  Neck:     Thyroid: No thyromegaly.     Vascular: No carotid bruit.  Cardiovascular:     Rate and Rhythm: Normal rate and regular rhythm.     Heart sounds: Normal heart sounds.  Pulmonary:     Effort: Pulmonary effort is normal.     Breath sounds: Normal breath sounds. No wheezing or rales.  Abdominal:     General: Bowel sounds are normal.     Palpations: Abdomen is soft.     Tenderness: There is  no abdominal tenderness.  Musculoskeletal:        General: Normal range of motion.     Cervical back: Neck supple.  Skin:    General: Skin is warm and dry.  Neurological:     Mental Status: He is  alert and oriented to person, place, and time.     Cranial Nerves: No cranial nerve deficit.     Deep Tendon Reflexes: Reflexes are normal and symmetric.  Psychiatric:        Mood and Affect: Mood normal.           Assessment & Plan:  Preventative health care Assessment & Plan: Declines all vaccines including Shingrix, pneumonia, and influenza.  PSA due and pending. Colonoscopy UTD, due 2026.  Discussed the importance of a healthy diet and regular exercise in order for weight loss, and to reduce the risk of further co-morbidity.  Exam stable. Labs pending.  Follow up in 1 year for repeat physical.    Essential hypertension Assessment & Plan: Controlled.  Continue olmesartan 20 mg daily and spironolactone 25 mg daily.  CMP pending.  Orders: -     Comprehensive metabolic panel  Gastroesophageal reflux disease, unspecified whether esophagitis present Assessment & Plan: Controlled.  Continue omeprazole 20 mg daily. Follows with GI.   OSA (obstructive sleep apnea) Assessment & Plan: Compliant to CPAP nightly.  Continue regular use.   Type 2 diabetes mellitus with hyperglycemia, without long-term current use of insulin (HCC) Assessment & Plan: Repeat A1C pending. Not on treatment per his request. Remain off for now.  Foot exam today. Urine microalbumin due and pending.  Follow up in 3-6 months depending on A1C result.   Orders: -     Microalbumin / creatinine urine ratio -     Hemoglobin A1c  Cervicalgia Assessment & Plan: Following with neurosurgery. He is contemplating surgery.    Generalized abdominal pain Assessment & Plan: Ongoing, following with GI.   Hyperlipidemia, unspecified hyperlipidemia type Assessment & Plan: Repeat lipid panel  pending.  Discussed the importance of a healthy diet and regular exercise in order for weight loss, and to reduce the risk of further co-morbidity.   Orders: -     Lipid panel  Personal history of colonic polyps Assessment & Plan: Reviewed colonoscopy from May 2023, recall due in 2026.   Screening for prostate cancer -     PSA        [name], NP

## 2023-01-08 NOTE — Patient Instructions (Signed)
Stop by the lab prior to leaving today. I will notify you of your results once received.   It was a pleasure to see you today!  

## 2023-01-08 NOTE — Assessment & Plan Note (Signed)
Controlled.  Continue omeprazole 20 mg daily. Follows with GI.

## 2023-01-08 NOTE — Assessment & Plan Note (Signed)
Compliant to CPAP nightly.  Continue regular use.

## 2023-01-12 ENCOUNTER — Other Ambulatory Visit: Payer: Self-pay | Admitting: Primary Care

## 2023-01-12 DIAGNOSIS — I10 Essential (primary) hypertension: Secondary | ICD-10-CM

## 2023-03-07 ENCOUNTER — Other Ambulatory Visit: Payer: Self-pay | Admitting: Primary Care

## 2023-03-07 DIAGNOSIS — I10 Essential (primary) hypertension: Secondary | ICD-10-CM

## 2023-05-11 DIAGNOSIS — G4733 Obstructive sleep apnea (adult) (pediatric): Secondary | ICD-10-CM | POA: Diagnosis not present

## 2023-05-27 ENCOUNTER — Ambulatory Visit: Payer: BLUE CROSS/BLUE SHIELD | Admitting: Internal Medicine

## 2023-05-27 ENCOUNTER — Encounter: Payer: Self-pay | Admitting: Internal Medicine

## 2023-05-27 VITALS — BP 110/84 | HR 74 | Temp 97.6°F | Ht 70.0 in | Wt 293.8 lb

## 2023-05-27 DIAGNOSIS — G4733 Obstructive sleep apnea (adult) (pediatric): Secondary | ICD-10-CM

## 2023-05-27 NOTE — Patient Instructions (Addendum)
Continue CPAP as prescribed  Excellent Job A+   

## 2023-05-27 NOTE — Progress Notes (Signed)
Name: Evan [name] MRN: [id number] DOB: 04/22/1966     CONSULTATION DATE: 05/27/2023 REFERRING MD : Chestine Spore  STUDIES:  11/06/20 CXR independently reviewed by Me today  ECHO 2019 Grade 1 Diastolic dysfunction  July 2022 Patient diagnosed with severe sleep apnea AHI of 84 Initiate CPAP therapy HST showed AHI of 84   CHIEF COMPLAINT:  Follow-up assessment for severe sleep apnea    HISTORY OF PRESENT ILLNESS: Patient with diagnosis of severe sleep apnea Compliance report reviewed in detail May 2024 Excellent compliance report Well-controlled OSA Auto CPAP 5-12 AHI reduced More energy less fatigue Patient feels so much better since starting CPAP therapy   No exacerbation at this time No evidence of heart failure at this time No evidence or signs of infection at this time No respiratory distress No fevers, chills, nausea, vomiting, diarrhea No evidence of lower extremity edema No evidence hemoptysis     PAST MEDICAL HISTORY :   has a past medical history of Arthritis, Chronic cough (01/05/2020), Chronic kidney disease, Colon polyps, Diverticulitis, Hyperlipidemia, Hypertension, Lyme disease, and Seizures (HCC).  has a past surgical history that includes Nephrectomy (Left, 2010); Neck surgery; Shoulder surgery (Left); Hernia repair; LEFT HEART CATH AND CORONARY ANGIOGRAPHY (N/A, 05/04/2018); Prostate biopsy; Cholecystectomy (2021); Cardiac catheterization; Meniscus repair; Colonoscopy with propofol (N/A, 04/24/2022); and polypectomy (04/24/2022). Prior to Admission medications   Medication Sig Start Date End Date Taking? Authorizing Provider  betamethasone dipropionate (DIPROLENE) 0.05 % cream Apply 1 application topically daily.  02/16/18   [provider]  blood glucose meter kit and supplies KIT Dispense based on patient and insurance preference. Use up to four times daily as directed. (FOR ICD-9 250.00, 250.01). 02/14/21   Doreene Nest, NP  Cholecalciferol  (VITAMIN D3) 5000 units CAPS Take 1 capsule by mouth as directed.     [provider]  dicyclomine (BENTYL) 20 MG tablet 1 tablet 09/10/20   [provider]  diphenhydrAMINE (BENADRYL) 25 MG tablet Take 25 mg by mouth daily.    [provider]  fluticasone (FLONASE) 50 MCG/ACT nasal spray Place 1 spray into both nostrils 2 (two) times daily. 01/12/20   Doreene Nest, NP  metFORMIN (GLUCOPHAGE-XR) 500 MG 24 hr tablet TAKE 1 TABLET (500 MG TOTAL) BY MOUTH DAILY WITH BREAKFAST. FOR DIABETES. 03/17/21   Lynnda Child, MD  metFORMIN (GLUCOPHAGE-XR) 500 MG 24 hr tablet Take 1 tablet (500 mg total) by mouth daily with breakfast. For diabetes. 03/12/21   Doreene Nest, NP  mupirocin ointment (BACTROBAN) 2 % Apply 1 application topically daily. 06/07/20   [provider]  olmesartan (BENICAR) 20 MG tablet Take 1 tablet (20 mg total) by mouth daily. For blood pressure. 02/14/21   Doreene Nest, NP  omeprazole (PRILOSEC) 20 MG capsule Take 1 capsule (20 mg total) by mouth daily. 05/04/18   Danford, Earl Lites, MD  spironolactone (ALDACTONE) 25 MG tablet Take 1 tablet (25 mg total) by mouth daily. For blood pressure. 02/27/21   Doreene Nest, NP  Vitamin D, Ergocalciferol, (DRISDOL) 1.25 MG (50000 UNIT) CAPS capsule 1 capsule    [provider]   Allergies  Allergen Reactions   Nuvigil [Armodafinil] Other (See Comments)    Seizures    Seroquel [Quetiapine] Other (See Comments)    Seizures.   Simvastatin Hives       Review of Systems: Gen:  Denies  fever, sweats, chills weight loss  HEENT: Denies blurred vision, double vision, ear pain, eye pain,  hearing loss, nose bleeds, sore throat Cardiac:  No dizziness, chest pain or heaviness, chest tightness,edema, No JVD Resp:   No cough, -sputum production, -shortness of breath,-wheezing, -hemoptysis,  Other:  All other systems negative   Physical Examination:   General Appearance: No distress   EYES PERRLA, EOM intact.   NECK Supple, No JVD Pulmonary: normal breath sounds, No wheezing.  CardiovascularNormal S1,S2.  No m/r/g.   Abdomen: Benign, Soft, non-tender. Neurology UE/LE 5/5 strength, no focal deficits Ext pulses intact, cap refill intact ALL OTHER ROS ARE NEGATIVE    ASSESSMENT AND PLAN SYNOPSIS 57 year old pleasant white male seen today for follow-up assessment for severe sleep apnea with an AHI of 84 Previous compliance report reviewed in detail with patient  Severe OSA Compliance report 100% compliance for days and greater than 4 hours AHI significantly reduced to 1 Auto CPAP 5 to 12 cm water pressure Patient use and benefits from therapy   Obesity -recommend significant weight loss -recommend changing diet  Deconditioned state -Recommend increased daily activity and exercise   MEDICATION ADJUSTMENTS/LABS AND TESTS ORDERED:   CURRENT MEDICATIONS REVIEWED AT LENGTH WITH PATIENT TODAY   Patient  satisfied with Plan of action and management. All questions answered  Follow-up in 1 year  Total time spent 20 minutes  Wallis Bamberg Santiago Glad, M.D.  [facility]  Medical Director [facility] Medical Director [facility] Cardio-Pulmonary Department

## 2023-06-10 ENCOUNTER — Encounter: Payer: Self-pay | Admitting: Cardiology

## 2023-06-10 ENCOUNTER — Ambulatory Visit: Payer: BLUE CROSS/BLUE SHIELD | Attending: Cardiology | Admitting: Cardiology

## 2023-06-10 VITALS — BP 108/84 | HR 60 | Ht 70.0 in | Wt 290.4 lb

## 2023-06-10 DIAGNOSIS — R072 Precordial pain: Secondary | ICD-10-CM

## 2023-06-10 DIAGNOSIS — K2100 Gastro-esophageal reflux disease with esophagitis, without bleeding: Secondary | ICD-10-CM | POA: Diagnosis not present

## 2023-06-10 DIAGNOSIS — E7800 Pure hypercholesterolemia, unspecified: Secondary | ICD-10-CM

## 2023-06-10 NOTE — Patient Instructions (Signed)
Medication Instructions:  Your physician recommends that you continue on your current medications as directed. Please refer to the Current Medication list given to you today.  *If you need a refill on your cardiac medications before your next appointment, please call your pharmacy*  Lab Work: -None ordered  Testing/Procedures: Your physician has requested that you have an echocardiogram. Echocardiography is a painless test that uses sound waves to create images of your heart. It provides your doctor with information about the size and shape of your heart and how well your heart's chambers and valves are working. This procedure takes approximately one hour. There are no restrictions for this procedure. Please do NOT wear cologne, perfume, aftershave, or lotions (deodorant is allowed). Please arrive 15 minutes prior to your appointment time.    CT coronary calcium score.   - $99 out of pocket cost at the time of your test - Call [phone] to schedule at your convenience.  [facility] [address] [place], [place] [address]   Coronary CalciumScan A coronary calcium scan is an imaging test used to look for deposits of calcium and other fatty materials (plaques) in the inner lining of the blood vessels of the heart (coronary arteries). These deposits of calcium and plaques can partly clog and narrow the coronary arteries without producing any symptoms or warning signs. This puts a person at risk for a heart attack. This test can detect these deposits before symptoms develop. Tell a health care provider about: Any allergies you have. All medicines you are taking, including vitamins, herbs, eye drops, creams, and over-the-counter medicines. Any problems you or family members have had with anesthetic medicines. Any blood disorders you have. Any surgeries you have had. Any medical conditions you have. Whether you are pregnant or may be  pregnant. What are the risks? Generally, this is a safe procedure. However, problems may occur, including: Harm to a pregnant woman and her unborn baby. This test involves the use of radiation. Radiation exposure can be dangerous to a pregnant woman and her unborn baby. If you are pregnant, you generally should not have this procedure done. Slight increase in the risk of cancer. This is because of the radiation involved in the test. What happens before the procedure? No preparation is needed for this procedure. What happens during the procedure? You will undress and remove any jewelry around your neck or chest. You will put on a hospital gown. Sticky electrodes will be placed on your chest. The electrodes will be connected to an electrocardiogram (ECG) machine to record a tracing of the electrical activity of your heart. A CT scanner will take pictures of your heart. During this time, you will be asked to lie still and hold your breath for 2-3 seconds while a picture of your heart is being taken. The procedure may vary among health care providers and hospitals. What happens after the procedure? You can get dressed. You can return to your normal activities. It is up to you to get the results of your test. Ask your health care provider, or the department that is doing the test, when your results will be ready. Summary A coronary calcium scan is an imaging test used to look for deposits of calcium and other fatty materials (plaques) in the inner lining of the blood vessels of the heart (coronary arteries). Generally, this is a safe procedure. Tell your health care provider if you are pregnant or may be pregnant. No preparation is needed  for this procedure. A CT scanner will take pictures of your heart. You can return to your normal activities after the scan is done. This information is not intended to replace advice given to you by your health care provider. Make sure you discuss any questions  you have with your health care provider. Document Released: 05/28/2008 Document Revised: 10/19/2016 Document Reviewed: 10/19/2016 Elsevier Interactive Patient Education  2017 [employer].   Follow-Up: At [facility], you and your health needs are our priority.  As part of our continuing mission to provide you with exceptional heart care, we have created designated Provider Care Teams.  These Care Teams include your primary Cardiologist (physician) and Advanced Practice Providers (APPs -  Physician Assistants and Nurse Practitioners) who all work together to provide you with the care you need, when you need it.  Your next appointment:   2 - 3 month(s)  Provider:   You may see Debbe Odea, MD or one of the following Advanced Practice Providers on your designated Care Team:   Nicolasa Ducking, NP Eula Listen, PA-C Cadence Fransico Michael, PA-C Charlsie Quest, NP    Other Instructions -See attachments for medication information

## 2023-06-10 NOTE — Progress Notes (Signed)
Cardiology Office Note:    Date:  06/10/2023   ID:  Evan [name], DOB [birth year], MRN [id number]  PCP:  Doreene Nest, NP   [facility] Providers Cardiologist:  Debbe Odea, MD     Referring MD: Doreene Nest, NP   Chief Complaint  Patient presents with   Follow-up    Patient last seen at [facility] in 08/2021.  Concerned with med chest pressure with occasional sharp pain.    Also sharp pain that starts at lower abdomen and "shots up" to chest area that wakes  him up at night.    History of Present Illness:    Evan [name] is a 57 y.o. male with a hx of hypertension, hyperlipidemia, GERD, solitary kidney s/p left nephrectomy 2010, who presents due to chest pain.  States having occasional sharp pain originating from his lower abdominal area and radiating to his chest.  Symptoms are not associated with exertion.  Symptoms occur randomly.  Has a history reflux for which she takes omeprazole.  Previously did not tolerate simvastatin.  Would like to research any new cholesterol medication before starting due to history of solitary kidney.  Mother had CAD/CABG in his 39s.  Prior notes/studies Echo 04/2021 EF 60 to 65%, impaired relaxation Lexiscan Myoview 08/2021 no significant ischemia, low risk study Left heart cath 2019 no CAD.   Past Medical History:  Diagnosis Date   Arthritis    Chronic cough 01/05/2020   Chronic kidney disease    Colon polyps    benign per pt   Diverticulitis    Hyperlipidemia    Hypertension    Lyme disease    Seizures (HCC)     Past Surgical History:  Procedure Laterality Date   CARDIAC CATHETERIZATION     CHOLECYSTECTOMY  2021   COLONOSCOPY WITH PROPOFOL N/A 04/24/2022   Procedure: COLONOSCOPY WITH PROPOFOL;  Surgeon: Jeani Hawking, MD;  Location: [facility];  Service: Gastroenterology;  Laterality: N/A;   HERNIA REPAIR     x 2   LEFT HEART CATH AND CORONARY ANGIOGRAPHY N/A 05/04/2018   Procedure: LEFT HEART CATH  AND CORONARY ANGIOGRAPHY;  Surgeon: Marykay Lex, MD;  Location: [facility] INVASIVE CV LAB;  Service: Cardiovascular;  Laterality: N/A;   MENISCUS REPAIR     2022 and 2023   NECK SURGERY     NEPHRECTOMY Left 2010   for atrophy and reflux   POLYPECTOMY  04/24/2022   Procedure: POLYPECTOMY;  Surgeon: Jeani Hawking, MD;  Location: [facility];  Service: Gastroenterology;;   PROSTATE BIOPSY     SHOULDER SURGERY Left     Current Medications: Current Meds  Medication Sig   furosemide (LASIX) 20 MG tablet Take 1 tablet (20mg ) AS NEEDED for wt gain of 3lbs overnight or 5 lbs in one week.   olmesartan (BENICAR) 20 MG tablet TAKE 1 TABLET (20 MG TOTAL) BY MOUTH DAILY. FOR BLOOD PRESSURE.   omeprazole (PRILOSEC) 20 MG capsule Take 1 capsule (20 mg total) by mouth daily.   potassium chloride (KLOR-CON) 10 MEQ tablet Take 1 tablet (10 mEq total) by mouth as directed. Take one tablet only when take Lasix. (Patient taking differently: Take 10 mEq by mouth as needed. Take one tablet only when take Lasix.)   spironolactone (ALDACTONE) 25 MG tablet Take 1 tablet (25 mg total) by mouth daily. for blood pressure.     Allergies:   Nuvigil [armodafinil], Seroquel [quetiapine], and Simvastatin   Social History   Socioeconomic History   Marital  status: Married    Spouse name: Not on file   Number of children: 4   Years of education: Not on file   Highest education level: Not on file  Occupational History   Occupation: Production designer, theatre/television/film  Tobacco Use   Smoking status: Never   Smokeless tobacco: Never  Vaping Use   Vaping Use: Never used  Substance and Sexual Activity   Alcohol use: Yes    Comment: occasional   Drug use: No   Sexual activity: Not on file  Other Topics Concern   Not on file  Social History Narrative   Not on file   Social Determinants of Health   Financial Resource Strain: Not on file  Food Insecurity: Not on file  Transportation Needs: Not on file  Physical Activity: Not on file   Stress: Not on file  Social Connections: Not on file     Family History: The patient's family history includes Arthritis in his maternal grandmother and mother; Bladder Cancer in his brother; Breast cancer in his father; Colon cancer in his father, maternal grandmother, and paternal uncle; Diabetes in his paternal grandfather and paternal uncle; Heart disease in his maternal grandfather, maternal grandmother, and mother; Hypertension in his mother; Kidney disease in his mother; Lung cancer in his brother, brother, father, and paternal aunt; Prostate cancer in his father.  ROS:   Please see the history of present illness.     All other systems reviewed and are negative.  EKGs/Labs/Other Studies Reviewed:    The following studies were reviewed today:  EKG Interpretation Date/Time:  Thursday June 10 2023 09:54:37 EDT Ventricular Rate:  60 PR Interval:  184 QRS Duration:  98 QT Interval:  406 QTC Calculation: 406 R Axis:   -36  Text Interpretation: Normal sinus rhythm Left axis deviation Confirmed by Debbe Odea ([id number]) on 06/10/2023 9:59:07 AM    Recent Labs: 01/08/2023: ALT 30; BUN 13; Creatinine, Ser 0.96; Potassium 4.2; Sodium 137  Recent Lipid Panel    Component Value Date/Time   CHOL 234 (H) 01/08/2023 0923   TRIG 199.0 (H) 01/08/2023 0923   HDL 36.70 (L) 01/08/2023 0923   CHOLHDL 6 01/08/2023 0923   VLDL 39.8 01/08/2023 0923   LDLCALC 158 (H) 01/08/2023 0923   LDLDIRECT 198.0 12/31/2021 1454     Risk Assessment/Calculations:              Physical Exam:    VS:  BP 108/84 (BP Location: Left Arm, Patient Position: Sitting, Cuff Size: Large)   Pulse 60   Ht 5\' 10"  (1.778 m)   Wt 290 lb 6.4 oz (131.7 kg)   SpO2 96%   BMI 41.67 kg/m     Wt Readings from Last 3 Encounters:  06/10/23 290 lb 6.4 oz (131.7 kg)  05/27/23 293 lb 12.8 oz (133.3 kg)  01/08/23 298 lb (135.2 kg)     GEN:  Well nourished, well developed in no acute distress HEENT:  Normal NECK: No JVD; No carotid bruits CARDIAC: RRR, no murmurs, rubs, gallops RESPIRATORY:  Clear to auscultation without rales, wheezing or rhonchi  ABDOMEN: Soft, non-tender, non-distended MUSCULOSKELETAL:  No edema; No deformity  SKIN: Warm and dry NEUROLOGIC:  Alert and oriented x 3 PSYCHIATRIC:  Normal affect   ASSESSMENT:    1. Precordial chest pain   2. Pure hypercholesterolemia   3. Gastroesophageal reflux disease with esophagitis without hemorrhage    PLAN:    In order of problems listed above:  Chest pain, appears noncardiac, possible  GI in etiology with pain originating below the lower abdominal quadrant.  Previous cardiac workup with left heart cath, Myoview unrevealing.  Echo 2 years ago showed normal EF.  Repeat echo. Hyperlipidemia, did not tolerate statins.  Zetia and PCSK9 recommended, patient will like to research both drugs and let us know if he is willing to take.  Obtain coronary calcium score. History of GERD, abdominal pain, on omeprazole.  Follow-up with PCP and gastroenterology.   Follow-up after echo and calcium score.      Medication Adjustments/Labs and Tests Ordered: Current medicines are reviewed at length with the patient today.  Concerns regarding medicines are outlined above.  Orders Placed This Encounter  Procedures   CT CARDIAC SCORING   EKG 12-Lead   ECHOCARDIOGRAM COMPLETE   No orders of the defined types were placed in this encounter.   Patient Instructions  Medication Instructions:  Your physician recommends that you continue on your current medications as directed. Please refer to the Current Medication list given to you today.  *If you need a refill on your cardiac medications before your next appointment, please call your pharmacy*  Lab Work: -None ordered  Testing/Procedures: Your physician has requested that you have an echocardiogram. Echocardiography is a painless test that uses sound waves to create images of your heart.  It provides your doctor with information about the size and shape of your heart and how well your heart's chambers and valves are working. This procedure takes approximately one hour. There are no restrictions for this procedure. Please do NOT wear cologne, perfume, aftershave, or lotions (deodorant is allowed). Please arrive 15 minutes prior to your appointment time.    CT coronary calcium score.   - $99 out of pocket cost at the time of your test - Call [phone] to schedule at your convenience.  [facility] [address] [place], [place] [address]   Coronary CalciumScan A coronary calcium scan is an imaging test used to look for deposits of calcium and other fatty materials (plaques) in the inner lining of the blood vessels of the heart (coronary arteries). These deposits of calcium and plaques can partly clog and narrow the coronary arteries without producing any symptoms or warning signs. This puts a person at risk for a heart attack. This test can detect these deposits before symptoms develop. Tell a health care provider about: Any allergies you have. All medicines you are taking, including vitamins, herbs, eye drops, creams, and over-the-counter medicines. Any problems you or family members have had with anesthetic medicines. Any blood disorders you have. Any surgeries you have had. Any medical conditions you have. Whether you are pregnant or may be pregnant. What are the risks? Generally, this is a safe procedure. However, problems may occur, including: Harm to a pregnant woman and her unborn baby. This test involves the use of radiation. Radiation exposure can be dangerous to a pregnant woman and her unborn baby. If you are pregnant, you generally should not have this procedure done. Slight increase in the risk of cancer. This is because of the radiation involved in the test. What happens before the procedure? No preparation is  needed for this procedure. What happens during the procedure? You will undress and remove any jewelry around your neck or chest. You will put on a hospital gown. Sticky electrodes will be placed on your chest. The electrodes will be connected to an electrocardiogram (ECG) machine to record a tracing of the electrical activity of  your heart. A CT scanner will take pictures of your heart. During this time, you will be asked to lie still and hold your breath for 2-3 seconds while a picture of your heart is being taken. The procedure may vary among health care providers and hospitals. What happens after the procedure? You can get dressed. You can return to your normal activities. It is up to you to get the results of your test. Ask your health care provider, or the department that is doing the test, when your results will be ready. Summary A coronary calcium scan is an imaging test used to look for deposits of calcium and other fatty materials (plaques) in the inner lining of the blood vessels of the heart (coronary arteries). Generally, this is a safe procedure. Tell your health care provider if you are pregnant or may be pregnant. No preparation is needed for this procedure. A CT scanner will take pictures of your heart. You can return to your normal activities after the scan is done. This information is not intended to replace advice given to you by your health care provider. Make sure you discuss any questions you have with your health care provider. Document Released: 05/28/2008 Document Revised: 10/19/2016 Document Reviewed: 10/19/2016 Elsevier Interactive Patient Education  2017 [employer].   Follow-Up: At [facility], you and your health needs are our priority.  As part of our continuing mission to provide you with exceptional heart care, we have created designated Provider Care Teams.  These Care Teams include your primary Cardiologist (physician) and Advanced Practice  Providers (APPs -  Physician Assistants and Nurse Practitioners) who all work together to provide you with the care you need, when you need it.  Your next appointment:   2 - 3 month(s)  Provider:   You may see Debbe Odea, MD or one of the following Advanced Practice Providers on your designated Care Team:   Nicolasa Ducking, NP Eula Listen, PA-C Cadence Fransico Michael, PA-C Charlsie Quest, NP    Other Instructions -See attachments for medication information    Signed, Debbe Odea, MD  06/10/2023 11:02 AM    [facility]

## 2023-06-11 DIAGNOSIS — G4733 Obstructive sleep apnea (adult) (pediatric): Secondary | ICD-10-CM | POA: Diagnosis not present

## 2023-06-24 ENCOUNTER — Ambulatory Visit
Admission: RE | Admit: 2023-06-24 | Discharge: 2023-06-24 | Disposition: A | Discharge status: Home / Self Care | Payer: BLUE CROSS/BLUE SHIELD | Source: Ambulatory Visit | Attending: Cardiology | Admitting: Cardiology

## 2023-06-24 ENCOUNTER — Ambulatory Visit: Payer: BLUE CROSS/BLUE SHIELD

## 2023-06-24 DIAGNOSIS — E7800 Pure hypercholesterolemia, unspecified: Secondary | ICD-10-CM | POA: Insufficient documentation

## 2023-06-24 DIAGNOSIS — R072 Precordial pain: Secondary | ICD-10-CM | POA: Insufficient documentation

## 2023-06-25 LAB — ECHOCARDIOGRAM COMPLETE
Area-P 1/2: 3.48 cm2
S' Lateral: 3.1 cm

## 2023-07-11 DIAGNOSIS — G4733 Obstructive sleep apnea (adult) (pediatric): Secondary | ICD-10-CM | POA: Diagnosis not present

## 2023-07-26 ENCOUNTER — Emergency Department: Payer: BLUE CROSS/BLUE SHIELD

## 2023-07-26 ENCOUNTER — Emergency Department
Admission: EM | Admit: 2023-07-26 | Discharge: 2023-07-26 | Disposition: A | Discharge status: Home / Self Care | Payer: BLUE CROSS/BLUE SHIELD | Source: Home / Self Care | Attending: Emergency Medicine | Admitting: Emergency Medicine

## 2023-07-26 ENCOUNTER — Other Ambulatory Visit: Payer: Self-pay

## 2023-07-26 DIAGNOSIS — Z888 Allergy status to other drugs, medicaments and biological substances status: Secondary | ICD-10-CM | POA: Diagnosis not present

## 2023-07-26 DIAGNOSIS — I129 Hypertensive chronic kidney disease with stage 1 through stage 4 chronic kidney disease, or unspecified chronic kidney disease: Secondary | ICD-10-CM | POA: Diagnosis not present

## 2023-07-26 DIAGNOSIS — Z841 Family history of disorders of kidney and ureter: Secondary | ICD-10-CM | POA: Diagnosis not present

## 2023-07-26 DIAGNOSIS — E871 Hypo-osmolality and hyponatremia: Secondary | ICD-10-CM | POA: Diagnosis not present

## 2023-07-26 DIAGNOSIS — K219 Gastro-esophageal reflux disease without esophagitis: Secondary | ICD-10-CM | POA: Diagnosis not present

## 2023-07-26 DIAGNOSIS — R109 Unspecified abdominal pain: Secondary | ICD-10-CM | POA: Diagnosis not present

## 2023-07-26 DIAGNOSIS — E785 Hyperlipidemia, unspecified: Secondary | ICD-10-CM | POA: Diagnosis not present

## 2023-07-26 DIAGNOSIS — Z833 Family history of diabetes mellitus: Secondary | ICD-10-CM | POA: Diagnosis not present

## 2023-07-26 DIAGNOSIS — K760 Fatty (change of) liver, not elsewhere classified: Secondary | ICD-10-CM | POA: Diagnosis not present

## 2023-07-26 DIAGNOSIS — E1122 Type 2 diabetes mellitus with diabetic chronic kidney disease: Secondary | ICD-10-CM | POA: Diagnosis not present

## 2023-07-26 DIAGNOSIS — Z800 Family history of malignant neoplasm of digestive organs: Secondary | ICD-10-CM | POA: Diagnosis not present

## 2023-07-26 DIAGNOSIS — Z8249 Family history of ischemic heart disease and other diseases of the circulatory system: Secondary | ICD-10-CM | POA: Diagnosis not present

## 2023-07-26 DIAGNOSIS — Z8719 Personal history of other diseases of the digestive system: Secondary | ICD-10-CM | POA: Diagnosis not present

## 2023-07-26 DIAGNOSIS — Z9049 Acquired absence of other specified parts of digestive tract: Secondary | ICD-10-CM | POA: Diagnosis not present

## 2023-07-26 DIAGNOSIS — G4733 Obstructive sleep apnea (adult) (pediatric): Secondary | ICD-10-CM | POA: Diagnosis not present

## 2023-07-26 DIAGNOSIS — R197 Diarrhea, unspecified: Secondary | ICD-10-CM | POA: Diagnosis not present

## 2023-07-26 DIAGNOSIS — D72829 Elevated white blood cell count, unspecified: Secondary | ICD-10-CM | POA: Diagnosis not present

## 2023-07-26 DIAGNOSIS — K5730 Diverticulosis of large intestine without perforation or abscess without bleeding: Secondary | ICD-10-CM | POA: Diagnosis not present

## 2023-07-26 DIAGNOSIS — R1012 Left upper quadrant pain: Secondary | ICD-10-CM | POA: Insufficient documentation

## 2023-07-26 DIAGNOSIS — E119 Type 2 diabetes mellitus without complications: Secondary | ICD-10-CM | POA: Diagnosis not present

## 2023-07-26 DIAGNOSIS — Z905 Acquired absence of kidney: Secondary | ICD-10-CM | POA: Diagnosis not present

## 2023-07-26 DIAGNOSIS — Z86010 Personal history of colonic polyps: Secondary | ICD-10-CM | POA: Diagnosis not present

## 2023-07-26 DIAGNOSIS — K439 Ventral hernia without obstruction or gangrene: Secondary | ICD-10-CM | POA: Diagnosis not present

## 2023-07-26 DIAGNOSIS — E861 Hypovolemia: Secondary | ICD-10-CM | POA: Diagnosis not present

## 2023-07-26 DIAGNOSIS — Z6841 Body Mass Index (BMI) 40.0 and over, adult: Secondary | ICD-10-CM | POA: Diagnosis not present

## 2023-07-26 DIAGNOSIS — I10 Essential (primary) hypertension: Secondary | ICD-10-CM | POA: Diagnosis not present

## 2023-07-26 DIAGNOSIS — K5900 Constipation, unspecified: Secondary | ICD-10-CM | POA: Diagnosis not present

## 2023-07-26 DIAGNOSIS — G40909 Epilepsy, unspecified, not intractable, without status epilepticus: Secondary | ICD-10-CM | POA: Diagnosis not present

## 2023-07-26 LAB — COMPREHENSIVE METABOLIC PANEL
ALT: 37 U/L (ref 0–44)
AST: 37 U/L (ref 15–41)
Albumin: 4.1 g/dL (ref 3.5–5.0)
Alkaline Phosphatase: 47 U/L (ref 38–126)
Anion gap: 9 (ref 5–15)
BUN: 14 mg/dL (ref 6–20)
CO2: 23 mmol/L (ref 22–32)
Calcium: 9.2 mg/dL (ref 8.9–10.3)
Chloride: 105 mmol/L (ref 98–111)
Creatinine, Ser: 1.05 mg/dL (ref 0.61–1.24)
GFR, Estimated: >60 mL/min (ref >60)
Glucose, Bld: 150 mg/dL — ABNORMAL HIGH (ref 70–99)
Potassium: 4 mmol/L (ref 3.5–5.1)
Sodium: 137 mmol/L (ref 135–145)
Total Bilirubin: 1.1 mg/dL (ref 0.3–1.2)
Total Protein: 7.5 g/dL (ref 6.5–8.1)

## 2023-07-26 LAB — CBC
HCT: 47.5 % (ref 39.0–52.0)
Hemoglobin: 16.6 g/dL (ref 13.0–17.0)
MCH: 31.1 pg (ref 26.0–34.0)
MCHC: 34.9 g/dL (ref 30.0–36.0)
MCV: 89 fL (ref 80.0–100.0)
Platelets: 253 K/uL (ref 150–400)
RBC: 5.34 MIL/uL (ref 4.22–5.81)
RDW: 13.2 % (ref 11.5–15.5)
WBC: 8.2 K/uL (ref 4.0–10.5)
nRBC: 0 % (ref 0.0–0.2)

## 2023-07-26 LAB — LIPASE, BLOOD: Lipase: 32 U/L (ref 11–51)

## 2023-07-26 MED ORDER — SODIUM CHLORIDE 0.9 % IV SOLN: Freq: Once | INTRAVENOUS | Status: AC

## 2023-07-26 MED ORDER — BISACODYL 10 MG RE SUPP: 10.0000 mg | RECTAL | 0 refills | Status: DC | PRN

## 2023-07-26 MED ORDER — ONDANSETRON HCL 4 MG/2ML IJ SOLN
4.0000 mg | Freq: Once | INTRAMUSCULAR | Status: AC
  Administered 2023-07-26: 4 mg via INTRAVENOUS
  Filled 2023-07-26: qty 2

## 2023-07-26 MED ORDER — POLYETHYLENE GLYCOL 3350 17 G PO PACK: 17.0000 g | PACK | Freq: Every day | ORAL | 0 refills | Status: DC

## 2023-07-26 MED ORDER — OXYCODONE-ACETAMINOPHEN 5-325 MG PO TABS: 1.0000 | ORAL_TABLET | Freq: Four times a day (QID) | ORAL | 0 refills | Status: DC | PRN

## 2023-07-26 MED ORDER — MORPHINE SULFATE (PF) 4 MG/ML IV SOLN
4.0000 mg | Freq: Once | INTRAVENOUS | Status: AC
  Administered 2023-07-26: 4 mg via INTRAVENOUS
  Filled 2023-07-26: qty 1

## 2023-07-26 MED ORDER — IOHEXOL 300 MG/ML  SOLN
100.0000 mL | Freq: Once | INTRAMUSCULAR | Status: AC | PRN
  Administered 2023-07-26: 100 mL via INTRAVENOUS

## 2023-07-26 MED ORDER — DOCUSATE SODIUM 100 MG PO CAPS: 100.0000 mg | ORAL_CAPSULE | Freq: Two times a day (BID) | ORAL | 2 refills | Status: DC

## 2023-07-26 NOTE — ED Triage Notes (Signed)
Pt to ED for left sided abd pain radiating to back for months, worsening this week. Reports had kidney removed in 2010, had adhesions removed from this surgery, was told would need another surgery to have adhesions removed.

## 2023-07-26 NOTE — ED Provider Notes (Signed)
[facility] Provider Note    Event Date/Time   First MD Initiated Contact with Patient 07/26/23 [phone]     (approximate)   History   Abdominal Pain   HPI  Evan [name] is a 57 y.o. male with a history of a left nephrectomy as well as lysis of adhesions who presents with complaints of left flank and left abdominal pain.  He reports this has been intermittently occurring over the last several months, he has attributed it to increased adhesions however over the last several days pain has become much worse.  He is unable to lie on his left side because of the pain.  Denies dysuria, no fevers reported.  No nausea or vomiting but does have loose stools intermittently after having gallbladder removed.     Physical Exam   Triage Vital Signs: ED Triage Vitals  Encounter Vitals Group     BP 07/26/23 0716 134/84     Systolic BP Percentile --      Diastolic BP Percentile --      Pulse Rate 07/26/23 0714 87     Resp 07/26/23 0714 20     Temp 07/26/23 0716 97.9 F (36.6 C)     Temp src --      SpO2 07/26/23 0716 95 %     Weight 07/26/23 0714 132.5 kg (292 lb)     Height 07/26/23 0714 1.778 m (5\' 10" )     Head Circumference --      Peak Flow --      Pain Score 07/26/23 0714 8     Pain Loc --      Pain Education --      Exclude from Growth Chart --     Most recent vital signs: Vitals:   07/26/23 0716 07/26/23 0947  BP: 134/84 130/82  Pulse:  81  Resp:  17  Temp: 97.9 F (36.6 C) 98 F (36.7 C)  SpO2: 95% 98%     General: Awake, no distress.  CV:  Good peripheral perfusion.  Resp:  Normal effort.  Abd:  No distention.  Tenderness to palpation left flank, left lower quadrant Other:     ED Results / Procedures / Treatments   Labs (all labs ordered are listed, but only abnormal results are displayed) Labs Reviewed  COMPREHENSIVE METABOLIC PANEL - Abnormal; Notable for the following components:      Result Value   Glucose, Bld 150 (*)    All  other components within normal limits  LIPASE, BLOOD  CBC  URINALYSIS, ROUTINE W REFLEX MICROSCOPIC     EKG     RADIOLOGY CT scan viewed interpret by me, no evidence of diverticulitis    PROCEDURES:  Critical Care performed:   Procedures   MEDICATIONS ORDERED IN ED: Medications  morphine (PF) 4 MG/ML injection 4 mg (4 mg Intravenous Given 07/26/23 0808)  ondansetron (ZOFRAN) injection 4 mg (4 mg Intravenous Given 07/26/23 0808)  0.9 %  sodium chloride infusion (0 mLs Intravenous Stopped 07/26/23 0947)  iohexol (OMNIPAQUE) 300 MG/ML solution 100 mL (100 mLs Intravenous Contrast Given 07/26/23 0846)     IMPRESSION / MDM / ASSESSMENT AND PLAN / ED COURSE  I reviewed the triage vital signs and the nursing notes. Patient's presentation is most consistent with acute presentation with potential threat to life or bodily function.  Patient presents with left-sided abdominal pain as detailed above, nephrectomy on that side, not consistent with ureterolithiasis given that, doubt UTI given lack of  dysuria.  Possibility of diverticulitis, SBO, adhesions  Will treat with IV morphine, IV Zofran, IV fluids, obtain CT abdomen pelvis and reevaluate.  CT scan is reassuring, patient feeling much better after treatment, discussed admission with the patient for pain control, he would prefer outpatient follow-up with analgesics, he knows he can return anytime.  Prescriptions provided      FINAL CLINICAL IMPRESSION(S) / ED DIAGNOSES   Final diagnoses:  Left upper quadrant abdominal pain     Rx / DC Orders   ED Discharge Orders          Ordered    Ambulatory referral to General Surgery        07/26/23 0941    oxyCODONE-acetaminophen (PERCOCET) 5-325 MG tablet  Every 6 hours PRN        07/26/23 0941    polyethylene glycol (MIRALAX) 17 g packet  Daily        07/26/23 0941    bisacodyl (DULCOLAX) 10 MG suppository  As needed        07/26/23 0941    docusate sodium (COLACE) 100 MG  capsule  2 times daily        07/26/23 0941             Note:  This document was prepared using Dragon voice recognition software and may include unintentional dictation errors.   Jene Every, MD 07/26/23 [phone]

## 2023-07-27 ENCOUNTER — Telehealth: Payer: Self-pay

## 2023-07-27 ENCOUNTER — Inpatient Hospital Stay
Admission: EM | Admit: 2023-07-27 | Discharge: 2023-07-29 | DRG: 392 | Disposition: A | Discharge status: Home / Self Care | Payer: BLUE CROSS/BLUE SHIELD | Attending: Internal Medicine | Admitting: Internal Medicine

## 2023-07-27 ENCOUNTER — Other Ambulatory Visit: Payer: Self-pay

## 2023-07-27 ENCOUNTER — Emergency Department: Payer: BLUE CROSS/BLUE SHIELD

## 2023-07-27 DIAGNOSIS — D72829 Elevated white blood cell count, unspecified: Secondary | ICD-10-CM | POA: Diagnosis present

## 2023-07-27 DIAGNOSIS — E785 Hyperlipidemia, unspecified: Secondary | ICD-10-CM | POA: Diagnosis present

## 2023-07-27 DIAGNOSIS — G40909 Epilepsy, unspecified, not intractable, without status epilepticus: Secondary | ICD-10-CM | POA: Diagnosis present

## 2023-07-27 DIAGNOSIS — Z9049 Acquired absence of other specified parts of digestive tract: Secondary | ICD-10-CM | POA: Diagnosis not present

## 2023-07-27 DIAGNOSIS — R1012 Left upper quadrant pain: Secondary | ICD-10-CM | POA: Diagnosis present

## 2023-07-27 DIAGNOSIS — I129 Hypertensive chronic kidney disease with stage 1 through stage 4 chronic kidney disease, or unspecified chronic kidney disease: Secondary | ICD-10-CM | POA: Diagnosis present

## 2023-07-27 DIAGNOSIS — E871 Hypo-osmolality and hyponatremia: Secondary | ICD-10-CM

## 2023-07-27 DIAGNOSIS — Z800 Family history of malignant neoplasm of digestive organs: Secondary | ICD-10-CM | POA: Diagnosis not present

## 2023-07-27 DIAGNOSIS — R109 Unspecified abdominal pain: Secondary | ICD-10-CM

## 2023-07-27 DIAGNOSIS — I10 Essential (primary) hypertension: Secondary | ICD-10-CM | POA: Diagnosis not present

## 2023-07-27 DIAGNOSIS — E861 Hypovolemia: Secondary | ICD-10-CM | POA: Diagnosis present

## 2023-07-27 DIAGNOSIS — R197 Diarrhea, unspecified: Secondary | ICD-10-CM | POA: Diagnosis present

## 2023-07-27 DIAGNOSIS — Z905 Acquired absence of kidney: Secondary | ICD-10-CM

## 2023-07-27 DIAGNOSIS — Z79899 Other long term (current) drug therapy: Secondary | ICD-10-CM

## 2023-07-27 DIAGNOSIS — Z8249 Family history of ischemic heart disease and other diseases of the circulatory system: Secondary | ICD-10-CM

## 2023-07-27 DIAGNOSIS — Z8719 Personal history of other diseases of the digestive system: Secondary | ICD-10-CM

## 2023-07-27 DIAGNOSIS — Z833 Family history of diabetes mellitus: Secondary | ICD-10-CM | POA: Diagnosis not present

## 2023-07-27 DIAGNOSIS — Z888 Allergy status to other drugs, medicaments and biological substances status: Secondary | ICD-10-CM | POA: Diagnosis not present

## 2023-07-27 DIAGNOSIS — Z841 Family history of disorders of kidney and ureter: Secondary | ICD-10-CM

## 2023-07-27 DIAGNOSIS — Z6841 Body Mass Index (BMI) 40.0 and over, adult: Secondary | ICD-10-CM | POA: Diagnosis not present

## 2023-07-27 DIAGNOSIS — E119 Type 2 diabetes mellitus without complications: Secondary | ICD-10-CM

## 2023-07-27 DIAGNOSIS — K5900 Constipation, unspecified: Secondary | ICD-10-CM | POA: Diagnosis present

## 2023-07-27 DIAGNOSIS — Z86010 Personal history of colonic polyps: Secondary | ICD-10-CM

## 2023-07-27 DIAGNOSIS — K219 Gastro-esophageal reflux disease without esophagitis: Secondary | ICD-10-CM | POA: Insufficient documentation

## 2023-07-27 DIAGNOSIS — G4733 Obstructive sleep apnea (adult) (pediatric): Secondary | ICD-10-CM | POA: Diagnosis present

## 2023-07-27 DIAGNOSIS — E1122 Type 2 diabetes mellitus with diabetic chronic kidney disease: Secondary | ICD-10-CM | POA: Diagnosis present

## 2023-07-27 DIAGNOSIS — K5730 Diverticulosis of large intestine without perforation or abscess without bleeding: Secondary | ICD-10-CM | POA: Diagnosis present

## 2023-07-27 HISTORY — DX: Unspecified abdominal pain: R10.9

## 2023-07-27 LAB — COMPREHENSIVE METABOLIC PANEL
ALT: 35 U/L (ref 0–44)
AST: 31 U/L (ref 15–41)
Albumin: 3.8 g/dL (ref 3.5–5.0)
Alkaline Phosphatase: 44 U/L (ref 38–126)
Anion gap: 9 (ref 5–15)
BUN: 13 mg/dL (ref 6–20)
CO2: 24 mmol/L (ref 22–32)
Calcium: 9 mg/dL (ref 8.9–10.3)
Chloride: 101 mmol/L (ref 98–111)
Creatinine, Ser: 0.98 mg/dL (ref 0.61–1.24)
GFR, Estimated: >60 mL/min (ref >60)
Glucose, Bld: 97 mg/dL (ref 70–99)
Potassium: 3.5 mmol/L (ref 3.5–5.1)
Sodium: 134 mmol/L — ABNORMAL LOW (ref 135–145)
Total Bilirubin: 0.9 mg/dL (ref 0.3–1.2)
Total Protein: 7 g/dL (ref 6.5–8.1)

## 2023-07-27 LAB — URINALYSIS, ROUTINE W REFLEX MICROSCOPIC
Bilirubin Urine: NEGATIVE
Glucose, UA: NEGATIVE mg/dL
Hgb urine dipstick: NEGATIVE
Ketones, ur: NEGATIVE mg/dL
Leukocytes,Ua: NEGATIVE
Nitrite: NEGATIVE
Protein, ur: NEGATIVE mg/dL
Specific Gravity, Urine: 1.02 (ref 1.005–1.030)
pH: 6 (ref 5.0–8.0)

## 2023-07-27 LAB — CBC
HCT: 45 % (ref 39.0–52.0)
Hemoglobin: 15.7 g/dL (ref 13.0–17.0)
MCH: 30.8 pg (ref 26.0–34.0)
MCHC: 34.9 g/dL (ref 30.0–36.0)
MCV: 88.4 fL (ref 80.0–100.0)
Platelets: 225 10*3/uL (ref 150–400)
RBC: 5.09 MIL/uL (ref 4.22–5.81)
RDW: 13.1 % (ref 11.5–15.5)
WBC: 10.8 10*3/uL — ABNORMAL HIGH (ref 4.0–10.5)
nRBC: 0 % (ref 0.0–0.2)

## 2023-07-27 LAB — LIPASE, BLOOD: Lipase: 30 U/L (ref 11–51)

## 2023-07-27 MED ORDER — SODIUM CHLORIDE 0.9 % IV BOLUS
1000.0000 mL | Freq: Once | INTRAVENOUS | Status: AC
  Administered 2023-07-27: 1000 mL via INTRAVENOUS

## 2023-07-27 MED ORDER — HYDROMORPHONE HCL 1 MG/ML IJ SOLN
0.5000 mg | INTRAMUSCULAR | Status: DC | PRN
  Administered 2023-07-27 – 2023-07-28 (×3): 0.5 mg via INTRAVENOUS
  Filled 2023-07-27 (×3): qty 0.5

## 2023-07-27 MED ORDER — ENOXAPARIN SODIUM 80 MG/0.8ML IJ SOSY
0.5000 mg/kg | PREFILLED_SYRINGE | INTRAMUSCULAR | Status: DC
  Administered 2023-07-28 (×2): 67.5 mg via SUBCUTANEOUS
  Filled 2023-07-27 (×3): qty 0.68

## 2023-07-27 MED ORDER — TRAZODONE HCL 50 MG PO TABS: 25.0000 mg | ORAL_TABLET | Freq: Every evening | ORAL | Status: DC | PRN

## 2023-07-27 MED ORDER — ONDANSETRON HCL 4 MG/2ML IJ SOLN
4.0000 mg | Freq: Four times a day (QID) | INTRAMUSCULAR | Status: DC | PRN
  Administered 2023-07-28: 4 mg via INTRAVENOUS
  Filled 2023-07-27: qty 2

## 2023-07-27 MED ORDER — ACETAMINOPHEN 325 MG PO TABS: 650.0000 mg | ORAL_TABLET | Freq: Four times a day (QID) | ORAL | Status: DC | PRN

## 2023-07-27 MED ORDER — SPIRONOLACTONE 25 MG PO TABS
25.0000 mg | ORAL_TABLET | Freq: Every day | ORAL | Status: DC
  Administered 2023-07-28 – 2023-07-29 (×2): 25 mg via ORAL
  Filled 2023-07-27 (×2): qty 1

## 2023-07-27 MED ORDER — PANTOPRAZOLE SODIUM 40 MG IV SOLR
40.0000 mg | Freq: Once | INTRAVENOUS | Status: DC
  Filled 2023-07-27: qty 10

## 2023-07-27 MED ORDER — HYDROMORPHONE HCL 1 MG/ML IJ SOLN
1.0000 mg | Freq: Once | INTRAMUSCULAR | Status: AC
  Administered 2023-07-27: 1 mg via INTRAVENOUS
  Filled 2023-07-27: qty 1

## 2023-07-27 MED ORDER — OXYCODONE-ACETAMINOPHEN 5-325 MG PO TABS
1.0000 | ORAL_TABLET | Freq: Four times a day (QID) | ORAL | Status: DC | PRN
  Administered 2023-07-28: 1 via ORAL
  Filled 2023-07-27: qty 1

## 2023-07-27 MED ORDER — INSULIN ASPART 100 UNIT/ML IJ SOLN: 0.0000 [IU] | Freq: Four times a day (QID) | INTRAMUSCULAR | Status: DC

## 2023-07-27 MED ORDER — FUROSEMIDE 20 MG PO TABS: 20.0000 mg | ORAL_TABLET | Freq: Every day | ORAL | Status: DC

## 2023-07-27 MED ORDER — METOCLOPRAMIDE HCL 5 MG/ML IJ SOLN
10.0000 mg | INTRAMUSCULAR | Status: DC
  Filled 2023-07-27: qty 2

## 2023-07-27 MED ORDER — ACETAMINOPHEN 650 MG RE SUPP: 650.0000 mg | Freq: Four times a day (QID) | RECTAL | Status: DC | PRN

## 2023-07-27 MED ORDER — LACTATED RINGERS IV SOLN: INTRAVENOUS | Status: DC

## 2023-07-27 MED ORDER — ONDANSETRON HCL 4 MG PO TABS: 4.0000 mg | ORAL_TABLET | Freq: Four times a day (QID) | ORAL | Status: DC | PRN

## 2023-07-27 MED ORDER — POTASSIUM CHLORIDE 20 MEQ PO PACK
40.0000 meq | PACK | Freq: Once | ORAL | Status: AC
  Administered 2023-07-27: 40 meq via ORAL
  Filled 2023-07-27: qty 2

## 2023-07-27 MED ORDER — SUCRALFATE 1 G PO TABS
1.0000 g | ORAL_TABLET | Freq: Once | ORAL | Status: DC
  Filled 2023-07-27: qty 1

## 2023-07-27 MED ORDER — IRBESARTAN 75 MG PO TABS
37.5000 mg | ORAL_TABLET | Freq: Every day | ORAL | Status: DC
  Administered 2023-07-28 – 2023-07-29 (×2): 37.5 mg via ORAL
  Filled 2023-07-27 (×2): qty 0.5

## 2023-07-27 MED ORDER — POTASSIUM CHLORIDE IN NACL 20-0.9 MEQ/L-% IV SOLN
INTRAVENOUS | Status: DC
  Filled 2023-07-27 (×2): qty 1000

## 2023-07-27 MED ORDER — PANTOPRAZOLE SODIUM 40 MG PO TBEC
40.0000 mg | DELAYED_RELEASE_TABLET | Freq: Every day | ORAL | Status: DC
  Administered 2023-07-28 – 2023-07-29 (×2): 40 mg via ORAL
  Filled 2023-07-27 (×2): qty 1

## 2023-07-27 NOTE — Assessment & Plan Note (Signed)
We will continue CPAP nightly.

## 2023-07-27 NOTE — Progress Notes (Signed)
Anticoagulation monitoring(Lovenox):  57 yo male ordered Lovenox 67.5 mg Q24h    There were no vitals filed for this visit. BMI 41.9    Lab Results  Component Value Date   CREATININE 0.98 07/27/2023   CREATININE 1.05 07/26/2023   CREATININE 0.96 01/08/2023   Estimated Creatinine Clearance: 111.1 mL/min (by C-G formula based on SCr of 0.98 mg/dL). Hemoglobin & Hematocrit     Component Value Date/Time   HGB 15.7 07/27/2023 1948   HCT 45.0 07/27/2023 1948     Per Protocol for Patient with estCrcl > 30 ml/min and BMI > 30, will transition to Lovenox 67.5 mg Q24h.

## 2023-07-27 NOTE — H&P (Signed)
[place]   PATIENT NAME: Evan [name]    MR#:  [id number]  DATE OF BIRTH:  06/27/1966  DATE OF ADMISSION:  07/27/2023  PRIMARY CARE PHYSICIAN: Doreene Nest, NP   Patient is coming from: Home  REQUESTING/REFERRING PHYSICIAN: Alfonse Flavors, MD  CHIEF COMPLAINT:   Chief Complaint  Patient presents with   Abdominal Pain    HISTORY OF PRESENT ILLNESS:  Mykael Depena is a 57 y.o. Caucasian male with medical history significant for hypertension, dyslipidemia and seizure disorder, who presented to the emergency room with acute onset of intractable abdominal pain mainly in the left upper quadrant and left mid abdomen with radiation to his back since Friday.  He has been having intermittent pain for the last year.  He admitted to associated nausea without vomiting.  He had diarrhea Saturday and his last bowel movement was then.  No fever or chills.  No dysuria, oliguria, urinary frequency or urgency or flank pain.  No chest pain or palpitations.  No cough or wheezing or dyspnea.  The patient was seen in the ER yesterday.  His labs then revealed negative CMP and CBC.  Abdominal and pelvic CT scan showed no acute abnormalities.  The patient stated that he had similar symptoms years ago when his workup was negative and he underwent diagnostic laparoscopy that revealed adhesions when he underwent lysis of adhesions.  ED Course: When the patient came to the ER, BP was 148/103 with otherwise normal vital signs.  Later on respiratory rate was 23.  Labs revealed mild hyponatremia 134 borderline potassium of 3.5 compared to 40 yesterday with otherwise unremarkable CMP.  CBC showed WBC of 10.8 compared to 8.2 yesterday. EKG as reviewed by me : None. Imaging: 1 view abdomen x-ray today showed nonobstructive bowel gas pattern. Abdominal pelvic CT scan yesterday showed the following: .1. No acute findings within the abdomen or pelvis. 2. Unchanged, chronic, mild fat stranding  surrounding the distal jejunal mesenteric vessels. This is similar in appearance when compared with 08/09/2022. This is nonspecific but can be seen in the setting of prior mesenteric panniculitis. 3. Distal colonic diverticulosis without signs of acute diverticulitis. 4. Status post left nephrectomy. 5.  Aortic Atherosclerosis.  The patient was given 1 mg of IV Dilaudid and later 0.5 mg IV, and 1 L bolus of IV normal saline.  Dr. Everlene Farrier was notified about the patient and is aware.  The patient will be admitted to a medical bed for further evaluation and management. PAST MEDICAL HISTORY:   Past Medical History:  Diagnosis Date   Arthritis    Chronic cough 01/05/2020   Chronic kidney disease    Colon polyps    benign per pt   Diverticulitis    Hyperlipidemia    Hypertension    Lyme disease    Seizures (HCC)     PAST SURGICAL HISTORY:   Past Surgical History:  Procedure Laterality Date   CARDIAC CATHETERIZATION     CHOLECYSTECTOMY  2021   COLONOSCOPY WITH PROPOFOL N/A 04/24/2022   Procedure: COLONOSCOPY WITH PROPOFOL;  Surgeon: Jeani Hawking, MD;  Location: [facility];  Service: Gastroenterology;  Laterality: N/A;   HERNIA REPAIR     x 2   LEFT HEART CATH AND CORONARY ANGIOGRAPHY N/A 05/04/2018   Procedure: LEFT HEART CATH AND CORONARY ANGIOGRAPHY;  Surgeon: Marykay Lex, MD;  Location: [facility] INVASIVE CV LAB;  Service: Cardiovascular;  Laterality: N/A;   MENISCUS REPAIR     2022 and  2023   NECK SURGERY     NEPHRECTOMY Left 2010   for atrophy and reflux   POLYPECTOMY  04/24/2022   Procedure: POLYPECTOMY;  Surgeon: Jeani Hawking, MD;  Location: [facility];  Service: Gastroenterology;;   PROSTATE BIOPSY     SHOULDER SURGERY Left     SOCIAL HISTORY:   Social History   Tobacco Use   Smoking status: Never   Smokeless tobacco: Never  Substance Use Topics   Alcohol use: Yes    Comment: occasional    FAMILY HISTORY:   Family History  Problem Relation Age of  Onset   Arthritis Mother    Heart disease Mother    Hypertension Mother    Kidney disease Mother    Colon cancer Father        dx in his early 39's   Lung cancer Father    Prostate cancer Father    Breast cancer Father        92 or 1995   Lung cancer Brother        smoker   Lung cancer Brother        smoker   Bladder Cancer Brother    Lung cancer Paternal Aunt    Colon cancer Paternal Uncle    Diabetes Paternal Uncle    Arthritis Maternal Grandmother    Colon cancer Maternal Grandmother    Heart disease Maternal Grandmother    Heart disease Maternal Grandfather    Diabetes Paternal Grandfather     DRUG ALLERGIES:   Allergies  Allergen Reactions   Nuvigil [Armodafinil] Other (See Comments)    Seizures    Seroquel [Quetiapine] Other (See Comments)    Seizures.   Simvastatin Hives    REVIEW OF SYSTEMS:   ROS As per history of present illness. All pertinent systems were reviewed above. Constitutional, HEENT, cardiovascular, respiratory, GI, GU, musculoskeletal, neuro, psychiatric, endocrine, integumentary and hematologic systems were reviewed and are otherwise negative/unremarkable except for positive findings mentioned above in the HPI.   MEDICATIONS AT HOME:   Prior to Admission medications   Medication Sig Start Date End Date Taking? Authorizing Provider  bisacodyl (DULCOLAX) 10 MG suppository Place 1 suppository (10 mg total) rectally as needed for moderate constipation. 07/26/23   Jene Every, MD  docusate sodium (COLACE) 100 MG capsule Take 1 capsule (100 mg total) by mouth 2 (two) times daily. 07/26/23 07/25/24  Jene Every, MD  furosemide (LASIX) 20 MG tablet Take 1 tablet (20mg ) AS NEEDED for wt gain of 3lbs overnight or 5 lbs in one week. 08/11/21   Marisue Ivan D, PA-C  olmesartan (BENICAR) 20 MG tablet TAKE 1 TABLET (20 MG TOTAL) BY MOUTH DAILY. FOR BLOOD PRESSURE. 03/07/23   Doreene Nest, NP  omeprazole (PRILOSEC) 20 MG capsule Take 1 capsule  (20 mg total) by mouth daily. 05/04/18   Danford, Earl Lites, MD  oxyCODONE-acetaminophen (PERCOCET) 5-325 MG tablet Take 1 tablet by mouth every 6 (six) hours as needed for severe pain. 07/26/23 07/25/24  Jene Every, MD  polyethylene glycol (MIRALAX) 17 g packet Take 17 g by mouth daily. 07/26/23   Jene Every, MD  potassium chloride (KLOR-CON) 10 MEQ tablet Take 1 tablet (10 mEq total) by mouth as directed. Take one tablet only when take Lasix. Patient taking differently: Take 10 mEq by mouth as needed. Take one tablet only when take Lasix. 08/11/21   Marisue Ivan D, PA-C  spironolactone (ALDACTONE) 25 MG tablet Take 1 tablet (25 mg total) by mouth  daily. for blood pressure. 01/12/23   Doreene Nest, NP      VITAL SIGNS:  Blood pressure 137/61, pulse 72, temperature 98.3 F (36.8 C), temperature source Oral, resp. rate (!) 22, SpO2 95%.  PHYSICAL EXAMINATION:  Physical Exam  GENERAL:  57 y.o.-year-old Caucasian male patient lying in the bed with no acute distress.  EYES: Pupils equal, round, reactive to light and accommodation. No scleral icterus. Extraocular muscles intact.  HEENT: Head atraumatic, normocephalic. Oropharynx and nasopharynx clear.  NECK:  Supple, no jugular venous distention. No thyroid enlargement, no tenderness.  LUNGS: Normal breath sounds bilaterally, no wheezing, rales,rhonchi or crepitation. No use of accessory muscles of respiration.  CARDIOVASCULAR: Regular rate and rhythm, S1, S2 normal. No murmurs, rubs, or gallops.  ABDOMEN: Soft, nondistended, with epigastric and left upper quadrant as well as mid abdominal tenderness without rebound tenderness guarding or rigidity.  Bowel sounds present. No organomegaly or mass.  EXTREMITIES: No pedal edema, cyanosis, or clubbing.  NEUROLOGIC: Cranial nerves II through XII are intact. Muscle strength 5/5 in all extremities. Sensation intact. Gait not checked.  PSYCHIATRIC: The patient is alert and oriented x 3.   Normal affect and good eye contact. SKIN: No obvious rash, lesion, or ulcer.   LABORATORY PANEL:   CBC Recent Labs  Lab 07/27/23 1948  WBC 10.8*  HGB 15.7  HCT 45.0  PLT 225   ------------------------------------------------------------------------------------------------------------------  Chemistries  Recent Labs  Lab 07/27/23 1948  NA 134*  K 3.5  CL 101  CO2 24  GLUCOSE 97  BUN 13  CREATININE 0.98  CALCIUM 9.0  AST 31  ALT 35  ALKPHOS 44  BILITOT 0.9   ------------------------------------------------------------------------------------------------------------------  Cardiac Enzymes No results for input(s): "TROPONINI" in the last 168 hours. ------------------------------------------------------------------------------------------------------------------  RADIOLOGY:  DG Abdomen 1 View  Result Date: 07/27/2023 CLINICAL DATA:  Left side abd pain EXAM: ABDOMEN - 1 VIEW COMPARISON:  CT abdomen pelvis 07/26/2023 FINDINGS: The bowel gas pattern is normal. Stool throughout ascending, transverse, rectosigmoid colon. Right upper quadrant surgical clips. No radio-opaque calculi or other significant radiographic abnormality are seen. IMPRESSION: Nonobstructive bowel gas pattern. Electronically Signed   By: Tish Frederickson M.D.   On: 07/27/2023 22:07   CT ABDOMEN PELVIS W CONTRAST  Result Date: 07/26/2023 CLINICAL DATA:  Left upper quadrant abdominal pain. Status post left nephrectomy. EXAM: CT ABDOMEN AND PELVIS WITH CONTRAST TECHNIQUE: Multidetector CT imaging of the abdomen and pelvis was performed using the standard protocol following bolus administration of intravenous contrast. RADIATION DOSE REDUCTION: This exam was performed according to the departmental dose-optimization program which includes automated exposure control, adjustment of the mA and/or kV according to patient size and/or use of iterative reconstruction technique. CONTRAST:  OMNIPAQUE IOHEXOL 300 MG/ML   SOLN COMPARISON:  04/09/2022. FINDINGS: Lower chest: No acute abnormality. Hepatobiliary: No focal liver abnormality is seen. Status post cholecystectomy. No biliary dilatation. Pancreas: Unremarkable. No pancreatic ductal dilatation or surrounding inflammatory changes. Spleen: Normal in size without focal abnormality. Adrenals/Urinary Tract: Normal adrenal glands. Status post left nephrectomy. Right kidney appears normal. No right-sided nephrolithiasis, hydronephrosis or suspicious mass. Urinary bladder is unremarkable. Stomach/Bowel: Stomach appears normal. The appendix is visualized and appears normal. No pathologic dilatation of the large or small bowel loops. Distal colonic diverticulosis noted without signs of acute diverticulitis. Vascular/Lymphatic: Mild aortic atherosclerosis. No aneurysm. Chronic, mild fat stranding surrounding the distal jejunal mesenteric vessels, image 51/2. This is similar in appearance when compared with 08/09/2022. No mesenteric adenopathy. No retroperitoneal or  pelvic adenopathy Reproductive: Prostate is unremarkable. Other: No ascites or focal fluid collections. No signs of pneumoperitoneum. Previous left ventral abdominal wall hernia mesh placement, image 65/2 Musculoskeletal: No acute or significant osseous findings. Degenerative disc disease noted within the lumbar spine. IMPRESSION: 1. No acute findings within the abdomen or pelvis. 2. Unchanged, chronic, mild fat stranding surrounding the distal jejunal mesenteric vessels. This is similar in appearance when compared with 08/09/2022. This is nonspecific but can be seen in the setting of prior mesenteric panniculitis. 3. Distal colonic diverticulosis without signs of acute diverticulitis. 4. Status post left nephrectomy. 5.  Aortic Atherosclerosis (ICD10-I70.0). Electronically Signed   By: Signa Kell M.D.   On: 07/26/2023 09:09      IMPRESSION AND PLAN:  Assessment and Plan: * Intractable abdominal pain - This could  be related to recurrent abdominal adhesions. - He will be admitted to medical bed. - Pain management will be provided. - We will keep him n.p.o. except for his medications. - Will be hydrated with IV normal saline with added potassium chloride for borderline potassium of 3.5. - General Surgery consult to be obtained. - Dr. Everlene Farrier was notified about the patient. - She just had an abdominal and pelvic CT scan yesterday and therefore I will not repeated. - He had a nonobstructive bowel gas pattern on 1 view abdomen x-ray today.  Essential hypertension - We will continue his antihypertensive therapy.  Hyponatremia - This is fairly mild. - It is likely hypovolemic. - The patient will be hydrated as mentioned above and will follow BMP.  Type 2 diabetes mellitus without complications (HCC) - The will be placed on supplemental coverage with NovoLog.  GERD without esophagitis - We will continue PPI therapy.  OSA (obstructive sleep apnea) - We will continue CPAP nightly.   DVT prophylaxis: Lovenox. Advanced Care Planning:  Code Status: full code. Family Communication:  The plan of care was discussed in details with the patient (and family). I answered all questions. The patient agreed to proceed with the above mentioned plan. Further management will depend upon hospital course. Disposition Plan: Back to previous home environment Consults called: General Surgery. All the records are reviewed and case discussed with ED provider.  Status is: Inpatient   At the time of the admission, it appears that the appropriate admission status for this patient is inpatient.  This is judged to be reasonable and necessary in order to provide the required intensity of service to ensure the patient's safety given the presenting symptoms, physical exam findings and initial radiographic and laboratory data in the context of comorbid conditions.  The patient requires inpatient status due to high intensity of  service, high risk of further deterioration and high frequency of surveillance required.  I certify that at the time of admission, it is my clinical judgment that the patient will require inpatient hospital care extending more than 2 midnights.                            Dispo: The patient is from: Home              Anticipated d/c is to: Home              Patient currently is not medically stable to d/c.              Difficult to place patient: No  Hannah Beat M.D on 07/27/2023 at 10:34 PM  [facility]  From 7 PM-7 AM, contact night-coverage [email or web address]  CC: Primary care physician; Doreene Nest, NP

## 2023-07-27 NOTE — ED Triage Notes (Signed)
Pt presents to ED with c/o of ABD pain that has been intermittent in nature and states HX of adhesions. Pt states seen for same yesterday and states he was offered to be admitted for pain control and decided to take PO pain meds and trail at home. Pt states blood work done yesterday and a CT scan. Pt states last BM was this past Saturday and states his stool was loose.   Pt requests no blood wok at this time due to it being done yesterday.

## 2023-07-27 NOTE — ED Provider Notes (Signed)
[facility] Provider Note    Event Date/Time   First MD Initiated Contact with Patient 07/27/23 1902     (approximate)   History   Chief Complaint: Abdominal Pain   HPI  Evan [name] is a 57 y.o. male with a history of hypertension, CKD status post left nephrectomy, multiple prior abdominal surgeries who comes ED complaining of severe left sided abdominal pain which has been worsening for the past 4 days.  Waxing and waning, severe, radiating around to the back.  He was seen yesterday in the ED, had reassuring workup with labs and CT scan with IV contrast, discharged home with oral oxycodone.  He reports since then, pain has continued worsening and he is now having belching and p.o. intolerance.  Taking oxycodone is not adequately managing his symptoms.  He notes that in 2014 while in [place] he had similar symptoms, workup was nondiagnostic, so he underwent diagnostic laparoscopy which revealed extensive adhesive disease.  He underwent lysis of adhesions with improvement of his symptoms.  Patient also reports last monitoring that was 4 days ago and he has not passed gas in 2 days.  Current symptoms feel identical to that episode.  He is on Nexium for chronic GERD, and has been compliant with this medication.     Physical Exam   Triage Vital Signs: ED Triage Vitals [07/27/23 1531]  Encounter Vitals Group     BP (!) 148/103     Systolic BP Percentile      Diastolic BP Percentile      Pulse Rate 71     Resp 18     Temp 98.2 F (36.8 C)     Temp Source Oral     SpO2 95 %     Weight      Height      Head Circumference      Peak Flow      Pain Score 10     Pain Loc      Pain Education      Exclude from Growth Chart     Most recent vital signs: Vitals:   07/27/23 2008 07/27/23 2030  BP:  137/61  Pulse:  72  Resp:  (!) 22  Temp: 98.3 F (36.8 C)   SpO2:  95%    General: Awake, no distress.  CV:  Good peripheral perfusion.  Regular rate  rhythm Resp:  Normal effort.  Clear to auscultation bilaterally Abd:  No distention.  Mild tenderness along the left abdomen.  No hernia.  No peritonitis. Other:  No rash, no icterus.   ED Results / Procedures / Treatments   Labs (all labs ordered are listed, but only abnormal results are displayed) Labs Reviewed  COMPREHENSIVE METABOLIC PANEL - Abnormal; Notable for the following components:      Result Value   Sodium 134 (*)    All other components within normal limits  CBC - Abnormal; Notable for the following components:   WBC 10.8 (*)    All other components within normal limits  LIPASE, BLOOD  URINALYSIS, ROUTINE W REFLEX MICROSCOPIC     EKG    RADIOLOGY X-ray KUB shows stool and air throughout the colon without evidence of obstruction   PROCEDURES:  Procedures   MEDICATIONS ORDERED IN ED: Medications  HYDROmorphone (DILAUDID) injection 0.5 mg (has no administration in time range)  lactated ringers infusion (has no administration in time range)  sodium chloride 0.9 % bolus 1,000 mL (0 mLs Intravenous Stopped  07/27/23 2146)  HYDROmorphone (DILAUDID) injection 1 mg (1 mg Intravenous Given 07/27/23 1948)     IMPRESSION / MDM / ASSESSMENT AND PLAN / ED COURSE  I reviewed the triage vital signs and the nursing notes.  DDx: Ileus, bowel obstruction, intraperitoneal adhesive disease, gastritis  Patient's presentation is most consistent with acute presentation with potential threat to life or bodily function.  Patient presents with intractable abdominal pain.  Had reassuring workup yesterday, but management of pain with oral oxycodone at home was inadequate and he is continue to have worsening symptoms and declining ability to function in his daily life because of it.  Returns today, had moderate improvement with IV Dilaudid.  Will continue Dilaudid.  Notified surgery on call who will arrange for inpatient consult.  Discussed with hospitalist for further  management.       FINAL CLINICAL IMPRESSION(S) / ED DIAGNOSES   Final diagnoses:  Intractable abdominal pain     Rx / DC Orders   ED Discharge Orders     None        Note:  This document was prepared using Dragon voice recognition software and may include unintentional dictation errors.   Sharman Cheek, MD 07/27/23 2209

## 2023-07-27 NOTE — Assessment & Plan Note (Signed)
-   This is fairly mild. - It is likely hypovolemic. - The patient will be hydrated as mentioned above and will follow BMP.

## 2023-07-27 NOTE — Transitions of Care (Post Inpatient/ED Visit) (Signed)
07/27/2023  Name: Evan [name] MRN: [id number] DOB: 1966-02-25  Today's TOC FU Call Status: Today's TOC FU Call Status:: Successful TOC FU Call Completed TOC FU Call Complete Date: 07/27/23  Transition Care Management Follow-up Telephone Call Date of Discharge: 07/26/23 Discharge Facility: [facility]) Type of Discharge: Emergency Department Reason for ED Visit: Other: (Left upper quadrant abdominal pain) How have you been since you were released from the hospital?: Better Any questions or concerns?: Yes Patient Questions/Concerns:: pt is in severe pain - called the surgeon and was told to go back to the ER  Items Reviewed: Did you receive and understand the discharge instructions provided?: Yes Medications obtained,verified, and reconciled?: Yes (Medications Reviewed) Any new allergies since your discharge?: No Dietary orders reviewed?: Yes Do you have support at home?: Yes  Medications Reviewed Today: Medications Reviewed Today     Reviewed by Merleen Nicely, LPN (Licensed Practical Nurse) on 07/27/23 at 1449  Med List Status: <None>   Medication Order Taking? Sig Documenting Provider Last Dose Status Informant  bisacodyl (DULCOLAX) 10 MG suppository [id number] Yes Place 1 suppository (10 mg total) rectally as needed for moderate constipation. Jene Every, MD Taking Active   docusate sodium (COLACE) 100 MG capsule [id number] Yes Take 1 capsule (100 mg total) by mouth 2 (two) times daily. Jene Every, MD Taking Active   furosemide (LASIX) 20 MG tablet [id number] Yes Take 1 tablet (20mg ) AS NEEDED for wt gain of 3lbs overnight or 5 lbs in one week. Marisue Ivan D, PA-C Taking Active Self  olmesartan (BENICAR) 20 MG tablet [id number] Yes TAKE 1 TABLET (20 MG TOTAL) BY MOUTH DAILY. FOR BLOOD PRESSURE. Doreene Nest, NP Taking Active   omeprazole (PRILOSEC) 20 MG capsule [id number] Yes Take 1 capsule (20 mg total) by mouth daily. Alberteen Sam, MD Taking Active Self  oxyCODONE-acetaminophen (PERCOCET) 5-325 MG tablet [id number] Yes Take 1 tablet by mouth every 6 (six) hours as needed for severe pain. Jene Every, MD Taking Active   polyethylene glycol (MIRALAX) 17 g packet [id number] Yes Take 17 g by mouth daily. Jene Every, MD Taking Active   potassium chloride (KLOR-CON) 10 MEQ tablet [id number] Yes Take 1 tablet (10 mEq total) by mouth as directed. Take one tablet only when take Lasix.  Patient taking differently: Take 10 mEq by mouth as needed. Take one tablet only when take Lasix.   Marisue Ivan D, PA-C Taking Active Self  spironolactone (ALDACTONE) 25 MG tablet [id number] Yes Take 1 tablet (25 mg total) by mouth daily. for blood pressure. Doreene Nest, NP Taking Active             Home Care and Equipment/Supplies: Were Home Health Services Ordered?: NA Any new equipment or medical supplies ordered?: NA  Functional Questionnaire: Do you need assistance with bathing/showering or dressing?: No Do you need assistance with meal preparation?: No Do you need assistance with eating?: No Do you have difficulty maintaining continence: No Do you need assistance with getting out of bed/getting out of a chair/moving?: No Do you have difficulty managing or taking your medications?: No  Follow up appointments reviewed: PCP Follow-up appointment confirmed?: No MD Provider Line Number:[phone] Given: Yes Specialist Hospital Follow-up appointment confirmed?: Yes Date of Specialist follow-up appointment?: 07/29/23 Follow-Up Specialty Provider:: Dr Claudine Mouton Do you need transportation to your follow-up appointment?: No Do you understand care options if your condition(s) worsen?: Yes-patient verbalized understanding    SIGNATURE  Woodfin Ganja LPN [facility]  Chief Financial Officer [phone]

## 2023-07-27 NOTE — Assessment & Plan Note (Signed)
-   We will continue PPI therapy 

## 2023-07-27 NOTE — Assessment & Plan Note (Signed)
-   The will be placed on supplemental coverage with NovoLog.

## 2023-07-27 NOTE — Assessment & Plan Note (Addendum)
-   This could be related to recurrent abdominal adhesions. - He will be admitted to medical bed. - Pain management will be provided. - We will keep him n.p.o. except for his medications. - Will be hydrated with IV normal saline with added potassium chloride for borderline potassium of 3.5. - General Surgery consult to be obtained. - Dr. Everlene Farrier was notified about the patient. - She just had an abdominal and pelvic CT scan yesterday and therefore I will not repeated. - He had a nonobstructive bowel gas pattern on 1 view abdomen x-ray today.

## 2023-07-27 NOTE — Assessment & Plan Note (Signed)
-   We will continue his antihypertensive therapy. 

## 2023-07-28 ENCOUNTER — Inpatient Hospital Stay: Payer: BLUE CROSS/BLUE SHIELD

## 2023-07-28 ENCOUNTER — Encounter: Payer: Self-pay | Admitting: Family Medicine

## 2023-07-28 DIAGNOSIS — R109 Unspecified abdominal pain: Secondary | ICD-10-CM | POA: Diagnosis not present

## 2023-07-28 LAB — GLUCOSE, CAPILLARY
Glucose-Capillary: 106 mg/dL — ABNORMAL HIGH (ref 70–99)
Glucose-Capillary: 118 mg/dL — ABNORMAL HIGH (ref 70–99)
Glucose-Capillary: 103 mg/dL — ABNORMAL HIGH (ref 70–99)
Glucose-Capillary: 93 mg/dL (ref 70–99)

## 2023-07-28 LAB — HIV ANTIBODY (ROUTINE TESTING W REFLEX): HIV Screen 4th Generation wRfx: NONREACTIVE

## 2023-07-28 MED ORDER — LACTATED RINGERS IV SOLN: INTRAVENOUS | Status: AC

## 2023-07-28 MED ORDER — IOHEXOL 300 MG/ML  SOLN
100.0000 mL | Freq: Once | INTRAMUSCULAR | Status: AC | PRN
  Administered 2023-07-28: 100 mL via INTRAVENOUS

## 2023-07-28 MED ORDER — BARIUM SULFATE 0.1 % PO SUSP
450.0000 mL | ORAL | Status: AC
  Administered 2023-07-28 (×3): 450 mL via ORAL

## 2023-07-28 NOTE — TOC CM/SW Note (Signed)
Transition of Care [facility]) - Inpatient Brief Assessment   Patient Details  Name: Deaire Clennon MRN: [id number] Date of Birth: Jan 11, 1966  Transition of Care [facility]) CM/SW Contact:    Chapman Fitch, RN Phone Number: 07/28/2023, 11:24 AM   Clinical Narrative:   Transition of Care [facility]) Screening Note   Patient Details  Name: Ramel Rodela Date of Birth: 1966-04-30   Transition of Care [facility]) CM/SW Contact:    Chapman Fitch, RN Phone Number: 07/28/2023, 11:24 AM    Transition of Care Department [facility]) has reviewed patient and no TOC needs have been identified at this time. We will continue to monitor patient advancement through interdisciplinary progression rounds. If new patient transition needs arise, please place a TOC consult.     Transition of Care Asessment: Insurance and Status: Insurance coverage has been reviewed Patient has primary care physician: Yes     Prior/Current Home Services: No current home services Social Determinants of Health Reivew: SDOH reviewed no interventions necessary Readmission risk has been reviewed: Yes Transition of care needs: no transition of care needs at this time

## 2023-07-28 NOTE — Consult Note (Signed)
[facility] SURGICAL CONSULTATION NOTE (initial) - cpt: 45409   HISTORY OF PRESENT ILLNESS (HPI):  57 y.o. male presented to [facility] ED yesterday for evaluation of abdominal pain. Patient reports a quite significant history of previous abdominal pain and abdominal surgeries in the past. He has history of hand assisted laparoscopic left nephrectomy in 2010 complicated by incisional hernia and hand assist port repaired in 2011 with mesh. IN 2014, he developed intractable left sided abdominal pain. He underwent extensive work up with findings. He ultimately had diagnostic laparoscopy and lysis of adhesions and was told he had "significant adhesion burden." His pain improved following this. He has also had umbilical hernia repair and laparoscopic cholecystectomy in the interim. He presented initially to the ED on 08/12 given multiple episodes of severe left sided abdominal pain which radiated to his back. He is unable to get comfortable and this was keeping him from work. When the pain is severe enough he endorses severe belching and inability to tolerate PO. He is having bowel function intermittently. No feer, chills, cough, CP, SOB, urinary changes. His work up on 08/12 was reassuring and he was given PO analgesic and referred to our clinic. Unfortunately, his pain continued and he presented to the ED last night. Work up in the ED revealed a mild leukocytosis to 10.8K (now 7.5K), Hgb to 15.7, renal function normal with sCr - 0.98, no electrolyte derangements. He did have CT Abdomen/Pelvis (08/12) without acute intra-abdominal findings. Given his intractable pain, he was admitted to the medicine service.   Surgery is consulted by emergency medicine physician Dr. Sharman Cheek, MD in this context for evaluation and management of intractable left sided abdominal pain.  PAST MEDICAL HISTORY (PMH):  Past Medical History:  Diagnosis Date   Arthritis    Chronic cough 01/05/2020   Chronic kidney  disease    Colon polyps    benign per pt   Diverticulitis    Hyperlipidemia    Hypertension    Lyme disease    Seizures (HCC)      PAST SURGICAL HISTORY (PSH):  Past Surgical History:  Procedure Laterality Date   CARDIAC CATHETERIZATION     CHOLECYSTECTOMY  2021   COLONOSCOPY WITH PROPOFOL N/A 04/24/2022   Procedure: COLONOSCOPY WITH PROPOFOL;  Surgeon: Jeani Hawking, MD;  Location: [facility] ENDOSCOPY;  Service: Gastroenterology;  Laterality: N/A;   HERNIA REPAIR     x 2   LEFT HEART CATH AND CORONARY ANGIOGRAPHY N/A 05/04/2018   Procedure: LEFT HEART CATH AND CORONARY ANGIOGRAPHY;  Surgeon: Marykay Lex, MD;  Location: [facility] INVASIVE CV LAB;  Service: Cardiovascular;  Laterality: N/A;   MENISCUS REPAIR     2022 and 2023   NECK SURGERY     NEPHRECTOMY Left 2010   for atrophy and reflux   POLYPECTOMY  04/24/2022   Procedure: POLYPECTOMY;  Surgeon: Jeani Hawking, MD;  Location: [facility];  Service: Gastroenterology;;   PROSTATE BIOPSY     SHOULDER SURGERY Left      MEDICATIONS:  Prior to Admission medications   Medication Sig Start Date End Date Taking? Authorizing Provider  docusate sodium (COLACE) 100 MG capsule Take 1 capsule (100 mg total) by mouth 2 (two) times daily. 07/26/23 07/25/24 Yes Jene Every, MD  furosemide (LASIX) 20 MG tablet Take 1 tablet (20mg ) AS NEEDED for wt gain of 3lbs overnight or 5 lbs in one week. 08/11/21  Yes Visser, Jacquelyn D, PA-C  olmesartan (BENICAR) 20 MG tablet TAKE 1 TABLET (20 MG  TOTAL) BY MOUTH DAILY. FOR BLOOD PRESSURE. 03/07/23  Yes Doreene Nest, NP  omeprazole (PRILOSEC) 20 MG capsule Take 1 capsule (20 mg total) by mouth daily. 05/04/18  Yes Danford, Earl Lites, MD  oxyCODONE-acetaminophen (PERCOCET) 5-325 MG tablet Take 1 tablet by mouth every 6 (six) hours as needed for severe pain. 07/26/23 07/25/24 Yes Jene Every, MD  polyethylene glycol (MIRALAX) 17 g packet Take 17 g by mouth daily. 07/26/23  Yes Jene Every, MD   potassium chloride (KLOR-CON) 10 MEQ tablet Take 1 tablet (10 mEq total) by mouth as directed. Take one tablet only when take Lasix. Patient taking differently: Take 10 mEq by mouth as needed. Take one tablet only when take Lasix. 08/11/21  Yes Marisue Ivan D, PA-C  spironolactone (ALDACTONE) 25 MG tablet Take 1 tablet (25 mg total) by mouth daily. for blood pressure. 01/12/23  Yes Doreene Nest, NP  bisacodyl (DULCOLAX) 10 MG suppository Place 1 suppository (10 mg total) rectally as needed for moderate constipation. Patient not taking: Reported on 07/27/2023 07/26/23   Jene Every, MD     ALLERGIES:  Allergies  Allergen Reactions   Nuvigil [Armodafinil] Other (See Comments)    Seizures    Seroquel [Quetiapine] Other (See Comments)    Seizures.   Simvastatin Hives     SOCIAL HISTORY:  Social History   Socioeconomic History   Marital status: Married    Spouse name: Not on file   Number of children: 4   Years of education: Not on file   Highest education level: Not on file  Occupational History   Occupation: Production designer, theatre/television/film  Tobacco Use   Smoking status: Never   Smokeless tobacco: Never  Vaping Use   Vaping status: Never Used  Substance and Sexual Activity   Alcohol use: Yes    Comment: occasional   Drug use: No   Sexual activity: Not on file  Other Topics Concern   Not on file  Social History Narrative   Not on file   Social Determinants of Health   Financial Resource Strain: Not on file  Food Insecurity: No Food Insecurity (07/27/2023)   Hunger Vital Sign    Worried About Running Out of Food in the Last Year: Never true    Ran Out of Food in the Last Year: Never true  Transportation Needs: No Transportation Needs (07/27/2023)   PRAPARE - Administrator, Civil Service (Medical): No    Lack of Transportation (Non-Medical): No  Physical Activity: Not on file  Stress: Not on file  Social Connections: Unknown (04/28/2022)   Received from [facility]    Social Network    Social Network: Not on file  Intimate Partner Violence: Not At Risk (07/27/2023)   Humiliation, Afraid, Rape, and Kick questionnaire    Fear of Current or Ex-Partner: No    Emotionally Abused: No    Physically Abused: No    Sexually Abused: No     FAMILY HISTORY:  Family History  Problem Relation Age of Onset   Arthritis Mother    Heart disease Mother    Hypertension Mother    Kidney disease Mother    Colon cancer Father        dx in his early 58's   Lung cancer Father    Prostate cancer Father    Breast cancer Father        77 or 60   Lung cancer Brother        smoker  Lung cancer Brother        smoker   Bladder Cancer Brother    Lung cancer Paternal Aunt    Colon cancer Paternal Uncle    Diabetes Paternal Uncle    Arthritis Maternal Grandmother    Colon cancer Maternal Grandmother    Heart disease Maternal Grandmother    Heart disease Maternal Grandfather    Diabetes Paternal Grandfather       REVIEW OF SYSTEMS:  Review of Systems  Constitutional:  Negative for chills and fever.  Respiratory:  Negative for cough and shortness of breath.   Cardiovascular:  Negative for chest pain and palpitations.  Gastrointestinal:  Positive for abdominal pain and nausea. Negative for constipation, diarrhea and vomiting.  Genitourinary:  Negative for dysuria and urgency.  All other systems reviewed and are negative.   VITAL SIGNS:  Temp:  [97.5 F (36.4 C)-98.3 F (36.8 C)] 97.8 F (36.6 C) (08/14 0431) Pulse Rate:  [59-72] 64 (08/14 0431) Resp:  [16-22] 18 (08/14 0431) BP: (116-148)/(61-103) 116/79 (08/14 0431) SpO2:  [93 %-95 %] 94 % (08/14 0431)             INTAKE/OUTPUT:  08/13 0701 - 08/14 0700 In: 1000 [IV Piggyback:1000] Out: -   PHYSICAL EXAM:  Physical Exam Vitals and nursing note reviewed. Exam conducted with a chaperone present.  Constitutional:      General: He is not in acute distress.    Appearance: He is obese. He is not  ill-appearing.     Comments: Patient resting in bed; NAD  HENT:     Head: Normocephalic and atraumatic.  Eyes:     General: No scleral icterus.    Extraocular Movements: Extraocular movements intact.  Cardiovascular:     Rate and Rhythm: Normal rate.     Heart sounds: Normal heart sounds. No murmur heard. Pulmonary:     Effort: Pulmonary effort is normal. No respiratory distress.  Abdominal:     General: Abdomen is protuberant. A surgical scar is present. There is no distension.     Palpations: Abdomen is soft.     Tenderness: There is abdominal tenderness in the left upper quadrant and left lower quadrant. There is no rebound.     Hernia: No hernia is present.     Comments: Abdomen is soft, he reports soreness in left abdomen worse over previous hand assist port in left mid-abdomen, no appreciable hernias, non-distended, no rebound/guarding. He is certainly without peritonitis. Previous abdominal scars noted.   Genitourinary:    Comments: Deferred Skin:    General: Skin is warm and dry.     Coloration: Skin is not jaundiced.     Findings: No erythema.  Neurological:     General: No focal deficit present.     Mental Status: He is alert and oriented to person, place, and time.  Psychiatric:        Mood and Affect: Mood normal.        Behavior: Behavior normal.      Labs:     Latest Ref Rng & Units 07/28/2023    5:26 AM 07/27/2023    7:48 PM 07/26/2023    7:14 AM  CBC  WBC 4.0 - 10.5 K/uL 7.5  10.8  8.2   Hemoglobin 13.0 - 17.0 g/dL 10.2  72.5  36.6   Hematocrit 39.0 - 52.0 % 42.1  45.0  47.5   Platelets 150 - 400 K/uL 213  225  253  Latest Ref Rng & Units 07/28/2023    5:26 AM 07/27/2023    7:48 PM 07/26/2023    7:14 AM  CMP  Glucose 70 - 99 mg/dL 409  97  811   BUN 6 - 20 mg/dL 13  13  14    Creatinine 0.61 - 1.24 mg/dL 9.14  7.82  9.56   Sodium 135 - 145 mmol/L 136  134  137   Potassium 3.5 - 5.1 mmol/L 4.0  3.5  4.0   Chloride 98 - 111 mmol/L 105  101  105    CO2 22 - 32 mmol/L 24  24  23    Calcium 8.9 - 10.3 mg/dL 8.4  9.0  9.2   Total Protein 6.5 - 8.1 g/dL  7.0  7.5   Total Bilirubin 0.3 - 1.2 mg/dL  0.9  1.1   Alkaline Phos 38 - 126 U/L  44  47   AST 15 - 41 U/L  31  37   ALT 0 - 44 U/L  35  37     Imaging studies:   CT Abdomen/Pelvis (07/26/2023) personally reviewed without evidence of bowel obstruction, pneumatosis, free air, and radiologist report reviewed below:  IMPRESSION: 1. No acute findings within the abdomen or pelvis. 2. Unchanged, chronic, mild fat stranding surrounding the distal jejunal mesenteric vessels. This is similar in appearance when compared with 08/09/2022. This is nonspecific but can be seen in the setting of prior mesenteric panniculitis. 3. Distal colonic diverticulosis without signs of acute diverticulitis. 4. Status post left nephrectomy. 5.  Aortic Atherosclerosis (ICD10-I70.0).   Assessment/Plan: (ICD-10's: R10.9) 57 y.o. male with intractable left sided abdominal pain.   - Appreciate medicine admission - Unfortunately, there is no obvious surgical etiology to his left sided abdominal pain based on available work up. No evidence of bowel obstruction, free air, abscess, nor bowel compromise. He is clinically feeling better this morning. Although he notes history of lysis of adhesions in 2014 with similar presentation, there is no evidence of obstruction presently. That is not to say he could not have adhesions still but he understands imaging will not evaluate this well. He does have what appear to be thickening of peritoneum in the left abdomen but suspect this may be sequela of previous hernia repair at that site. No evidence of mesh nor mesh infection. For now, I do NOT think he warrants any emergent surgical intervention. Will ultimately defer to Dr Everlene Farrier regarding repeating diagnostic laparoscopy but this would be if we have exhausted all other diagnostic measures.   - NPO for now; If no plan for  procedures I think it is reasonable to resume diet.  - Monitor abdominal examination; on-going bowel function - Pain control prn; antiemetics prn   - Mobilize as tolerated   - Further management per primary service; we will follow   All of the above findings and recommendations were discussed with the patient, and all of patient's questions were answered to his expressed satisfaction.  Thank you for the opportunity to participate in this patient's care.   -- Lynden Oxford, PA-C [facility] 07/28/2023, 7:15 AM M-F: 7am - 4pm

## 2023-07-28 NOTE — Plan of Care (Signed)
  Problem: Coping: Goal: Ability to adjust to condition or change in health will improve Outcome: Progressing   Problem: Fluid Volume: Goal: Ability to maintain a balanced intake and output will improve Outcome: Progressing   Problem: Nutritional: Goal: Maintenance of adequate nutrition will improve Outcome: Progressing   

## 2023-07-28 NOTE — Progress Notes (Signed)
       CROSS COVER NOTE  NAME: Evan [name] MRN: [id number] DOB : Jul 10, 1966    Concern as stated by nurse / staff   Pt in [address] [place] in with abdominal pain, he is has been NPO since admission and is asking for something to eat. His CT this afternoon showed no acute findings and surgery has cleared him from any surgical procedures.      Pertinent findings on chart review: Chart reviewed CT enterography without explanation of his abdominal pain. No surgical intervention planned at this time  Assessment and  Interventions   Assessment:    07/28/2023    7:23 PM 07/28/2023    2:56 PM 07/28/2023    7:30 AM  Vitals with BMI  Systolic 130 133 657  Diastolic 76 73 81  Pulse 72 70 81    Plan: Start clear liquids and monitor his abdominal pain X X      Evan Mesa NP [facility] Cross Cover 7pm-7am - check amion for availability Pager [phone]

## 2023-07-28 NOTE — Progress Notes (Signed)
Progress Note    Evan [name]  QMV:[id number] DOB: Feb 18, 1966  DOA: 07/27/2023 PCP: Doreene Nest, NP      Brief Narrative:    Medical records reviewed and are as summarized below:  Evan [name] is a 57 y.o. male with medical history significant for hypertension, dyslipidemia, seizure disorder, multiple abdominal surgeries (including left nephrectomy, cholecystectomy, hernia repair) who presented to the hospital because of nausea, vomiting, intractable left-sided and mid abdominal pain with radiation to the left side of his back.         Assessment/Plan:   Principal Problem:   Intractable abdominal pain Active Problems:   Essential hypertension   Type 2 diabetes mellitus without complications (HCC)   Hyponatremia   OSA (obstructive sleep apnea)   GERD without esophagitis   Abdominal pain, history of abdominal surgeries including lysis of adhesions: Etiology is not clear at this time.  He is n.p.o. for now.  Restart IV fluids because of n.p.o. status.  Analgesics as needed for pain.  Antiemetics as needed.  Follow-up with general surgeon. Of note, no acute abnormality on abdominal x-ray and CT abdomen pelvis.   Hypertension: Continue irbesartan and Aldactone.  He said he has not taking Lasix for about 2 years now.  Lasix was only prescribed in the past for leg edema as needed.   Type II DM: NovoLog as needed for hyperglycemia.   Other comorbidities include OSA on CPAP, GERD, morbid obesity (BMI 41.9)  Diet Order             Diet NPO time specified Except for: Sips with Meds  Diet effective now                            Consultants: General surgeon  Procedures: None    Medications:    enoxaparin (LOVENOX) injection  0.5 mg/kg Subcutaneous Q24H   insulin aspart  0-9 Units Subcutaneous Q6H   irbesartan  37.5 mg Oral Daily   pantoprazole  40 mg Oral Daily   spironolactone  25 mg Oral Daily   Continuous Infusions:  lactated  ringers       Anti-infectives (From admission, onward)    None              Family Communication/Anticipated D/C date and plan/Code Status   DVT prophylaxis:      Code Status: Full Code  Family Communication: None Disposition Plan: Plan to discharge home in 1 to 2 days   Status is: Inpatient Remains inpatient appropriate because: Abdominal pain       Subjective:   Interval events noted.  He has passed "wind".  He complains of nausea and left-sided abdominal pain.  No vomiting.  Last bowel movement was 2 days prior to admission.  Objective:    Vitals:   07/27/23 2030 07/27/23 2259 07/28/23 0431 07/28/23 0730  BP: 137/61 119/69 116/79 128/81  Pulse: 72 (!) 59 64 81  Resp: (!) 22 16 18 16   Temp:  (!) 97.5 F (36.4 C) 97.8 F (36.6 C) 98.1 F (36.7 C)  TempSrc:  Oral Oral Oral  SpO2: 95% 93% 94% 96%   No data found.   Intake/Output Summary (Last 24 hours) at 07/28/2023 1201 Last data filed at 07/28/2023 1132 Gross per 24 hour  Intake 1777.67 ml  Output 700 ml  Net 1077.67 ml   There were no vitals filed for this visit.  Exam:  GEN: NAD SKIN: Warm  and dry EYES: EOMI ENT: MMM CV: RRR PULM: CTA B ABD: soft, ND, left sided tenderness, no rebound tenderness or guarding, +BS CNS: AAO x 3, non focal EXT: No edema or tenderness      Data Reviewed:   I have personally reviewed following labs and imaging studies:  Labs: Labs show the following:   Basic Metabolic Panel: Recent Labs  Lab 07/26/23 0714 07/27/23 1948 07/28/23 0526  NA 137 134* 136  K 4.0 3.5 4.0  CL 105 101 105  CO2 23 24 24   GLUCOSE 150* 97 115*  BUN 14 13 13   CREATININE 1.05 0.98 0.92  CALCIUM 9.2 9.0 8.4*   GFR Estimated Creatinine Clearance: 118.4 mL/min (by C-G formula based on SCr of 0.92 mg/dL). Liver Function Tests: Recent Labs  Lab 07/26/23 0714 07/27/23 1948  AST 37 31  ALT 37 35  ALKPHOS 47 44  BILITOT 1.1 0.9  PROT 7.5 7.0  ALBUMIN 4.1 3.8    Recent Labs  Lab 07/26/23 0714 07/27/23 1948  LIPASE 32 30   No results for input(s): "AMMONIA" in the last 168 hours. Coagulation profile No results for input(s): "INR", "PROTIME" in the last 168 hours.  CBC: Recent Labs  Lab 07/26/23 0714 07/27/23 1948 07/28/23 0526  WBC 8.2 10.8* 7.5  HGB 16.6 15.7 15.3  HCT 47.5 45.0 42.1  MCV 89.0 88.4 86.3  PLT 253 225 213   Cardiac Enzymes: No results for input(s): "CKTOTAL", "CKMB", "CKMBINDEX", "TROPONINI" in the last 168 hours. BNP (last 3 results) No results for input(s): "PROBNP" in the last 8760 hours. CBG: Recent Labs  Lab 07/28/23 0031 07/28/23 0533 07/28/23 1130  GLUCAP 106* 118* 103*   D-Dimer: No results for input(s): "DDIMER" in the last 72 hours. Hgb A1c: No results for input(s): "HGBA1C" in the last 72 hours. Lipid Profile: No results for input(s): "CHOL", "HDL", "LDLCALC", "TRIG", "CHOLHDL", "LDLDIRECT" in the last 72 hours. Thyroid function studies: No results for input(s): "TSH", "T4TOTAL", "T3FREE", "THYROIDAB" in the last 72 hours.  Invalid input(s): "FREET3" Anemia work up: No results for input(s): "VITAMINB12", "FOLATE", "FERRITIN", "TIBC", "IRON", "RETICCTPCT" in the last 72 hours. Sepsis Labs: Recent Labs  Lab 07/26/23 0714 07/27/23 1948 07/28/23 0526  WBC 8.2 10.8* 7.5    Microbiology No results found for this or any previous visit (from the past 240 hour(s)).  Procedures and diagnostic studies:  DG Abdomen 1 View  Result Date: 07/27/2023 CLINICAL DATA:  Left side abd pain EXAM: ABDOMEN - 1 VIEW COMPARISON:  CT abdomen pelvis 07/26/2023 FINDINGS: The bowel gas pattern is normal. Stool throughout ascending, transverse, rectosigmoid colon. Right upper quadrant surgical clips. No radio-opaque calculi or other significant radiographic abnormality are seen. IMPRESSION: Nonobstructive bowel gas pattern. Electronically Signed   By: Tish Frederickson M.D.   On: 07/27/2023 22:07                LOS: 1 day      [facility]   Pager on [email or web address]. If 7PM-7AM, please contact night-coverage at [email or web address]     07/28/2023, 12:01 PM

## 2023-07-29 ENCOUNTER — Ambulatory Visit: Payer: BLUE CROSS/BLUE SHIELD | Admitting: Surgery

## 2023-07-29 DIAGNOSIS — R109 Unspecified abdominal pain: Secondary | ICD-10-CM | POA: Diagnosis not present

## 2023-07-29 LAB — GLUCOSE, CAPILLARY
Glucose-Capillary: 102 mg/dL — ABNORMAL HIGH (ref 70–99)
Glucose-Capillary: 102 mg/dL — ABNORMAL HIGH (ref 70–99)
Glucose-Capillary: 134 mg/dL — ABNORMAL HIGH (ref 70–99)
Glucose-Capillary: 135 mg/dL — ABNORMAL HIGH (ref 70–99)

## 2023-07-29 MED ORDER — POLYETHYLENE GLYCOL 3350 17 G PO PACK
17.0000 g | PACK | Freq: Every day | ORAL | Status: DC
  Administered 2023-07-29: 17 g via ORAL
  Filled 2023-07-29: qty 1

## 2023-07-29 MED ORDER — ONDANSETRON HCL 4 MG PO TABS: 4.0000 mg | ORAL_TABLET | Freq: Four times a day (QID) | ORAL | 0 refills | Status: DC | PRN

## 2023-07-29 MED ORDER — DOCUSATE SODIUM 100 MG PO CAPS
100.0000 mg | ORAL_CAPSULE | Freq: Two times a day (BID) | ORAL | Status: DC
  Administered 2023-07-29: 100 mg via ORAL
  Filled 2023-07-29: qty 1

## 2023-07-29 NOTE — Progress Notes (Signed)
[facility] SURGICAL PROGRESS NOTE (cpt [phone])  Hospital Day(s): 2.   Interval History: Patient seen and examined, no acute events or new complaints overnight. Patient reports he is doing okay. Still with "sharp, poking" pain in left abdomen. No fever, chills, nausea, emesis. He feels constipated but does endorse gas. No new labs this morning. CT Enterography yesterday (08/14) was without acute intra-abdominal findings. He was started on CLD; nervous to eat.   Review of Systems:  Constitutional: denies fever, chills  HEENT: denies cough or congestion  Respiratory: denies any shortness of breath  Cardiovascular: denies chest pain or palpitations  Gastrointestinal: * abdominal pain, denied N/V Musculoskeletal: denies pain, decreased motor or sensation  Vital signs in last 24 hours: [min-max] current  Temp:  [98 F (36.7 C)-98.1 F (36.7 C)] 98.1 F (36.7 C) (08/15 0428) Pulse Rate:  [56-72] 56 (08/15 0433) Resp:  [16-18] 18 (08/15 0428) BP: (109-133)/(51-76) 109/51 (08/15 0433) SpO2:  [94 %-96 %] 94 % (08/15 0428)             Intake/Output last 2 shifts:  08/14 0701 - 08/15 0700 In: 630.8 [I.V.:630.8] Out: 3050 [Urine:3050]   Physical Exam:  Constitutional: alert, cooperative and no distress  HENT: normocephalic without obvious abnormality  Eyes: PERRL, EOM's grossly intact and symmetric  Respiratory: breathing non-labored at rest  Cardiovascular: regular rate and sinus rhythm  Gastrointestinal: soft, he continues to endorse left sided abdominal pain just above his hand assist site scar there, and non-distended. No rebound/guarding. No evidence of hernia Musculoskeletal: no edema or wounds, motor and sensation grossly intact, NT    Labs:     Latest Ref Rng & Units 07/28/2023    5:26 AM 07/27/2023    7:48 PM 07/26/2023    7:14 AM  CBC  WBC 4.0 - 10.5 K/uL 7.5  10.8  8.2   Hemoglobin 13.0 - 17.0 g/dL 60.4  54.0  98.1   Hematocrit 39.0 - 52.0 % 42.1  45.0   47.5   Platelets 150 - 400 K/uL 213  225  253       Latest Ref Rng & Units 07/28/2023    5:26 AM 07/27/2023    7:48 PM 07/26/2023    7:14 AM  CMP  Glucose 70 - 99 mg/dL 191  97  478   BUN 6 - 20 mg/dL 13  13  14    Creatinine 0.61 - 1.24 mg/dL 2.95  6.21  3.08   Sodium 135 - 145 mmol/L 136  134  137   Potassium 3.5 - 5.1 mmol/L 4.0  3.5  4.0   Chloride 98 - 111 mmol/L 105  101  105   CO2 22 - 32 mmol/L 24  24  23    Calcium 8.9 - 10.3 mg/dL 8.4  9.0  9.2   Total Protein 6.5 - 8.1 g/dL  7.0  7.5   Total Bilirubin 0.3 - 1.2 mg/dL  0.9  1.1   Alkaline Phos 38 - 126 U/L  44  47   AST 15 - 41 U/L  31  37   ALT 0 - 44 U/L  35  37      Imaging studies:   CT Enterography (07/28/2023) personally reviewed without acute intra-abdominal findings, no free air, no abscess, and radiologist report reviewed below:  IMPRESSION: 1. No evidence of inflammatory bowel disease. 2. No acute CT findings of the abdomen or pelvis to explain abdominal pain. 3. Status post left nephrectomy. 4. Hepatic steatosis. 5.  Status post cholecystectomy. 6. Descending and sigmoid diverticulosis without evidence of acute diverticulitis.     Assessment/Plan: (ICD-10's: R10.9) 57 y.o. male with intractable left sided abdominal pain   - Work up to this point is without any evidence of surgical findings (ie: perforation, abscess, obstruction, hernia, mass). At this point, I do no think there is any indication for diagnostic laparoscopy unless all other options are completely exhausted. Again, he does have what appear to be thickening of peritoneum in the left abdomen but suspect this may be sequela of previous hernia repair at that site. No evidence mesh infection. May consider GI consultation; he did have colonoscopy in 2023 was reassuring (two polyps and sigmoid diverticulosis). We will remain available   - Okay for diet from our perspective  - Monitor abdominal examination; on-going bowel function - Pain control  prn; antiemetics prn              - Mobilize as tolerated              - Further management per primary service   All of the above findings and recommendations were discussed with the patient, and the medical team, and all of patient's questions were answered to his expressed satisfaction.  -- Lynden Oxford, PA-C [facility] 07/29/2023, 7:37 AM M-F: 7am - 4pm

## 2023-07-29 NOTE — Plan of Care (Signed)
  Problem: Education: Goal: Ability to describe self-care measures that may prevent or decrease complications (Diabetes Survival Skills Education) will improve Outcome: Progressing Goal: Individualized Educational Video(s) Outcome: Progressing   Problem: Coping: Goal: Ability to adjust to condition or change in health will improve Outcome: Progressing   Problem: Nutritional: Goal: Maintenance of adequate nutrition will improve Outcome: Progressing Goal: Progress toward achieving an optimal weight will improve Outcome: Progressing   Problem: Metabolic: Goal: Ability to maintain appropriate glucose levels will improve Outcome: Progressing

## 2023-07-29 NOTE — Discharge Summary (Signed)
Physician Discharge Summary   Patient: Evan [name] MRN: [id number] DOB: 1966/06/26  Admit date:     07/27/2023  Discharge date: 07/29/23  Discharge Physician: Loyce Dys   PCP: Doreene Nest, NP   Recommendations at discharge:  Follow-up with surgery  Discharge Diagnoses:  Hospital Course:  Evan [name] is a 57 y.o. Caucasian male with medical history significant for hypertension, dyslipidemia and seizure disorder, who presented to the emergency room with acute onset of intractable abdominal pain mainly in the left upper quadrant and left mid abdomen with radiation to his back since Friday.  He has been having intermittent pain for the last year.  He admitted to associated nausea without vomiting.  He had diarrhea Saturday and his last bowel movement was then.  No fever or chills.  No dysuria, oliguria, urinary frequency or urgency or flank pain.  No chest pain or palpitations.  No cough or wheezing or dyspnea.  The patient was seen in the ER yesterday.  His labs then revealed negative CMP and CBC.  Abdominal and pelvic CT scan showed no acute abnormalities.   The patient stated that he had similar symptoms years ago when his workup was negative and he underwent diagnostic laparoscopy that revealed adhesions when he underwent lysis of adhesions.   ED Course: When the patient came to the ER, BP was 148/103 with otherwise normal vital signs.  Later on respiratory rate was 23.  Labs revealed mild hyponatremia 134 borderline potassium of 3.5 compared to 40 yesterday with otherwise unremarkable CMP.  CBC showed WBC of 10.8 compared to 8.2 yesterday. EKG as reviewed by me : None. Imaging: 1 view abdomen x-ray today showed nonobstructive bowel gas pattern. Abdominal pelvic CT scan yesterday showed the following: .1. No acute findings within the abdomen or pelvis. 2. Unchanged, chronic, mild fat stranding surrounding the distal jejunal mesenteric vessels. This is similar in appearance  when compared with 08/09/2022. This is nonspecific but can be seen in the setting of prior mesenteric panniculitis. 3. Distal colonic diverticulosis without signs of acute diverticulitis. 4. Status post left nephrectomy. 5.  Aortic Atherosclerosis.   Patient was subsequently seen by surgery in consultation.  At this point abdominal pain is better and given no surgical indications patient has agreed for discharge today with follow-up with his surgeons.  Also surgeon recommended patient follows up with hernia surgeon at [facility] who deals with hernia cases just in case his pain is related to mesh complication.  Consultants: Surgery Procedures performed: None Disposition: Home Diet recommendation:  Discharge Diet Orders (From admission, onward)     Start     Ordered   07/29/23 0000  Diet - low sodium heart healthy        07/29/23 1146           Full liquid diet DISCHARGE MEDICATION: Allergies as of 07/29/2023       Reactions   Nuvigil [armodafinil] Other (See Comments)   Seizures   Seroquel [quetiapine] Other (See Comments)   Seizures.   Simvastatin Hives        Medication List     STOP taking these medications    potassium chloride 10 MEQ tablet Commonly known as: KLOR-CON       TAKE these medications    bisacodyl 10 MG suppository Commonly known as: Dulcolax Place 1 suppository (10 mg total) rectally as needed for moderate constipation.   docusate sodium 100 MG capsule Commonly known as: Colace Take 1 capsule (100 mg total) by  mouth 2 (two) times daily.   furosemide 20 MG tablet Commonly known as: LASIX Take 1 tablet (20mg ) AS NEEDED for wt gain of 3lbs overnight or 5 lbs in one week.   olmesartan 20 MG tablet Commonly known as: BENICAR TAKE 1 TABLET (20 MG TOTAL) BY MOUTH DAILY. FOR BLOOD PRESSURE.   omeprazole 20 MG capsule Commonly known as: PRILOSEC Take 1 capsule (20 mg total) by mouth daily.   ondansetron 4 MG tablet Commonly known as:  ZOFRAN Take 1 tablet (4 mg total) by mouth every 6 (six) hours as needed for nausea.   oxyCODONE-acetaminophen 5-325 MG tablet Commonly known as: Percocet Take 1 tablet by mouth every 6 (six) hours as needed for severe pain.   polyethylene glycol 17 g packet Commonly known as: MiraLax Take 17 g by mouth daily.   spironolactone 25 MG tablet Commonly known as: ALDACTONE Take 1 tablet (25 mg total) by mouth daily. for blood pressure.        Follow-up Information     Paulla Fore, MD Follow up in 3 week(s).   Specialty: General Surgery Contact information: [address] [place] [address] [phone]                Discharge Exam:  GENERAL: Middle-age male in no acute distress HEENT: Normocephalic atraumatic LUNGS: Normal breath sounds bilaterally, no wheezing, rales,rhonchi or crepitation. No use of accessory muscles of respiration.  CARDIOVASCULAR: Regular rate and rhythm, S1, S2 normal. No murmurs, rubs, or gallops.  ABDOMEN: Abdomen soft obese minimally tender EXTREMITIES: No pedal edema, cyanosis, or clubbing.  NEUROLOGIC: Cranial nerves II through XII are intact. Muscle strength 5/5 in all extremities. Sensation intact. Gait not checked.  PSYCHIATRIC: The patient is alert and oriented x 3.  Normal affect and good eye contact.  Condition at discharge: good   Discharge time spent: \ 35 minutes.  Signed: Loyce Dys, MD [facility] 07/29/2023

## 2023-08-02 ENCOUNTER — Telehealth: Payer: Self-pay

## 2023-08-02 NOTE — Transitions of Care (Post Inpatient/ED Visit) (Unsigned)
   08/02/2023  Name: Evan [name] MRN: [id number] DOB: June 26, 1966  Today's TOC FU Call Status: Today's TOC FU Call Status:: Unsuccessful Call (1st Attempt) Unsuccessful Call (1st Attempt) Date: 08/02/23  Attempted to reach the patient regarding the most recent Inpatient/ED visit.  Follow Up Plan: Additional outreach attempts will be made to reach the patient to complete the Transitions of Care (Post Inpatient/ED visit) call.   Signature   Woodfin Ganja LPN [facility] Nurse Health Advisor Direct Dial [phone]

## 2023-08-03 ENCOUNTER — Telehealth: Payer: Self-pay

## 2023-08-03 NOTE — Transitions of Care (Post Inpatient/ED Visit) (Signed)
   08/03/2023  Name: Evan [name] MRN: [id number] DOB: [birth year]  Today's TOC FU Call Status: Today's TOC FU Call Status:: Unsuccessful Call (2nd Attempt) Unsuccessful Call (1st Attempt) Date: 08/02/23 Unsuccessful Call (2nd Attempt) Date: 08/03/23 [facility] FU Call Complete Date: 08/03/23  Attempted to reach the patient regarding the most recent Inpatient/ED visit.  Follow Up Plan: No further outreach attempts will be made at this time. We have been unable to contact the patient.  Signature   Woodfin Ganja LPN [facility] Nurse Health Advisor Direct Dial [phone]

## 2023-08-03 NOTE — Transitions of Care (Post Inpatient/ED Visit) (Signed)
   08/03/2023  Name: Evan [name] MRN: [id number] DOB: November 07, 1966  Today's TOC FU Call Status:    Attempted to reach the patient regarding the most recent Inpatient/ED visit.  Follow Up Plan: Additional outreach attempts will be made to reach the patient to complete the Transitions of Care (Post Inpatient/ED visit) call. Unsucceful attempt #1  Edwyn Inclan J. Cristela Felt, RN, BSN, MSN Care Management Coordinator/[place] Phone Number:  [phone]

## 2023-08-04 ENCOUNTER — Telehealth: Payer: Self-pay

## 2023-08-04 NOTE — Transitions of Care (Post Inpatient/ED Visit) (Signed)
   08/04/2023  Name: Evan [name] MRN: [id number] DOB: [birth year]  Today's TOC FU Call Status: Today's TOC FU Call Status:: Unsuccessful Call (2nd Attempt) Unsuccessful Call (1st Attempt) Date: 08/03/23 Unsuccessful Call (2nd Attempt) Date: 08/04/23  Attempted to reach the patient regarding the most recent Inpatient/ED visit.  Follow Up Plan: Additional outreach attempts will be made to reach the patient to complete the Transitions of Care (Post Inpatient/ED visit) call.   Ardelia Wrede J. Cristela Felt, RN, BSN, MSN Care Management Coordinator/[place] Phone Number:  [phone]

## 2023-08-04 NOTE — Transitions of Care (Post Inpatient/ED Visit) (Signed)
   08/04/2023  Name: Evan [name] MRN: [id number] DOB: January 15, 1966  Today's TOC FU Call Status: Today's TOC FU Call Status:: Unsuccessful Call (3rd Attempt) Unsuccessful Call (1st Attempt) Date: 08/03/23 Unsuccessful Call (2nd Attempt) Date: 08/04/23 Unsuccessful Call (3rd Attempt) Date: 08/04/23  Attempted to reach the patient regarding the most recent Inpatient/ED visit.  Follow Up Plan: No further outreach attempts will be made at this time. We have been unable to contact the patient.  Sebastain Fishbaugh J. Cristela Felt, RN, BSN, MSN Care Management Coordinator/[place] Phone Number:  [phone]

## 2023-09-17 ENCOUNTER — Ambulatory Visit: Payer: BLUE CROSS/BLUE SHIELD | Admitting: Cardiology

## 2023-10-04 DIAGNOSIS — Z6841 Body Mass Index (BMI) 40.0 and over, adult: Secondary | ICD-10-CM | POA: Diagnosis not present

## 2023-10-04 DIAGNOSIS — M542 Cervicalgia: Secondary | ICD-10-CM | POA: Diagnosis not present

## 2023-10-07 ENCOUNTER — Other Ambulatory Visit: Payer: Self-pay | Admitting: Surgery

## 2023-10-07 DIAGNOSIS — M4722 Other spondylosis with radiculopathy, cervical region: Secondary | ICD-10-CM

## 2023-10-08 ENCOUNTER — Other Ambulatory Visit: Payer: Self-pay | Admitting: Neurological Surgery

## 2023-10-25 ENCOUNTER — Encounter: Payer: Self-pay | Admitting: Surgery

## 2023-10-29 ENCOUNTER — Ambulatory Visit
Admission: RE | Admit: 2023-10-29 | Discharge: 2023-10-29 | Disposition: A | Discharge status: Home / Self Care | Payer: BLUE CROSS/BLUE SHIELD | Source: Ambulatory Visit | Attending: Surgery | Admitting: Surgery

## 2023-10-29 DIAGNOSIS — M5412 Radiculopathy, cervical region: Secondary | ICD-10-CM | POA: Diagnosis not present

## 2023-10-29 DIAGNOSIS — Z981 Arthrodesis status: Secondary | ICD-10-CM | POA: Diagnosis not present

## 2023-10-29 DIAGNOSIS — M4722 Other spondylosis with radiculopathy, cervical region: Secondary | ICD-10-CM

## 2023-11-02 ENCOUNTER — Other Ambulatory Visit: Payer: Self-pay | Admitting: Neurological Surgery

## 2023-11-09 NOTE — Pre-Procedure Instructions (Signed)
Surgical Instructions   Your procedure is scheduled on November 18, 2023. Report to [facility] Main Entrance "A" at 5:30 A.M., then check in with the Admitting office. Any questions or running late day of surgery: call [phone]  Questions prior to your surgery date: call [phone], Monday-Friday, 8am-4pm. If you experience any cold or flu symptoms such as cough, fever, chills, shortness of breath, etc. between now and your scheduled surgery, please notify us at the above number.     Remember:  Do not eat or drink after midnight the night before your surgery    Take these medicines the morning of surgery with A SIP OF WATER: omeprazole (PRILOSEC)    May take these medicines IF NEEDED: docusate sodium (COLACE)    One week prior to surgery, STOP taking any Aspirin (unless otherwise instructed by your surgeon) Aleve, Naproxen, Ibuprofen, Motrin, Advil, Goody's, BC's, all herbal medications, fish oil, and non-prescription vitamins.                     Do NOT Smoke (Tobacco/Vaping) for 24 hours prior to your procedure.  If you use a CPAP at night, you may bring your mask/headgear for your overnight stay.   You will be asked to remove any contacts, glasses, piercing's, hearing aid's, dentures/partials prior to surgery. Please bring cases for these items if needed.    Patients discharged the day of surgery will not be allowed to drive home, and someone needs to stay with them for 24 hours.  SURGICAL WAITING ROOM VISITATION Patients may have no more than 2 support people in the waiting area - these visitors may rotate.   Pre-op nurse will coordinate an appropriate time for 1 ADULT support person, who may not rotate, to accompany patient in pre-op.  Children under the age of 81 must have an adult with them who is not the patient and must remain in the main waiting area with an adult.  If the patient needs to stay at the hospital during part of their recovery, the visitor guidelines  for inpatient rooms apply.  Please refer to the [facility] website for the visitor guidelines for any additional information.   If you received a COVID test during your pre-op visit  it is requested that you wear a mask when out in public, stay away from anyone that may not be feeling well and notify your surgeon if you develop symptoms. If you have been in contact with anyone that has tested positive in the last 10 days please notify you surgeon.      Pre-operative 5 CHG Bathing Instructions   You can play a key role in reducing the risk of infection after surgery. Your skin needs to be as free of germs as possible. You can reduce the number of germs on your skin by washing with CHG (chlorhexidine gluconate) soap before surgery. CHG is an antiseptic soap that kills germs and continues to kill germs even after washing.   DO NOT use if you have an allergy to chlorhexidine/CHG or antibacterial soaps. If your skin becomes reddened or irritated, stop using the CHG and notify one of our RNs at [phone].   Please shower with the CHG soap starting 4 days before surgery using the following schedule:     Please keep in mind the following:  DO NOT shave, including legs and underarms, starting the day of your first shower.   You may shave your face at any point before/day of surgery.  Place clean sheets on your bed the day you start using CHG soap. Use a clean washcloth (not used since being washed) for each shower. DO NOT sleep with pets once you start using the CHG.   CHG Shower Instructions:  Wash your face and private area with normal soap. If you choose to wash your hair, wash first with your normal shampoo.  After you use shampoo/soap, rinse your hair and body thoroughly to remove shampoo/soap residue.  Turn the water OFF and apply about 3 tablespoons (45 ml) of CHG soap to a CLEAN washcloth.  Apply CHG soap ONLY FROM YOUR NECK DOWN TO YOUR TOES (washing for 3-5 minutes)  DO NOT use  CHG soap on face, private areas, open wounds, or sores.  Pay special attention to the area where your surgery is being performed.  If you are having back surgery, having someone wash your back for you may be helpful. Wait 2 minutes after CHG soap is applied, then you may rinse off the CHG soap.  Pat dry with a clean towel  Put on clean clothes/pajamas   If you choose to wear lotion, please use ONLY the CHG-compatible lotions on the back of this paper.   Additional instructions for the day of surgery: DO NOT APPLY any lotions, deodorants, cologne, or perfumes.   Do not bring valuables to the hospital. [facility] is not responsible for any belongings/valuables. Do not wear nail polish, gel polish, artificial nails, or any other type of covering on natural nails (fingers and toes) Do not wear jewelry or makeup Put on clean/comfortable clothes.  Please brush your teeth.  Ask your nurse before applying any prescription medications to the skin.     CHG Compatible Lotions   Aveeno Moisturizing lotion  Cetaphil Moisturizing Cream  Cetaphil Moisturizing Lotion  Clairol Herbal Essence Moisturizing Lotion, Dry Skin  Clairol Herbal Essence Moisturizing Lotion, Extra Dry Skin  Clairol Herbal Essence Moisturizing Lotion, Normal Skin  Curel Age Defying Therapeutic Moisturizing Lotion with Alpha Hydroxy  Curel Extreme Care Body Lotion  Curel Soothing Hands Moisturizing Hand Lotion  Curel Therapeutic Moisturizing Cream, Fragrance-Free  Curel Therapeutic Moisturizing Lotion, Fragrance-Free  Curel Therapeutic Moisturizing Lotion, Original Formula  Eucerin Daily Replenishing Lotion  Eucerin Dry Skin Therapy Plus Alpha Hydroxy Crme  Eucerin Dry Skin Therapy Plus Alpha Hydroxy Lotion  Eucerin Original Crme  Eucerin Original Lotion  Eucerin Plus Crme Eucerin Plus Lotion  Eucerin TriLipid Replenishing Lotion  Keri Anti-Bacterial Hand Lotion  Keri Deep Conditioning Original Lotion Dry Skin  Formula Softly Scented  Keri Deep Conditioning Original Lotion, Fragrance Free Sensitive Skin Formula  Keri Lotion Fast Absorbing Fragrance Free Sensitive Skin Formula  Keri Lotion Fast Absorbing Softly Scented Dry Skin Formula  Keri Original Lotion  Keri Skin Renewal Lotion Keri Silky Smooth Lotion  Keri Silky Smooth Sensitive Skin Lotion  Nivea Body Creamy Conditioning Oil  Nivea Body Extra Enriched Lotion  Nivea Body Original Lotion  Nivea Body Sheer Moisturizing Lotion Nivea Crme  Nivea Skin Firming Lotion  NutraDerm 30 Skin Lotion  NutraDerm Skin Lotion  NutraDerm Therapeutic Skin Cream  NutraDerm Therapeutic Skin Lotion  ProShield Protective Hand Cream  Provon moisturizing lotion  Please read over the following fact sheets that you were given.

## 2023-11-10 ENCOUNTER — Encounter (HOSPITAL_COMMUNITY): Payer: Self-pay

## 2023-11-10 ENCOUNTER — Encounter (HOSPITAL_COMMUNITY)
Admission: RE | Admit: 2023-11-10 | Discharge: 2023-11-10 | Disposition: A | Discharge status: Home / Self Care | Payer: BLUE CROSS/BLUE SHIELD | Source: Ambulatory Visit | Attending: Neurological Surgery | Admitting: Neurological Surgery

## 2023-11-10 ENCOUNTER — Other Ambulatory Visit: Payer: Self-pay

## 2023-11-10 VITALS — BP 126/81 | HR 95 | Temp 97.9°F | Resp 18 | Ht 70.0 in | Wt 249.9 lb

## 2023-11-10 DIAGNOSIS — Z01818 Encounter for other preprocedural examination: Secondary | ICD-10-CM

## 2023-11-10 DIAGNOSIS — Z01812 Encounter for preprocedural laboratory examination: Secondary | ICD-10-CM | POA: Diagnosis not present

## 2023-11-10 HISTORY — DX: Gastro-esophageal reflux disease without esophagitis: K21.9

## 2023-11-10 HISTORY — DX: Sleep apnea, unspecified: G47.30

## 2023-11-10 HISTORY — DX: Headache, unspecified: R51.9

## 2023-11-10 LAB — CBC
HCT: 46.5 % (ref 39.0–52.0)
Hemoglobin: 16.5 g/dL (ref 13.0–17.0)
MCH: 31.3 pg (ref 26.0–34.0)
MCHC: 35.5 g/dL (ref 30.0–36.0)
MCV: 88.1 fL (ref 80.0–100.0)
Platelets: 254 10*3/uL (ref 150–400)
RBC: 5.28 MIL/uL (ref 4.22–5.81)
RDW: 12.8 % (ref 11.5–15.5)
WBC: 9.6 10*3/uL (ref 4.0–10.5)
nRBC: 0 % (ref 0.0–0.2)

## 2023-11-10 LAB — BASIC METABOLIC PANEL
Anion gap: 10 (ref 5–15)
BUN: 13 mg/dL (ref 6–20)
CO2: 23 mmol/L (ref 22–32)
Calcium: 9.4 mg/dL (ref 8.9–10.3)
Chloride: 102 mmol/L (ref 98–111)
Creatinine, Ser: 1.23 mg/dL (ref 0.61–1.24)
GFR, Estimated: >60 mL/min (ref >60)
Glucose, Bld: 153 mg/dL — ABNORMAL HIGH (ref 70–99)
Potassium: 4.3 mmol/L (ref 3.5–5.1)
Sodium: 135 mmol/L (ref 135–145)

## 2023-11-10 LAB — TYPE AND SCREEN
ABO/RH(D): A NEG
Antibody Screen: NEGATIVE

## 2023-11-10 LAB — SURGICAL PCR SCREEN
MRSA, PCR: NEGATIVE
Staphylococcus aureus: NEGATIVE

## 2023-11-10 NOTE — Progress Notes (Signed)
PCP - Vernona Rieger, FNP Cardiologist - Dr. Debbe Odea; per patient, Cardiologist told him he did not need to f/u after Echo if everything was ok.   PPM/ICD - denies Device Orders - na Rep Notified - na  Chest x-ray - na EKG - 06/10/2023 Stress Test - 08/28/2021 ECHO - 06/24/2023 Cardiac Cath - 05/04/2018  Sleep Study - OSA CPAP - wears nightly  Non-diabetic  Blood Thinner Instructions: denies Aspirin Instructions:denies  ERAS Protcol -NPO  COVID TEST- na  Anesthesia review: Yes. HTN, CKD, high cholesterol, seizures  Patient denies shortness of breath, fever, cough and chest pain at PAT appointment   All instructions explained to the patient, with a verbal understanding of the material. Patient agrees to go over the instructions while at home for a better understanding. Patient also instructed to self quarantine after being tested for COVID-19. The opportunity to ask questions was provided.

## 2023-11-18 ENCOUNTER — Ambulatory Visit (HOSPITAL_COMMUNITY)
Admission: RE | Disposition: A | Discharge status: Home / Self Care | Payer: Self-pay | Source: Home / Self Care | Attending: Neurological Surgery

## 2023-11-18 ENCOUNTER — Other Ambulatory Visit: Payer: Self-pay

## 2023-11-18 ENCOUNTER — Ambulatory Visit (HOSPITAL_COMMUNITY): Payer: BLUE CROSS/BLUE SHIELD | Admitting: Anesthesiology

## 2023-11-18 ENCOUNTER — Encounter (HOSPITAL_COMMUNITY): Payer: Self-pay | Admitting: Neurological Surgery

## 2023-11-18 ENCOUNTER — Ambulatory Visit (HOSPITAL_COMMUNITY): Payer: BLUE CROSS/BLUE SHIELD

## 2023-11-18 ENCOUNTER — Ambulatory Visit (HOSPITAL_COMMUNITY): Payer: Self-pay | Admitting: Physician Assistant

## 2023-11-18 ENCOUNTER — Observation Stay (HOSPITAL_COMMUNITY)
Admission: RE | Admit: 2023-11-18 | Discharge: 2023-11-19 | Disposition: A | Discharge status: Home / Self Care | Payer: BLUE CROSS/BLUE SHIELD | Attending: Neurological Surgery | Admitting: Neurological Surgery

## 2023-11-18 DIAGNOSIS — M4802 Spinal stenosis, cervical region: Secondary | ICD-10-CM | POA: Insufficient documentation

## 2023-11-18 DIAGNOSIS — M5412 Radiculopathy, cervical region: Principal | ICD-10-CM | POA: Diagnosis present

## 2023-11-18 DIAGNOSIS — Z79899 Other long term (current) drug therapy: Secondary | ICD-10-CM | POA: Insufficient documentation

## 2023-11-18 DIAGNOSIS — M4302 Spondylolysis, cervical region: Secondary | ICD-10-CM | POA: Diagnosis not present

## 2023-11-18 DIAGNOSIS — N189 Chronic kidney disease, unspecified: Secondary | ICD-10-CM | POA: Insufficient documentation

## 2023-11-18 DIAGNOSIS — E1122 Type 2 diabetes mellitus with diabetic chronic kidney disease: Secondary | ICD-10-CM | POA: Diagnosis not present

## 2023-11-18 DIAGNOSIS — M4322 Fusion of spine, cervical region: Secondary | ICD-10-CM | POA: Diagnosis not present

## 2023-11-18 DIAGNOSIS — Z981 Arthrodesis status: Secondary | ICD-10-CM | POA: Diagnosis not present

## 2023-11-18 DIAGNOSIS — I129 Hypertensive chronic kidney disease with stage 1 through stage 4 chronic kidney disease, or unspecified chronic kidney disease: Secondary | ICD-10-CM | POA: Insufficient documentation

## 2023-11-18 DIAGNOSIS — M4722 Other spondylosis with radiculopathy, cervical region: Secondary | ICD-10-CM | POA: Diagnosis not present

## 2023-11-18 HISTORY — PX: ANTERIOR CERVICAL DECOMP/DISCECTOMY FUSION: SHX1161

## 2023-11-18 LAB — GLUCOSE, CAPILLARY: Glucose-Capillary: 157 mg/dL — ABNORMAL HIGH (ref 70–99)

## 2023-11-18 LAB — ABO/RH: ABO/RH(D): A NEG

## 2023-11-18 SURGERY — ANTERIOR CERVICAL DECOMPRESSION/DISCECTOMY FUSION 2 LEVEL/HARDWARE REMOVAL
Anesthesia: General

## 2023-11-18 MED ORDER — CEFAZOLIN SODIUM-DEXTROSE 2-4 GM/100ML-% IV SOLN
2.0000 g | Freq: Four times a day (QID) | INTRAVENOUS | Status: AC
  Administered 2023-11-18 (×2): 2 g via INTRAVENOUS
  Filled 2023-11-18 (×2): qty 100

## 2023-11-18 MED ORDER — LIDOCAINE 2% (20 MG/ML) 5 ML SYRINGE
INTRAMUSCULAR | Status: DC | PRN
  Administered 2023-11-18: 100 mg via INTRAVENOUS

## 2023-11-18 MED ORDER — CHLORHEXIDINE GLUCONATE 0.12 % MT SOLN
15.0000 mL | Freq: Once | OROMUCOSAL | Status: AC
  Administered 2023-11-18: 15 mL via OROMUCOSAL
  Filled 2023-11-18: qty 15

## 2023-11-18 MED ORDER — FENTANYL CITRATE (PF) 250 MCG/5ML IJ SOLN
INTRAMUSCULAR | Status: DC | PRN
  Administered 2023-11-18: 50 ug via INTRAVENOUS
  Administered 2023-11-18: 150 ug via INTRAVENOUS
  Administered 2023-11-18: 50 ug via INTRAVENOUS

## 2023-11-18 MED ORDER — THROMBIN 5000 UNITS EX SOLR
CUTANEOUS | Status: AC
  Filled 2023-11-18: qty 15000

## 2023-11-18 MED ORDER — EPHEDRINE SULFATE-NACL 50-0.9 MG/10ML-% IV SOSY
PREFILLED_SYRINGE | INTRAVENOUS | Status: DC | PRN
  Administered 2023-11-18 (×2): 10 mg via INTRAVENOUS

## 2023-11-18 MED ORDER — FENTANYL CITRATE (PF) 250 MCG/5ML IJ SOLN
INTRAMUSCULAR | Status: AC
  Filled 2023-11-18: qty 5

## 2023-11-18 MED ORDER — ROCURONIUM BROMIDE 10 MG/ML (PF) SYRINGE
PREFILLED_SYRINGE | INTRAVENOUS | Status: AC
  Filled 2023-11-18: qty 10

## 2023-11-18 MED ORDER — ORAL CARE MOUTH RINSE: 15.0000 mL | Freq: Once | OROMUCOSAL | Status: AC

## 2023-11-18 MED ORDER — MIDAZOLAM HCL 2 MG/2ML IJ SOLN
INTRAMUSCULAR | Status: DC | PRN
  Administered 2023-11-18: 2 mg via INTRAVENOUS

## 2023-11-18 MED ORDER — SODIUM CHLORIDE 0.9% FLUSH
3.0000 mL | Freq: Two times a day (BID) | INTRAVENOUS | Status: DC
  Administered 2023-11-18: 3 mL via INTRAVENOUS

## 2023-11-18 MED ORDER — ROCURONIUM BROMIDE 10 MG/ML (PF) SYRINGE
PREFILLED_SYRINGE | INTRAVENOUS | Status: DC | PRN
  Administered 2023-11-18: 70 mg via INTRAVENOUS
  Administered 2023-11-18: 40 mg via INTRAVENOUS
  Administered 2023-11-18: 30 mg via INTRAVENOUS

## 2023-11-18 MED ORDER — HYDROCODONE-ACETAMINOPHEN 5-325 MG PO TABS
1.0000 | ORAL_TABLET | ORAL | Status: DC | PRN
  Administered 2023-11-18 – 2023-11-19 (×3): 1 via ORAL
  Filled 2023-11-18 (×3): qty 1

## 2023-11-18 MED ORDER — ONDANSETRON HCL 4 MG/2ML IJ SOLN
INTRAMUSCULAR | Status: AC
  Filled 2023-11-18: qty 2

## 2023-11-18 MED ORDER — DEXAMETHASONE SODIUM PHOSPHATE 10 MG/ML IJ SOLN
INTRAMUSCULAR | Status: DC | PRN
  Administered 2023-11-18: 5 mg via INTRAVENOUS

## 2023-11-18 MED ORDER — METHOCARBAMOL 1000 MG/10ML IJ SOLN: 500.0000 mg | Freq: Four times a day (QID) | INTRAMUSCULAR | Status: DC | PRN

## 2023-11-18 MED ORDER — MENTHOL 3 MG MT LOZG
1.0000 | LOZENGE | OROMUCOSAL | Status: DC | PRN
  Administered 2023-11-19: 3 mg via ORAL
  Filled 2023-11-18: qty 9

## 2023-11-18 MED ORDER — THROMBIN 5000 UNITS EX SOLR
OROMUCOSAL | Status: DC | PRN
  Administered 2023-11-18: 5 mL via TOPICAL

## 2023-11-18 MED ORDER — KETOROLAC TROMETHAMINE 15 MG/ML IJ SOLN
15.0000 mg | Freq: Four times a day (QID) | INTRAMUSCULAR | Status: AC
  Administered 2023-11-18 – 2023-11-19 (×4): 15 mg via INTRAVENOUS
  Filled 2023-11-18 (×4): qty 1

## 2023-11-18 MED ORDER — ONDANSETRON HCL 4 MG/2ML IJ SOLN
INTRAMUSCULAR | Status: DC | PRN
  Administered 2023-11-18: 4 mg via INTRAVENOUS

## 2023-11-18 MED ORDER — PROPOFOL 10 MG/ML IV BOLUS
INTRAVENOUS | Status: DC | PRN
  Administered 2023-11-18: 150 mg via INTRAVENOUS

## 2023-11-18 MED ORDER — SUGAMMADEX SODIUM 200 MG/2ML IV SOLN
INTRAVENOUS | Status: DC | PRN
  Administered 2023-11-18: 400 mg via INTRAVENOUS

## 2023-11-18 MED ORDER — INSULIN ASPART 100 UNIT/ML IJ SOLN: 0.0000 [IU] | INTRAMUSCULAR | Status: DC | PRN

## 2023-11-18 MED ORDER — POLYETHYLENE GLYCOL 3350 17 G PO PACK: 17.0000 g | PACK | Freq: Every day | ORAL | Status: DC | PRN

## 2023-11-18 MED ORDER — ACETAMINOPHEN 650 MG RE SUPP: 650.0000 mg | RECTAL | Status: DC | PRN

## 2023-11-18 MED ORDER — HYDROMORPHONE HCL 1 MG/ML IJ SOLN: 0.5000 mg | INTRAMUSCULAR | Status: DC | PRN

## 2023-11-18 MED ORDER — CHLORHEXIDINE GLUCONATE CLOTH 2 % EX PADS: 6.0000 | MEDICATED_PAD | Freq: Once | CUTANEOUS | Status: DC

## 2023-11-18 MED ORDER — FLEET ENEMA RE ENEM: 1.0000 | ENEMA | Freq: Once | RECTAL | Status: DC | PRN

## 2023-11-18 MED ORDER — LIDOCAINE 2% (20 MG/ML) 5 ML SYRINGE
INTRAMUSCULAR | Status: AC
Start: 2023-11-18 — End: ?
  Filled 2023-11-18: qty 5

## 2023-11-18 MED ORDER — IRBESARTAN 150 MG PO TABS
150.0000 mg | ORAL_TABLET | Freq: Every day | ORAL | Status: DC
  Administered 2023-11-18: 150 mg via ORAL
  Filled 2023-11-18: qty 1

## 2023-11-18 MED ORDER — DOCUSATE SODIUM 100 MG PO CAPS
100.0000 mg | ORAL_CAPSULE | Freq: Two times a day (BID) | ORAL | Status: DC
  Administered 2023-11-18 – 2023-11-19 (×2): 100 mg via ORAL
  Filled 2023-11-18 (×2): qty 1

## 2023-11-18 MED ORDER — PHENYLEPHRINE HCL-NACL 20-0.9 MG/250ML-% IV SOLN
INTRAVENOUS | Status: DC | PRN
  Administered 2023-11-18: 30 ug/min via INTRAVENOUS

## 2023-11-18 MED ORDER — MIDAZOLAM HCL 2 MG/2ML IJ SOLN
INTRAMUSCULAR | Status: AC
Start: 2023-11-18 — End: ?
  Filled 2023-11-18: qty 2

## 2023-11-18 MED ORDER — ONDANSETRON HCL 4 MG/2ML IJ SOLN: 4.0000 mg | Freq: Four times a day (QID) | INTRAMUSCULAR | Status: DC | PRN

## 2023-11-18 MED ORDER — PHENOL 1.4 % MT LIQD: 1.0000 | OROMUCOSAL | Status: DC | PRN

## 2023-11-18 MED ORDER — ACETAMINOPHEN 325 MG PO TABS: 650.0000 mg | ORAL_TABLET | ORAL | Status: DC | PRN

## 2023-11-18 MED ORDER — PANTOPRAZOLE SODIUM 40 MG PO TBEC
40.0000 mg | DELAYED_RELEASE_TABLET | Freq: Every day | ORAL | Status: DC
  Administered 2023-11-19: 40 mg via ORAL
  Filled 2023-11-18: qty 1

## 2023-11-18 MED ORDER — ONDANSETRON HCL 4 MG PO TABS: 4.0000 mg | ORAL_TABLET | Freq: Four times a day (QID) | ORAL | Status: DC | PRN

## 2023-11-18 MED ORDER — FUROSEMIDE 20 MG PO TABS: 20.0000 mg | ORAL_TABLET | Freq: Every day | ORAL | Status: DC | PRN

## 2023-11-18 MED ORDER — THROMBIN (RECOMBINANT) 5000 UNITS EX SOLR
CUTANEOUS | Status: DC | PRN
  Administered 2023-11-18: 10 mL via TOPICAL

## 2023-11-18 MED ORDER — OXYCODONE HCL 5 MG/5ML PO SOLN: 5.0000 mg | Freq: Once | ORAL | Status: DC | PRN

## 2023-11-18 MED ORDER — SODIUM CHLORIDE 0.9% FLUSH: 3.0000 mL | INTRAVENOUS | Status: DC | PRN

## 2023-11-18 MED ORDER — DEXAMETHASONE SODIUM PHOSPHATE 10 MG/ML IJ SOLN
INTRAMUSCULAR | Status: AC
  Filled 2023-11-18: qty 1

## 2023-11-18 MED ORDER — HYDROCODONE-ACETAMINOPHEN 5-325 MG PO TABS: 2.0000 | ORAL_TABLET | ORAL | Status: DC | PRN

## 2023-11-18 MED ORDER — INSULIN ASPART 100 UNIT/ML IJ SOLN
INTRAMUSCULAR | Status: AC
  Filled 2023-11-18: qty 1

## 2023-11-18 MED ORDER — PROPOFOL 10 MG/ML IV BOLUS
INTRAVENOUS | Status: AC
  Filled 2023-11-18: qty 20

## 2023-11-18 MED ORDER — SUCCINYLCHOLINE CHLORIDE 200 MG/10ML IV SOSY
PREFILLED_SYRINGE | INTRAVENOUS | Status: DC | PRN
  Administered 2023-11-18: 20 mg via INTRAVENOUS

## 2023-11-18 MED ORDER — FENTANYL CITRATE (PF) 100 MCG/2ML IJ SOLN: 25.0000 ug | INTRAMUSCULAR | Status: DC | PRN

## 2023-11-18 MED ORDER — OXYCODONE HCL 5 MG PO TABS: 5.0000 mg | ORAL_TABLET | Freq: Once | ORAL | Status: DC | PRN

## 2023-11-18 MED ORDER — LACTATED RINGERS IV SOLN: INTRAVENOUS | Status: DC | PRN

## 2023-11-18 MED ORDER — CEFAZOLIN SODIUM-DEXTROSE 2-4 GM/100ML-% IV SOLN
2.0000 g | INTRAVENOUS | Status: AC
  Administered 2023-11-18: 2 g via INTRAVENOUS
  Filled 2023-11-18: qty 100

## 2023-11-18 MED ORDER — METHOCARBAMOL 500 MG PO TABS: 500.0000 mg | ORAL_TABLET | Freq: Four times a day (QID) | ORAL | Status: DC | PRN

## 2023-11-18 MED ORDER — SODIUM CHLORIDE 0.9 % IV SOLN
250.0000 mL | INTRAVENOUS | Status: DC
  Administered 2023-11-18: 250 mL via INTRAVENOUS

## 2023-11-18 MED ORDER — SPIRONOLACTONE 25 MG PO TABS
25.0000 mg | ORAL_TABLET | Freq: Every day | ORAL | Status: DC
  Administered 2023-11-18: 25 mg via ORAL
  Filled 2023-11-18 (×2): qty 1

## 2023-11-18 MED ORDER — 0.9 % SODIUM CHLORIDE (POUR BTL) OPTIME
TOPICAL | Status: DC | PRN
  Administered 2023-11-18: 1000 mL

## 2023-11-18 MED ORDER — HYDROMORPHONE HCL 1 MG/ML IJ SOLN
INTRAMUSCULAR | Status: DC | PRN
  Administered 2023-11-18: .5 mg via INTRAVENOUS

## 2023-11-18 MED ORDER — HYDROMORPHONE HCL 1 MG/ML IJ SOLN
INTRAMUSCULAR | Status: AC
  Filled 2023-11-18: qty 0.5

## 2023-11-18 MED ORDER — PHENYLEPHRINE 80 MCG/ML (10ML) SYRINGE FOR IV PUSH (FOR BLOOD PRESSURE SUPPORT)
PREFILLED_SYRINGE | INTRAVENOUS | Status: DC | PRN
  Administered 2023-11-18: 160 ug via INTRAVENOUS
  Administered 2023-11-18 (×2): 80 ug via INTRAVENOUS
  Administered 2023-11-18: 160 ug via INTRAVENOUS
  Administered 2023-11-18: 80 ug via INTRAVENOUS
  Administered 2023-11-18: 160 ug via INTRAVENOUS

## 2023-11-18 SURGICAL SUPPLY — 55 items
BAG COUNTER SPONGE SURGICOUNT (BAG) ×1 IMPLANT
BENZOIN TINCTURE PRP APPL 2/3 (GAUZE/BANDAGES/DRESSINGS) IMPLANT
BIT DRILL NEURO 2X3.1 SFT TUCH (MISCELLANEOUS) ×1 IMPLANT
BLADE CLIPPER SURG (BLADE) IMPLANT
BUR CARBIDE MATCH 3.0 (BURR) ×1 IMPLANT
CANISTER SUCT 3000ML PPV (MISCELLANEOUS) ×1 IMPLANT
COVER MAYO STAND STRL (DRAPES) ×1 IMPLANT
DEVICE ENDSKLTN IMPL 16X14X7X6 (Cage) IMPLANT
DEVICE ENDSKLTN TC NANOLCK 6MM (Cage) IMPLANT
DRAPE C-ARM 42X72 X-RAY (DRAPES) ×1 IMPLANT
DRAPE HALF SHEET 40X57 (DRAPES) IMPLANT
DRAPE LAPAROTOMY 100X72X124 (DRAPES) ×1 IMPLANT
DRAPE MICROSCOPE SLANT 54X150 (MISCELLANEOUS) ×1 IMPLANT
DRILL NEURO 2X3.1 SOFT TOUCH (MISCELLANEOUS) ×1
DRSG OPSITE POSTOP 4X6 (GAUZE/BANDAGES/DRESSINGS) IMPLANT
DURAPREP 6ML APPLICATOR 50/CS (WOUND CARE) ×1 IMPLANT
ELECT COATED BLADE 2.86 ST (ELECTRODE) ×1 IMPLANT
ELECT REM PT RETURN 9FT ADLT (ELECTROSURGICAL) ×1
ELECTRODE REM PT RTRN 9FT ADLT (ELECTROSURGICAL) ×1 IMPLANT
ENDOSKELETON IMPLANT 16X14X7X6 (Cage) ×1 IMPLANT
ENDOSKELETON TC NANOLOCK 6MM (Cage) ×1 IMPLANT
EVACUATOR 1/8 PVC DRAIN (DRAIN) IMPLANT
GAUZE 4X4 16PLY ~~LOC~~+RFID DBL (SPONGE) IMPLANT
GLOVE BIO SURGEON STRL SZ7 (GLOVE) ×1 IMPLANT
GLOVE BIOGEL PI IND STRL 7.5 (GLOVE) ×1 IMPLANT
GLOVE BIOGEL PI IND STRL 8 (GLOVE) ×1 IMPLANT
GLOVE ECLIPSE 8.0 STRL XLNG CF (GLOVE) ×2 IMPLANT
GLOVE EXAM NITRILE LRG STRL (GLOVE) IMPLANT
GLOVE EXAM NITRILE XL STR (GLOVE) IMPLANT
GLOVE EXAM NITRILE XS STR PU (GLOVE) IMPLANT
GOWN STRL REUS W/ TWL LRG LVL3 (GOWN DISPOSABLE) IMPLANT
GOWN STRL REUS W/ TWL XL LVL3 (GOWN DISPOSABLE) ×2 IMPLANT
GOWN STRL REUS W/TWL 2XL LVL3 (GOWN DISPOSABLE) IMPLANT
HEMOSTAT POWDER KIT SURGIFOAM (HEMOSTASIS) ×1 IMPLANT
KIT BASIN OR (CUSTOM PROCEDURE TRAY) ×1 IMPLANT
KIT TURNOVER KIT B (KITS) ×1 IMPLANT
NDL SPNL 18GX3.5 QUINCKE PK (NEEDLE) ×1 IMPLANT
NEEDLE SPNL 18GX3.5 QUINCKE PK (NEEDLE) ×1 IMPLANT
NS IRRIG 1000ML POUR BTL (IV SOLUTION) ×1 IMPLANT
PACK LAMINECTOMY NEURO (CUSTOM PROCEDURE TRAY) ×1 IMPLANT
PAD ARMBOARD 7.5X6 YLW CONV (MISCELLANEOUS) ×3 IMPLANT
PATTIES SURGICAL .5 X.5 (GAUZE/BANDAGES/DRESSINGS) IMPLANT
PATTIES SURGICAL 1X1 (DISPOSABLE) IMPLANT
PIN DISTRACTION 14MM (PIN) ×2 IMPLANT
PLATE 39MM (Plate) IMPLANT
PUTTY BONE DBX 2.5 MIS (Bone Implant) IMPLANT
SCREW VA SD 3.5X16 (Screw) IMPLANT
SPONGE INTESTINAL PEANUT (DISPOSABLE) ×1 IMPLANT
SPONGE SURGIFOAM ABS GEL SZ50 (HEMOSTASIS) ×1 IMPLANT
STAPLER VISISTAT 35W (STAPLE) IMPLANT
STRIP CLOSURE SKIN 1/2X4 (GAUZE/BANDAGES/DRESSINGS) ×1 IMPLANT
TAPE SURG TRANSPORE 1 IN (GAUZE/BANDAGES/DRESSINGS) ×1 IMPLANT
TOWEL GREEN STERILE (TOWEL DISPOSABLE) ×1 IMPLANT
TOWEL GREEN STERILE FF (TOWEL DISPOSABLE) ×1 IMPLANT
WATER STERILE IRR 1000ML POUR (IV SOLUTION) ×1 IMPLANT

## 2023-11-18 NOTE — Transfer of Care (Signed)
Immediate Anesthesia Transfer of Care Note  Patient: Evan [name]  Procedure(s) Performed: Anterior Cervical Discectomy/Decompression Fusion Cervical Five-Six, Cervical Six-Seven  Patient Location: PACU  Anesthesia Type:General  Level of Consciousness: oriented, drowsy, and patient cooperative  Airway & Oxygen Therapy: Patient Spontanous Breathing and Patient connected to face mask oxygen  Post-op Assessment: Report given to RN and Post -op Vital signs reviewed and stable  Post vital signs: Reviewed and stable  Last Vitals:  Vitals Value Taken Time  BP 107/77 11/18/23 1110  Temp    Pulse 98 11/18/23 1113  Resp 18 11/18/23 1113  SpO2 91 % 11/18/23 1113  Vitals shown include unfiled device data.  Last Pain:  Vitals:   11/18/23 0600  TempSrc:   PainSc: 3          Complications: No notable events documented.

## 2023-11-18 NOTE — Op Note (Signed)
Providing Compassionate, Quality Care - Together  Date of service: 11/18/2023  PREOP DIAGNOSIS: Cervical spondylosis, bilateral neuroforaminal stenosis with radiculopathy, C5-6, C6-7  POSTOP DIAGNOSIS: Same  PROCEDURE: 1. Arthrodesis C5-6, C6-7, anterior interbody technique  2. Placement of intervertebral biomechanical device C5-6, C6-7: C5-6: Medtronic Titan 6 mm interbody, C6-7: Medtronic Titan 7mm titanium interbody 3. Placement of anterior instrumentation consisting of interbody plate and screws -C5-6, C6-7: Medtronic zevo 39 mm plate, 16 mm screws bilaterally at C5, C6, C7 4. Discectomy at C5-6, C6-7 for decompression of spinal cord and exiting nerve roots  5. Use of morselized bone allograft  6. Use of intraoperative microscope 7.  Use of autograft, same incision  SURGEON: Dr. Monia Pouch, DO  ASSISTANT: Dr. Lisbeth Renshaw, MD; Patrici Ranks, PA  ANESTHESIA: General Endotracheal  EBL: 25 cc  SPECIMENS: None  DRAINS: None  COMPLICATIONS: None immediate  CONDITION: Hemodynamically stable to PACU  HISTORY: Evan [name] is a 57 y.o. y.o. male who initially presented to the outpatient clinic with signs and symptoms consistent with worsening bilateral upper extremity C6 and C7 radiculopathy.  He had a history of a C4-5 ACDF many years ago. MRI demonstrated adjacent segment degeneration, spondylosis and disc osteophyte complex causing neuroforaminal stenosis at C5-6 and C6-7. Treatment options were discussed including epidural injections, pain control and physical therapy.  He failed multiple conservative measures and his pain became intractable therefore offered him ACDF C5-7, possible removal of hardware at C4-5.  We discussed all risks, benefits and expected outcomes. After all questions were answered, informed consent was obtained.  PROCEDURE IN DETAIL: The patient was brought to the operating room and transferred to the operative table. After induction of general  anesthesia, the patient was positioned on the operative table in the supine position with all pressure points meticulously padded. The skin of the neck was then prepped and draped in the usual sterile fashion.  Physician driven timeout was performed.  After timeout was conducted, skin incision was then made sharply with a 10 blade and Bovie electrocautery was used to dissect the subcutaneous tissue until the platysma was identified. The platysma was then divided and undermined. The sternocleidomastoid muscle was then identified and, utilizing natural fascial planes in the neck, the prevertebral fascia was identified and the carotid sheath was retracted laterally and the trachea and esophagus retracted medially. Again using fluoroscopy, the correct disc space was identified. Bovie electrocautery was used to dissect in the subperiosteal plane and elevate the bilateral longus coli muscles. Self-retaining retractors were then placed under the longus coli muscles bilaterally. At this point, the microscope was draped and brought into the field, and the remainder of the case was done under the microscope using microdissecting technique.  ACDF C5-6: Distraction pins were placed in midline above and below the disc space, lateral fluoroscopy confirmed the appropriate space.  The prior hardware at C4-5 did not appear to be in the way for a anterior cervical plate from W4-1 and therefore I did not feel as though it needed to be removed.  The disc  space was placed in distraction.  The disc space was incised sharply and rongeurs were use to initially complete a discectomy. The high-speed drill was then used to complete discectomy until the posterior annulus was identified and removed and the posterior longitudinal ligament was identified. Using microcurettes, the PLL was elevated, and Kerrison rongeurs were used to remove the posterior longitudinal ligament and the ventral thecal sac was identified. Using a combination of  curettes and  ronguers, complete decompression of the thecal sac and exiting nerve roots at this level was completed, and verified using micro-nerve hook. The disc space was taken out of distraction.  Epidural hemostasis was achieved with Surgifoam.  Having completed our decompression, attention was turned to placement of the intervertebral device. Trial spacers were used to select a 6 mm graft. This graft was then filled with autograft and morcellized allograft, and inserted under live fluoroscopy.   ACDF C5-6: Distraction pins were placed in midline above and below the disc space, lateral fluoroscopy confirmed the appropriate space. The disc  space was placed in distraction.  The disc space was incised sharply and rongeurs were use to initially complete a discectomy. The high-speed drill was then used to complete discectomy until the posterior annulus was identified and removed and the posterior longitudinal ligament was identified. Using microcurettes, the PLL was elevated, and Kerrison rongeurs were used to remove the posterior longitudinal ligament and the ventral thecal sac was identified. Using a combination of curettes and ronguers, complete decompression of the thecal sac and exiting nerve roots at this level was completed, and verified using micro-nerve hook. The disc space was taken out of distraction.  Epidural hemostasis was achieved with Surgifoam.  Having completed our decompression, attention was turned to placement of the intervertebral device. Trial spacers were used to select a 7 mm graft. This graft was then filled with autograft and morcellized allograft, and inserted under live fluoroscopy.  After placement of the intervertebral device, the above anterior cervical plate was selected, and placed across the interspace. Using a high-speed drill, the cortex of the cervical vertebral bodies was punctured, and screws inserted in the level C5, C6, C7 with appropriate bony purchase. Final  fluoroscopic images in AP and lateral projections were taken to confirm good hardware placement.  The plate was final tightened to the manufacturer's recommendation and the screws were locked in place.  At this point, after all counts were verified to be correct, meticulous hemostasis was secured using a combination of bipolar electrocautery and passive hemostatics.  Skin was closed with staples.  Sterile dressing was applied.  The patient tolerated the procedure well and was extubated in the room and taken to the postanesthesia care unit in stable condition.

## 2023-11-18 NOTE — Anesthesia Procedure Notes (Signed)
Procedure Name: Intubation Date/Time: 11/18/2023 7:44 AM  Performed by: Orlin Hilding, CRNAPre-anesthesia Checklist: Patient identified, Emergency Drugs available, Suction available, Patient being monitored and Timeout performed Patient Re-evaluated:Patient Re-evaluated prior to induction Oxygen Delivery Method: Circle system utilized Preoxygenation: Pre-oxygenation with 100% oxygen Induction Type: IV induction Ventilation: Mask ventilation without difficulty and Oral airway inserted - appropriate to patient size Laryngoscope Size: Glidescope and 4 Grade View: Grade I Tube type: Oral Tube size: 7.0 mm Number of attempts: 1 Placement Confirmation: ETT inserted through vocal cords under direct vision, positive ETCO2 and breath sounds checked- equal and bilateral Secured at: 25 cm Tube secured with: Tape Dental Injury: Teeth and Oropharynx as per pre-operative assessment

## 2023-11-18 NOTE — Plan of Care (Signed)
  Problem: Education: Goal: Knowledge of General Education information will improve Description: Including pain rating scale, medication(s)/side effects and non-pharmacologic comfort measures Outcome: Progressing   Problem: Nutrition: Goal: Adequate nutrition will be maintained Outcome: Progressing   Problem: Elimination: Goal: Will not experience complications related to bowel motility Outcome: Progressing Goal: Will not experience complications related to urinary retention Outcome: Progressing   Problem: Pain Management: Goal: General experience of comfort will improve Outcome: Progressing   Problem: Skin Integrity: Goal: Risk for impaired skin integrity will decrease Outcome: Progressing   Problem: Activity: Goal: Ability to avoid complications of mobility impairment will improve Outcome: Progressing Goal: Ability to tolerate increased activity will improve Outcome: Progressing Goal: Will remain free from falls Outcome: Progressing

## 2023-11-18 NOTE — Progress Notes (Signed)
Orthopedic Tech Progress Note Patient Details:  Evan [name] 1966/03/06 [id number] Left Aspen cervical collar at bedside  Ortho Devices Type of Ortho Device: Aspen cervical collar Ortho Device/Splint Interventions: Theotis Burrow 11/18/2023, 11:27 AM

## 2023-11-18 NOTE — Anesthesia Preprocedure Evaluation (Signed)
Anesthesia Evaluation  Patient identified by MRN, date of birth, ID band Patient awake    Reviewed: Allergy & Precautions, H&P , NPO status , Patient's Chart, lab work & pertinent test results  Airway Mallampati: II   Neck ROM: full    Dental   Pulmonary sleep apnea    breath sounds clear to auscultation       Cardiovascular hypertension,  Rhythm:regular Rate:Normal     Neuro/Psych  Headaches, Seizures -,     GI/Hepatic ,GERD  ,,  Endo/Other  diabetes, Type 2    Renal/GU      Musculoskeletal  (+) Arthritis ,    Abdominal   Peds  Hematology   Anesthesia Other Findings   Reproductive/Obstetrics                             Anesthesia Physical Anesthesia Plan  ASA: 3  Anesthesia Plan: General   Post-op Pain Management:    Induction: Intravenous  PONV Risk Score and Plan: 2 and Ondansetron, Dexamethasone, Midazolam and Treatment may vary due to age or medical condition  Airway Management Planned: Oral ETT and Video Laryngoscope Planned  Additional Equipment:   Intra-op Plan:   Post-operative Plan: Extubation in OR  Informed Consent: I have reviewed the patients History and Physical, chart, labs and discussed the procedure including the risks, benefits and alternatives for the proposed anesthesia with the patient or authorized representative who has indicated his/her understanding and acceptance.     Dental advisory given  Plan Discussed with: CRNA, Anesthesiologist and Surgeon  Anesthesia Plan Comments:        Anesthesia Quick Evaluation

## 2023-11-18 NOTE — H&P (Signed)
Providing Compassionate, Quality Care - Together  NEUROSURGERY HISTORY & PHYSICAL   Evan [name] is an 57 y.o. male.   Chief Complaint: Cervical radiculopathy HPI: This is a 57 year old male with a history of C4-5 anterior cervical fusion years ago, with complaints of worsening neck pain and bilateral radiculopathy.  This has been progressively worsening over many months, altering his daily lifestyle.  He failed multiple conservative measures, MRI revealed adjacent segment spondylosis, with stenosis at C5-6 and C6-7 therefore he presents today for surgical intervention.  Past Medical History:  Diagnosis Date   Arthritis    Chronic cough 01/05/2020   Chronic kidney disease    Colon polyps    benign per pt   Diverticulitis    GERD (gastroesophageal reflux disease)    Headache    Hyperlipidemia    Hypertension    Lyme disease    Seizures (HCC)    Sleep apnea    CPAP    Past Surgical History:  Procedure Laterality Date   CARDIAC CATHETERIZATION     CHOLECYSTECTOMY  2021   COLONOSCOPY WITH PROPOFOL N/A 04/24/2022   Procedure: COLONOSCOPY WITH PROPOFOL;  Surgeon: Jeani Hawking, MD;  Location: [facility];  Service: Gastroenterology;  Laterality: N/A;   HERNIA REPAIR     x 2   LEFT HEART CATH AND CORONARY ANGIOGRAPHY N/A 05/04/2018   Procedure: LEFT HEART CATH AND CORONARY ANGIOGRAPHY;  Surgeon: Marykay Lex, MD;  Location: [facility] INVASIVE CV LAB;  Service: Cardiovascular;  Laterality: N/A;   MENISCUS REPAIR     2022 and 2023   NECK SURGERY     NEPHRECTOMY Left 2010   for atrophy and reflux   POLYPECTOMY  04/24/2022   Procedure: POLYPECTOMY;  Surgeon: Jeani Hawking, MD;  Location: [facility];  Service: Gastroenterology;;   PROSTATE BIOPSY     SHOULDER SURGERY Left     Family History  Problem Relation Age of Onset   Arthritis Mother    Heart disease Mother    Hypertension Mother    Kidney disease Mother    Colon cancer Father        dx in his early 43's    Lung cancer Father    Prostate cancer Father    Breast cancer Father        30 or 1995   Lung cancer Brother        smoker   Lung cancer Brother        smoker   Bladder Cancer Brother    Lung cancer Paternal Aunt    Colon cancer Paternal Uncle    Diabetes Paternal Uncle    Arthritis Maternal Grandmother    Colon cancer Maternal Grandmother    Heart disease Maternal Grandmother    Heart disease Maternal Grandfather    Diabetes Paternal Grandfather    Social History:  reports that he has never smoked. He has never used smokeless tobacco. He reports that he does not currently use alcohol. He reports that he does not use drugs.  Allergies:  Allergies  Allergen Reactions   Nuvigil [Armodafinil] Other (See Comments)    Seizures    Seroquel [Quetiapine] Other (See Comments)    Seizures.   Simvastatin Hives    Medications Prior to Admission  Medication Sig Dispense Refill   olmesartan (BENICAR) 20 MG tablet TAKE 1 TABLET (20 MG TOTAL) BY MOUTH DAILY. FOR BLOOD PRESSURE. 90 tablet 2   omeprazole (PRILOSEC) 20 MG capsule Take 1 capsule (20 mg total) by mouth  daily. 60 capsule 3   spironolactone (ALDACTONE) 25 MG tablet Take 1 tablet (25 mg total) by mouth daily. for blood pressure. 90 tablet 3   docusate sodium (COLACE) 100 MG capsule Take 1 capsule (100 mg total) by mouth 2 (two) times daily. (Patient taking differently: Take 100 mg by mouth daily as needed for mild constipation or moderate constipation.) 60 capsule 2   furosemide (LASIX) 20 MG tablet Take 1 tablet (20mg ) AS NEEDED for wt gain of 3lbs overnight or 5 lbs in one week. 30 tablet 0   ondansetron (ZOFRAN) 4 MG tablet Take 1 tablet (4 mg total) by mouth every 6 (six) hours as needed for nausea. (Patient not taking: Reported on 11/08/2023) 20 tablet 0   polyethylene glycol (MIRALAX) 17 g packet Take 17 g by mouth daily. (Patient taking differently: Take 17 g by mouth daily as needed for mild constipation or moderate  constipation.) 14 each 0    Results for orders placed or performed during the hospital encounter of 11/18/23 (from the past 48 hour(s))  Glucose, capillary     Status: Abnormal   Collection Time: 11/18/23  6:40 AM  Result Value Ref Range   Glucose-Capillary 157 (H) 70 - 99 mg/dL    Comment: Glucose reference range applies only to samples taken after fasting for at least 8 hours.   No results found.  ROS All pertinent positives and negatives are listed in HPI above  Blood pressure 121/75, pulse 70, temperature 97.6 F (36.4 C), temperature source Oral, resp. rate 18, height 5\' 10"  (1.778 m), weight 113.3 kg, SpO2 94%. Physical Exam  Awake alert oriented x 3, no acute distress Nonlabored breathing Speech fluent appropriate PERRLA Cranial nerves II through XII intact Full strength in upper and lower extremities Decreased sensation and light touch in the C6 and C7 distribution left greater than right  Assessment/Plan 57 year old male with  C5-7 cervical spondylosis, stenosis and radiculopathy  -OR today for ACDF C5-7.  We discussed all risks, benefits and expected outcomes as well as alternatives to treatment.  Informed consent was obtained and witnessed.  We discussed possible hardware removal at C4-5 depending on intraoperative decisions.  He verbalized understanding.   Thank you for allowing me to participate in this patient's care.  Please do not hesitate to call with questions or concerns.   Monia Pouch, DO Neurosurgeon [facility] [phone]

## 2023-11-19 ENCOUNTER — Encounter (HOSPITAL_COMMUNITY): Payer: Self-pay | Admitting: Neurological Surgery

## 2023-11-19 ENCOUNTER — Other Ambulatory Visit (HOSPITAL_COMMUNITY): Payer: Self-pay

## 2023-11-19 DIAGNOSIS — M4722 Other spondylosis with radiculopathy, cervical region: Secondary | ICD-10-CM | POA: Diagnosis not present

## 2023-11-19 DIAGNOSIS — I129 Hypertensive chronic kidney disease with stage 1 through stage 4 chronic kidney disease, or unspecified chronic kidney disease: Secondary | ICD-10-CM | POA: Diagnosis not present

## 2023-11-19 DIAGNOSIS — Z79899 Other long term (current) drug therapy: Secondary | ICD-10-CM | POA: Diagnosis not present

## 2023-11-19 DIAGNOSIS — M4802 Spinal stenosis, cervical region: Secondary | ICD-10-CM | POA: Diagnosis not present

## 2023-11-19 DIAGNOSIS — N189 Chronic kidney disease, unspecified: Secondary | ICD-10-CM | POA: Diagnosis not present

## 2023-11-19 MED ORDER — HYDROCODONE-ACETAMINOPHEN 5-325 MG PO TABS
1.0000 | ORAL_TABLET | ORAL | 0 refills | Status: DC | PRN
  Filled 2023-11-19: qty 20, 4d supply, fill #0

## 2023-11-19 MED ORDER — METHOCARBAMOL 500 MG PO TABS
500.0000 mg | ORAL_TABLET | Freq: Four times a day (QID) | ORAL | 0 refills | Status: DC | PRN
  Filled 2023-11-19: qty 120, 30d supply, fill #0

## 2023-11-19 MED FILL — Thrombin For Soln 5000 Unit: CUTANEOUS | Qty: 2 | Status: AC

## 2023-11-19 NOTE — Progress Notes (Signed)
PT Cancellation Note  Patient Details Name: Evan [name] MRN: [id number] DOB: Jul 15, 1966   Cancelled Treatment:    Reason Eval/Treat Not Completed: PT screened, no needs identified, will sign off. Pt independent.   Ilda Foil 11/19/2023, 9:07 AM

## 2023-11-19 NOTE — Discharge Summary (Signed)
  Patient ID: Evan [name] MRN: [id number] DOB/AGE: 59/08/1966 59 y.o.  Admit date: 11/18/2023 Discharge date: 11/19/2023  Admission Diagnoses: Cervical radiculopathy [M54.12]   Discharge Diagnoses: Same   Discharged Condition: Stable  Hospital Course:  Evan [name] is a 57 y.o. male who was admitted following an uncomplicated ACDF C5-7. They were recovered in PACU and transferred to [facility]. Hospital course was uncomplicated. Pt stable for discharge today. Pt to f/u in office for routine post op visit. Pt is in agreement w/ plan.    Discharge Exam: Blood pressure 115/71, pulse 88, temperature 98.3 F (36.8 C), temperature source Oral, resp. rate 20, height 5\' 10"  (1.778 m), weight 113.3 kg, SpO2 96%.  A&O x3 Speech fluent, appropriate Strength 5/5 x4.  SILTx4.  Dressing c/d/I.   Disposition: Discharge disposition: 01-Home or Self Care       Discharge Instructions     Incentive spirometry RT   Complete by: As directed       Allergies as of 11/19/2023       Reactions   Nuvigil [armodafinil] Other (See Comments)   Seizures   Seroquel [quetiapine] Other (See Comments)   Seizures.   Simvastatin Hives        Medication List     TAKE these medications    docusate sodium 100 MG capsule Commonly known as: Colace Take 1 capsule (100 mg total) by mouth 2 (two) times daily. What changed:  when to take this reasons to take this   furosemide 20 MG tablet Commonly known as: LASIX Take 1 tablet (20mg ) AS NEEDED for wt gain of 3lbs overnight or 5 lbs in one week.   HYDROcodone-acetaminophen 5-325 MG tablet Commonly known as: NORCO/VICODIN Take 1 tablet by mouth every 4 (four) hours as needed for moderate pain (pain score 4-6).   methocarbamol 500 MG tablet Commonly known as: ROBAXIN Take 1 tablet (500 mg total) by mouth every 6 (six) hours as needed for muscle spasms.   olmesartan 20 MG tablet Commonly known as: BENICAR TAKE 1 TABLET (20 MG TOTAL) BY MOUTH  DAILY. FOR BLOOD PRESSURE.   omeprazole 20 MG capsule Commonly known as: PRILOSEC Take 1 capsule (20 mg total) by mouth daily.   ondansetron 4 MG tablet Commonly known as: ZOFRAN Take 1 tablet (4 mg total) by mouth every 6 (six) hours as needed for nausea.   polyethylene glycol 17 g packet Commonly known as: MiraLax Take 17 g by mouth daily. What changed:  when to take this reasons to take this   spironolactone 25 MG tablet Commonly known as: ALDACTONE Take 1 tablet (25 mg total) by mouth daily. for blood pressure.         Signed: Clovis Riley 11/19/2023, 9:42 AM

## 2023-11-19 NOTE — Progress Notes (Signed)
Patient alert and oriented, surgical site clean and dry no sign of infection, ambulate and void. D/c instructions explain and given. All answered given.

## 2023-11-19 NOTE — Anesthesia Postprocedure Evaluation (Signed)
Anesthesia Post Note  Patient: Evan [name]  Procedure(s) Performed: Anterior Cervical Discectomy/Decompression Fusion Cervical Five-Six, Cervical Six-Seven     Patient location during evaluation: PACU Anesthesia Type: General Level of consciousness: awake and alert Pain management: pain level controlled Vital Signs Assessment: post-procedure vital signs reviewed and stable Respiratory status: spontaneous breathing, nonlabored ventilation, respiratory function stable and patient connected to nasal cannula oxygen Cardiovascular status: blood pressure returned to baseline and stable Postop Assessment: no apparent nausea or vomiting Anesthetic complications: no   No notable events documented.  Last Vitals:  Vitals:   11/19/23 0353 11/19/23 0741  BP: 109/76 115/71  Pulse: 78 88  Resp: 18 20  Temp: 36.8 C 36.8 C  SpO2: 98% 96%    Last Pain:  Vitals:   11/19/23 0758  TempSrc:   PainSc: 1                  Abryanna Musolino S

## 2023-11-19 NOTE — Evaluation (Signed)
Occupational Therapy Evaluation Patient Details Name: Evan [name] MRN: [id number] DOB: [birth year] Today's Date: 11/19/2023   History of Present Illness 57 yo M s/p ACDF.  PMH includes surgeries to L shoulder, B knees and prior ACDF.   Clinical Impression   Patient admitted for the diagnosis above.  PTA he continues to be independent with all mobility, ADL and iADL.  No significant changes noted to functional status and mobility.  Up and walking without assist, and no help for ADL completion.  Precautions reviewed with verbalized understanding, and patient is able to manipulate cervical brace without assist.  No further OT needs in the acute setting.  Recommend follow up with MD as prescribed.         If plan is discharge home, recommend the following: Assist for transportation    Functional Status Assessment  Patient has not had a recent decline in their functional status  Equipment Recommendations  None recommended by OT    Recommendations for Other Services       Precautions / Restrictions Precautions Precautions: Cervical Precaution Booklet Issued: Yes (comment) Required Braces or Orthoses: Cervical Brace Cervical Brace: Hard collar;For comfort Restrictions Weight Bearing Restrictions: No      Mobility Bed Mobility                    Transfers Overall transfer level: Independent                        Balance Overall balance assessment: No apparent balance deficits (not formally assessed)                                         ADL either performed or assessed with clinical judgement   ADL Overall ADL's : At baseline                                             Vision Baseline Vision/History: 1 Wears glasses Patient Visual Report: No change from baseline       Perception Perception: Not tested       Praxis Praxis: Not tested       Pertinent Vitals/Pain Pain Assessment Pain Assessment:  Faces Faces Pain Scale: Hurts a little bit Pain Location: Incisional/neck Pain Descriptors / Indicators: Sore Pain Intervention(s): Monitored during session     Extremity/Trunk Assessment Upper Extremity Assessment Upper Extremity Assessment: Overall WFL for tasks assessed   Lower Extremity Assessment Lower Extremity Assessment: Overall WFL for tasks assessed   Cervical / Trunk Assessment Cervical / Trunk Assessment: Neck Surgery   Communication     Cognition Arousal: Alert Behavior During Therapy: WFL for tasks assessed/performed Overall Cognitive Status: Within Functional Limits for tasks assessed                                       General Comments   VSS    Exercises     Shoulder Instructions      Home Living Family/patient expects to be discharged to:: Private residence Living Arrangements: Spouse/significant other Available Help at Discharge: Family;Available 24 hours/day Type of Home: House Home Access: Stairs to enter [employer] of Steps: 6 Entrance Stairs-Rails: Left  Home Layout: One level     Bathroom Shower/Tub: Producer, television/film/video: Standard Bathroom Accessibility: Yes How Accessible: Accessible via walker Home Equipment: Shower seat          Prior Functioning/Environment Prior Level of Function : Independent/Modified Independent;Driving;Working/employed                        OT Problem List: Pain      OT Treatment/Interventions:      OT Goals(Current goals can be found in the care plan section) Acute Rehab OT Goals Patient Stated Goal: Return home OT Goal Formulation: With patient Time For Goal Achievement: 11/22/23 Potential to Achieve Goals: Good  OT Frequency:      Co-evaluation              AM-PAC OT "6 Clicks" Daily Activity     Outcome Measure Help from another person eating meals?: None Help from another person taking care of personal grooming?: None Help from  another person toileting, which includes using toliet, bedpan, or urinal?: None Help from another person bathing (including washing, rinsing, drying)?: None Help from another person to put on and taking off regular upper body clothing?: None Help from another person to put on and taking off regular lower body clothing?: None 6 Click Score: 24   End of Session Nurse Communication: Mobility status  Activity Tolerance: Patient tolerated treatment well Patient left: in chair  OT Visit Diagnosis: Unsteadiness on feet (R26.81)                Time: 9147-8295 OT Time Calculation (min): 24 min Charges:  OT General Charges $OT Visit: 1 Visit OT Evaluation $OT Eval Moderate Complexity: 1 Mod OT Treatments $Self Care/Home Management : 8-22 mins  11/19/2023  RP, OTR/L  Acute Rehabilitation Services  Office:  [phone]   Evan [name] 11/19/2023, 8:39 AM

## 2023-11-29 DIAGNOSIS — M4722 Other spondylosis with radiculopathy, cervical region: Secondary | ICD-10-CM | POA: Diagnosis not present

## 2023-12-04 ENCOUNTER — Other Ambulatory Visit: Payer: Self-pay | Admitting: Primary Care

## 2023-12-04 DIAGNOSIS — I10 Essential (primary) hypertension: Secondary | ICD-10-CM

## 2023-12-06 NOTE — Telephone Encounter (Signed)
LVM for patient to call and schedule

## 2023-12-06 NOTE — Telephone Encounter (Signed)
 Patient is due for CPE/follow up in late January 2025, this will be required prior to any further refills.  Please schedule, thank you!

## 2023-12-13 ENCOUNTER — Ambulatory Visit: Payer: BLUE CROSS/BLUE SHIELD | Admitting: General Practice

## 2023-12-14 ENCOUNTER — Ambulatory Visit
Admission: RE | Admit: 2023-12-14 | Discharge: 2023-12-14 | Disposition: A | Discharge status: Home / Self Care | Payer: BLUE CROSS/BLUE SHIELD | Source: Ambulatory Visit | Attending: Family Medicine | Admitting: Family Medicine

## 2023-12-14 ENCOUNTER — Encounter: Payer: Self-pay | Admitting: Family Medicine

## 2023-12-14 ENCOUNTER — Ambulatory Visit: Payer: BLUE CROSS/BLUE SHIELD | Admitting: Family Medicine

## 2023-12-14 ENCOUNTER — Ambulatory Visit: Payer: Self-pay | Admitting: Primary Care

## 2023-12-14 VITALS — BP 122/76 | HR 76 | Temp 97.9°F | Ht 70.0 in | Wt 294.6 lb

## 2023-12-14 DIAGNOSIS — I82611 Acute embolism and thrombosis of superficial veins of right upper extremity: Secondary | ICD-10-CM | POA: Diagnosis not present

## 2023-12-14 DIAGNOSIS — M7989 Other specified soft tissue disorders: Secondary | ICD-10-CM | POA: Insufficient documentation

## 2023-12-14 DIAGNOSIS — I82619 Acute embolism and thrombosis of superficial veins of unspecified upper extremity: Secondary | ICD-10-CM | POA: Insufficient documentation

## 2023-12-14 DIAGNOSIS — M79601 Pain in right arm: Secondary | ICD-10-CM | POA: Diagnosis not present

## 2023-12-14 DIAGNOSIS — I808 Phlebitis and thrombophlebitis of other sites: Secondary | ICD-10-CM | POA: Diagnosis not present

## 2023-12-14 NOTE — Progress Notes (Signed)
 Camellia Her, MD Phone: [phone]  Evan [name] is a 57 y.o. male who presents today for same day visit.   Right forearm discomfort: Patient notes 3 days ago noting an area of soreness in his proximal volar aspect right forearm.  Notes it was a little red.  Notes the redness has resolved though he now has some swelling and a hard linear subcutaneous lesion noted.  He notes this has been spreading distally.  He notes his skin is sensitive in this area.  He said no fevers.  He feels generally at his baseline.  He did have a cervical spine fusion about 4 weeks ago.  Social History   Tobacco Use  Smoking Status Never  Smokeless Tobacco Never    Current Outpatient Medications on File Prior to Visit  Medication Sig Dispense Refill   docusate sodium  (COLACE) 100 MG capsule Take 1 capsule (100 mg total) by mouth 2 (two) times daily. (Patient taking differently: Take 100 mg by mouth daily as needed for mild constipation or moderate constipation.) 60 capsule 2   furosemide  (LASIX ) 20 MG tablet Take 1 tablet (20mg ) AS NEEDED for wt gain of 3lbs overnight or 5 lbs in one week. 30 tablet 0   HYDROcodone -acetaminophen  (NORCO/VICODIN) 5-325 MG tablet Take 1 tablet by mouth every 4 (four) hours as needed for moderate pain (pain score 4-6). 20 tablet 0   methocarbamol  (ROBAXIN ) 500 MG tablet Take 1 tablet (500 mg total) by mouth every 6 (six) hours as needed for muscle spasms. 120 tablet 0   olmesartan  (BENICAR ) 20 MG tablet TAKE 1 TABLET (20 MG TOTAL) BY MOUTH DAILY. FOR BLOOD PRESSURE. 30 tablet 0   omeprazole  (PRILOSEC) 20 MG capsule Take 1 capsule (20 mg total) by mouth daily. 60 capsule 3   ondansetron  (ZOFRAN ) 4 MG tablet Take 1 tablet (4 mg total) by mouth every 6 (six) hours as needed for nausea. 20 tablet 0   polyethylene glycol (MIRALAX ) 17 g packet Take 17 g by mouth daily. (Patient taking differently: Take 17 g by mouth daily as needed for mild constipation or moderate constipation.) 14  each 0   spironolactone  (ALDACTONE ) 25 MG tablet Take 1 tablet (25 mg total) by mouth daily. for blood pressure. 90 tablet 3   No current facility-administered medications on file prior to visit.     ROS see history of present illness  Objective  Physical Exam Vitals:   12/14/23 0936  BP: 122/76  Pulse: 76  Temp: 97.9 F (36.6 C)  SpO2: 97%    BP Readings from Last 3 Encounters:  12/14/23 122/76  11/19/23 115/71  11/10/23 126/81   Wt Readings from Last 3 Encounters:  12/14/23 294 lb 9.6 oz (133.6 kg)  11/18/23 249 lb 12.5 oz (113.3 kg)  11/10/23 249 lb 14.4 oz (113.4 kg)    Physical Exam Musculoskeletal:       Arms:      Assessment/Plan: Please see individual problem list.  Arm swelling Assessment & Plan: Acute onset issue.  Concern for likely thrombophlebitis though underlying DVT needs to be ruled out.  Ultrasound ordered.  Discussed use of warm compresses.  Once ultrasound returns would consider NSAIDs if it is a thrombophlebitis.  If DVT we will need to discuss anticoagulation.  Orders: -     US  Venous Img Upper Uni Right(DVT); Future    Return in about 1 week (around 12/21/2023) for With PCP follow-up right forearm swelling.   Camellia Her, MD [facility]

## 2023-12-14 NOTE — Assessment & Plan Note (Signed)
 Acute onset issue.  Concern for likely thrombophlebitis though underlying DVT needs to be ruled out.  Ultrasound ordered.  Discussed use of warm compresses.  Once ultrasound returns would consider NSAIDs if it is a thrombophlebitis.  If DVT we will need to discuss anticoagulation.

## 2023-12-14 NOTE — Telephone Encounter (Signed)
 Copied from CRM [id number]. Topic: Clinical - Red Word Triage >> Dec 14, 2023  3:12 PM Robinson H wrote: Red Word that prompted transfer to Nurse Triage: Tiara with [facility] is calling to report positive ultrasound results/     Chief Complaint: Critical image result  Additional Notes: Call received from Tiara with [facility] with critical ultrasound result-Ultrasound positive for basilic thrombus. Call placed to [facility] on-call provider, spoke with with Dr.Parker.  Dr. Kennyth advises patient to do warm compresses and take Ibuprofen  as needed for pain. This RN relayed this information to Tiara, no further questions as this time. Pt to keep follow-up appt on 01/07   Reason for Disposition  Lab or radiology calling with CRITICAL test results  Answer Assessment - Initial Assessment Questions 1. REASON FOR CALL or QUESTION: What is your reason for calling today? or How can I best help you? or What question do you have that I can help answer?     Critical Ultrasound result  2. CALLER: Document the source of call. (e.g., laboratory, patient).     Tiara, US  tech at [employer] used: PCP Call - No Triage-[facility]

## 2023-12-14 NOTE — Telephone Encounter (Addendum)
 Called and spoke with patient. Advised of finding of superficial thrombus. Discussed typical treatment of this is NSAIDs and warm compresses. Advised he could take ibuprofen  800 mg every 8 hours as needed. If he needs this longer term for this he will need his kidney function checked. If his symptoms are not improving he may need a repeat US . Forwarding to PCP so she is aware as the patient will be seeing her next week. The patient did not have any questions on this.

## 2023-12-15 NOTE — Telephone Encounter (Signed)
 Noted and appreciate Dr. Purvis Sheffield evaluation and attention to this patient. Will discuss next week with patient.

## 2023-12-23 ENCOUNTER — Encounter: Payer: Self-pay | Admitting: Primary Care

## 2023-12-23 ENCOUNTER — Ambulatory Visit (INDEPENDENT_AMBULATORY_CARE_PROVIDER_SITE_OTHER): Payer: BLUE CROSS/BLUE SHIELD | Admitting: Primary Care

## 2023-12-23 VITALS — BP 118/62 | HR 89 | Temp 97.2°F | Ht 70.0 in | Wt 298.0 lb

## 2023-12-23 DIAGNOSIS — Z1159 Encounter for screening for other viral diseases: Secondary | ICD-10-CM

## 2023-12-23 DIAGNOSIS — R1084 Generalized abdominal pain: Secondary | ICD-10-CM

## 2023-12-23 DIAGNOSIS — Z0000 Encounter for general adult medical examination without abnormal findings: Secondary | ICD-10-CM

## 2023-12-23 DIAGNOSIS — E785 Hyperlipidemia, unspecified: Secondary | ICD-10-CM

## 2023-12-23 DIAGNOSIS — Z125 Encounter for screening for malignant neoplasm of prostate: Secondary | ICD-10-CM | POA: Diagnosis not present

## 2023-12-23 DIAGNOSIS — I82611 Acute embolism and thrombosis of superficial veins of right upper extremity: Secondary | ICD-10-CM

## 2023-12-23 DIAGNOSIS — I200 Unstable angina: Secondary | ICD-10-CM

## 2023-12-23 DIAGNOSIS — I10 Essential (primary) hypertension: Secondary | ICD-10-CM

## 2023-12-23 DIAGNOSIS — E1165 Type 2 diabetes mellitus with hyperglycemia: Secondary | ICD-10-CM

## 2023-12-23 DIAGNOSIS — R079 Chest pain, unspecified: Secondary | ICD-10-CM

## 2023-12-23 DIAGNOSIS — K219 Gastro-esophageal reflux disease without esophagitis: Secondary | ICD-10-CM

## 2023-12-23 DIAGNOSIS — M5412 Radiculopathy, cervical region: Secondary | ICD-10-CM

## 2023-12-23 LAB — MICROALBUMIN / CREATININE URINE RATIO
Creatinine,U: 110.5 mg/dL
Microalb Creat Ratio: 0.9 mg/g (ref 0.0–30.0)
Microalb, Ur: 1 mg/dL (ref 0.0–1.9)

## 2023-12-23 LAB — LIPID PANEL
Cholesterol: 271 mg/dL — ABNORMAL HIGH (ref 0–200)
HDL: 39.9 mg/dL (ref >39.00)
LDL Cholesterol: 172 mg/dL — ABNORMAL HIGH (ref 0–99)
NonHDL: 231.17
Total CHOL/HDL Ratio: 7
Triglycerides: 298 mg/dL — ABNORMAL HIGH (ref 0.0–149.0)
VLDL: 59.6 mg/dL — ABNORMAL HIGH (ref 0.0–40.0)

## 2023-12-23 LAB — PSA: PSA: 0.6 ng/mL (ref 0.10–4.00)

## 2023-12-23 LAB — HEMOGLOBIN A1C: Hgb A1c MFr Bld: 7.7 % — ABNORMAL HIGH (ref 4.6–6.5)

## 2023-12-23 NOTE — Progress Notes (Signed)
 Subjective:    Patient ID: Evan [name], male    DOB: Aug 01, 1966, 58 y.o.   MRN: [id number]  HPI  Evan [name] is a very pleasant 58 y.o. male who presents today for complete physical and follow up of chronic conditions.  Immunizations: -Tetanus: Completed in 2023 -Influenza: Declines influenza vaccine.  -Shingles: Never completed -Pneumonia: Never completed   Diet: Fair diet.  Exercise: No regular exercise.  Eye exam: Completes annually  Dental exam: Completes semi-annually    Colonoscopy: Completed in 2023, due 2026  PSA: Due  BP Readings from Last 3 Encounters:  12/23/23 118/62  12/14/23 122/76  11/19/23 115/71      Review of Systems  Constitutional:  Negative for unexpected weight change.  HENT:  Negative for rhinorrhea.   Respiratory:  Negative for cough and shortness of breath.   Cardiovascular:  Negative for chest pain.  Gastrointestinal:  Negative for constipation and diarrhea.  Genitourinary:  Negative for difficulty urinating.  Musculoskeletal:  Positive for neck pain.  Skin:  Negative for rash.  Allergic/Immunologic: Negative for environmental allergies.  Neurological:  Positive for numbness. Negative for dizziness and headaches.  Psychiatric/Behavioral:  The patient is not nervous/anxious.          Past Medical History:  Diagnosis Date   Arthritis    Chronic cough 01/05/2020   Chronic kidney disease    Colon polyps    benign per pt   Diverticulitis    Elevated CK 06/14/2020   GERD (gastroesophageal reflux disease)    Headache    Hyperlipidemia    Hypertension    Intractable abdominal pain 07/27/2023   Lyme disease    Myalgia 11/04/2018   New daily persistent headache 12/31/2021   Nocturia 02/14/2021   Seizures (HCC)    Sleep apnea    CPAP   Weak urinary stream 12/31/2021    Social History   Socioeconomic History   Marital status: Married    Spouse name: Not on file   Number of children: 4   Years of education: Not on  file   Highest education level: Not on file  Occupational History   Occupation: production designer, theatre/television/film  Tobacco Use   Smoking status: Never   Smokeless tobacco: Never  Vaping Use   Vaping status: Never Used  Substance and Sexual Activity   Alcohol use: Not Currently    Comment: occasional   Drug use: No   Sexual activity: Not on file  Other Topics Concern   Not on file  Social History Narrative   Not on file   Social Drivers of Health   Financial Resource Strain: Not on file  Food Insecurity: No Food Insecurity (07/27/2023)   Hunger Vital Sign    Worried About Running Out of Food in the Last Year: Never true    Ran Out of Food in the Last Year: Never true  Transportation Needs: No Transportation Needs (07/27/2023)   PRAPARE - Administrator, Civil Service (Medical): No    Lack of Transportation (Non-Medical): No  Physical Activity: Not on file  Stress: Not on file  Social Connections: Unknown (04/28/2022)   Received from [facility]   Social Network    Social Network: Not on file  Intimate Partner Violence: Not At Risk (07/27/2023)   Humiliation, Afraid, Rape, and Kick questionnaire    Fear of Current or Ex-Partner: No    Emotionally Abused: No    Physically Abused: No    Sexually Abused: No  Past Surgical History:  Procedure Laterality Date   ANTERIOR CERVICAL DECOMP/DISCECTOMY FUSION N/A 11/18/2023   Procedure: Anterior Cervical Discectomy/Decompression Fusion Cervical Five-Six, Cervical Six-Seven;  Surgeon: Dawley, Lani BROCKS, DO;  Location: [facility] OR;  Service: Neurosurgery;  Laterality: N/A;  3C   CARDIAC CATHETERIZATION     CHOLECYSTECTOMY  2021   COLONOSCOPY WITH PROPOFOL  N/A 04/24/2022   Procedure: COLONOSCOPY WITH PROPOFOL ;  Surgeon: Rollin Dover, MD;  Location: [facility]OSCOPY;  Service: Gastroenterology;  Laterality: N/A;   HERNIA REPAIR     x 2   LEFT HEART CATH AND CORONARY ANGIOGRAPHY N/A 05/04/2018   Procedure: LEFT HEART CATH AND CORONARY  ANGIOGRAPHY;  Surgeon: Anner Alm ORN, MD;  Location: [facility] INVASIVE CV LAB;  Service: Cardiovascular;  Laterality: N/A;   MENISCUS REPAIR     2022 and 2023   NECK SURGERY     NEPHRECTOMY Left 2010   for atrophy and reflux   POLYPECTOMY  04/24/2022   Procedure: POLYPECTOMY;  Surgeon: Rollin Dover, MD;  Location: [facility]OS[facility];  Service: Gastroenterology;;   PROSTATE BIOPSY     SHOULDER SURGERY Left     Family History  Problem Relation Age of Onset   Arthritis Mother    Heart disease Mother    Hypertension Mother    Kidney disease Mother    Colon cancer Father        dx in his early 61's   Lung cancer Father    Prostate cancer Father    Breast cancer Father        71 or 1995   Lung cancer Brother        smoker   Lung cancer Brother        smoker   Bladder Cancer Brother    Lung cancer Paternal Aunt    Colon cancer Paternal Uncle    Diabetes Paternal Uncle    Arthritis Maternal Grandmother    Colon cancer Maternal Grandmother    Heart disease Maternal Grandmother    Heart disease Maternal Grandfather    Diabetes Paternal Grandfather     Allergies  Allergen Reactions   Nuvigil [Armodafinil] Other (See Comments)    Seizures    Seroquel [Quetiapine] Other (See Comments)    Seizures.   Simvastatin Hives    Current Outpatient Medications on File Prior to Visit  Medication Sig Dispense Refill   docusate sodium  (COLACE) 100 MG capsule Take 1 capsule (100 mg total) by mouth 2 (two) times daily. (Patient taking differently: Take 100 mg by mouth daily as needed for mild constipation or moderate constipation.) 60 capsule 2   methocarbamol  (ROBAXIN ) 500 MG tablet Take 1 tablet (500 mg total) by mouth every 6 (six) hours as needed for muscle spasms. 120 tablet 0   olmesartan  (BENICAR ) 20 MG tablet TAKE 1 TABLET (20 MG TOTAL) BY MOUTH DAILY. FOR BLOOD PRESSURE. 30 tablet 0   omeprazole  (PRILOSEC) 20 MG capsule Take 1 capsule (20 mg total) by mouth daily. 60 capsule 3    spironolactone  (ALDACTONE ) 25 MG tablet Take 1 tablet (25 mg total) by mouth daily. for blood pressure. 90 tablet 3   furosemide  (LASIX ) 20 MG tablet Take 1 tablet (20mg ) AS NEEDED for wt gain of 3lbs overnight or 5 lbs in one week. (Patient not taking: Reported on 12/23/2023) 30 tablet 0   HYDROcodone -acetaminophen  (NORCO/VICODIN) 5-325 MG tablet Take 1 tablet by mouth every 4 (four) hours as needed for moderate pain (pain score 4-6). (Patient not taking: Reported on 12/23/2023) 20  tablet 0   ondansetron  (ZOFRAN ) 4 MG tablet Take 1 tablet (4 mg total) by mouth every 6 (six) hours as needed for nausea. (Patient not taking: Reported on 12/23/2023) 20 tablet 0   polyethylene glycol (MIRALAX ) 17 g packet Take 17 g by mouth daily. (Patient not taking: Reported on 12/23/2023) 14 each 0   No current facility-administered medications on file prior to visit.    BP 118/62   Pulse 89   Temp (!) 97.2 F (36.2 C) (Temporal)   Ht 5' 10 (1.778 m)   Wt 298 lb (135.2 kg)   SpO2 96%   BMI 42.76 kg/m  Objective:   Physical Exam HENT:     Right Ear: Tympanic membrane and ear canal normal.     Left Ear: Tympanic membrane and ear canal normal.  Eyes:     Pupils: Pupils are equal, round, and reactive to light.  Cardiovascular:     Rate and Rhythm: Normal rate and regular rhythm.  Pulmonary:     Effort: Pulmonary effort is normal.     Breath sounds: Normal breath sounds.  Abdominal:     General: Bowel sounds are normal.     Palpations: Abdomen is soft.     Tenderness: There is no abdominal tenderness.  Musculoskeletal:        General: Normal range of motion.     Cervical back: Neck supple.  Skin:    General: Skin is warm and dry.  Neurological:     Mental Status: He is alert and oriented to person, place, and time.     Cranial Nerves: No cranial nerve deficit.     Deep Tendon Reflexes:     Reflex Scores:      Patellar reflexes are 2+ on the right side and 2+ on the left side. Psychiatric:        Mood  and Affect: Mood normal.           Assessment & Plan:  Preventative health care Assessment & Plan: Declines Shingrix, pneumonia, and influenza vaccines. Colonoscopy UTD, due 2026 PSA due and pending.  Discussed the importance of a healthy diet and regular exercise in order for weight loss, and to reduce the risk of further co-morbidity.  Exam stable. Labs pending.  Follow up in 1 year for repeat physical.    Unstable angina [facility]) Assessment & Plan: Following with cardiology, office notes reviewed from June 2024. Echocardiogram and coronary CT scan reviewed from July 2024.  Continue blood pressure control and lipid control.  Orders: -     Lipid panel  Essential hypertension Assessment & Plan: Controlled.  Continue olmesartan  20 mg daily and spironolactone  25 mg daily. BMP reviewed from November 2024.   Chest pain, moderate coronary artery risk Assessment & Plan: Reviewed cardiology notes from June 2024 and coronary CT scan from July 2024.     Gastroesophageal reflux disease, unspecified whether esophagitis present Assessment & Plan: Controlled.  Continue omeprazole  20 mg daily.    Type 2 diabetes mellitus with hyperglycemia, without long-term current use of insulin  (HCC) Assessment & Plan: Repeat A1C pending.  Remain off treatment.  Follow up dependant on A1C result.  Orders: -     Microalbumin / creatinine urine ratio -     Hemoglobin A1c  Acute embolism and thrombosis of superficial vein of right upper extremity Assessment & Plan: Improving.  Continue Ibuprofen  PRN. No complications on exam.   Cervical radiculopathy Assessment & Plan: Improving slowly. S/P ACDF C5-7 per neurosurgery.  Reviewed hospital notes from December 2024.    Hyperlipidemia, unspecified hyperlipidemia type Assessment & Plan: Repeat lipid panel pending.  Statin intolerant. Reviewed cardiology notes from June 2024 which discusses options for Zetia and PCSK9  treatment. He is not interested at this time, but will discuss with cardiology in the future.  Reviewed coronary CT scan from July 2024.   Screening for prostate cancer -     PSA  Encounter for hepatitis C screening test for low risk patient -     Hepatitis C antibody  Generalized abdominal pain Assessment & Plan: Hospitalization in August 2024. Improving with dietary changes and methods to keep bowels moving.  Reviewed hospital notes and imaging.         Caleen Taaffe K Raffael Bugarin, NP

## 2023-12-23 NOTE — Assessment & Plan Note (Signed)
 Repeat A1C pending.  Remain off treatment.  Follow up dependant on A1C result.

## 2023-12-23 NOTE — Assessment & Plan Note (Signed)
 Declines Shingrix, pneumonia, and influenza vaccines. Colonoscopy UTD, due 2026 PSA due and pending.  Discussed the importance of a healthy diet and regular exercise in order for weight loss, and to reduce the risk of further co-morbidity.  Exam stable. Labs pending.  Follow up in 1 year for repeat physical.

## 2023-12-23 NOTE — Assessment & Plan Note (Signed)
 Controlled.  Continue olmesartan 20 mg daily and spironolactone 25 mg daily. BMP reviewed from November 2024.

## 2023-12-23 NOTE — Assessment & Plan Note (Signed)
 Improving slowly. S/P ACDF C5-7 per neurosurgery.  Reviewed hospital notes from December 2024.

## 2023-12-23 NOTE — Assessment & Plan Note (Signed)
 Repeat lipid panel pending.  Statin intolerant. Reviewed cardiology notes from June 2024 which discusses options for Zetia and PCSK9 treatment. He is not interested at this time, but will discuss with cardiology in the future.  Reviewed coronary CT scan from July 2024.

## 2023-12-23 NOTE — Assessment & Plan Note (Signed)
 Improving.  Continue Ibuprofen PRN. No complications on exam.

## 2023-12-23 NOTE — Assessment & Plan Note (Signed)
 Reviewed cardiology notes from June 2024 and coronary CT scan from July 2024.

## 2023-12-23 NOTE — Assessment & Plan Note (Signed)
 Following with cardiology, office notes reviewed from June 2024. Echocardiogram and coronary CT scan reviewed from July 2024.  Continue blood pressure control and lipid control.

## 2023-12-23 NOTE — Assessment & Plan Note (Signed)
 Hospitalization in August 2024. Improving with dietary changes and methods to keep bowels moving.  Reviewed hospital notes and imaging.

## 2023-12-23 NOTE — Patient Instructions (Signed)
 Stop by the lab prior to leaving today. I will notify you of your results once received.   Think about Ozempic for diabetes and obesity treatment.  Please schedule a follow up visit for 6 months for a diabetes check.  It was a pleasure to see you today!

## 2023-12-23 NOTE — Assessment & Plan Note (Signed)
Controlled.   Continue omeprazole 20 mg daily. 

## 2023-12-24 LAB — HEPATITIS C ANTIBODY: Hepatitis C Ab: NONREACTIVE

## 2024-01-01 ENCOUNTER — Other Ambulatory Visit: Payer: Self-pay | Admitting: Primary Care

## 2024-01-01 DIAGNOSIS — I10 Essential (primary) hypertension: Secondary | ICD-10-CM

## 2024-01-09 ENCOUNTER — Other Ambulatory Visit: Payer: Self-pay | Admitting: Primary Care

## 2024-01-09 DIAGNOSIS — I10 Essential (primary) hypertension: Secondary | ICD-10-CM

## 2024-01-28 DIAGNOSIS — M5412 Radiculopathy, cervical region: Secondary | ICD-10-CM | POA: Diagnosis not present

## 2024-02-11 ENCOUNTER — Other Ambulatory Visit: Payer: Self-pay | Admitting: Surgery

## 2024-02-11 DIAGNOSIS — M5412 Radiculopathy, cervical region: Secondary | ICD-10-CM

## 2024-02-14 DIAGNOSIS — Z6841 Body Mass Index (BMI) 40.0 and over, adult: Secondary | ICD-10-CM | POA: Diagnosis not present

## 2024-02-14 DIAGNOSIS — R030 Elevated blood-pressure reading, without diagnosis of hypertension: Secondary | ICD-10-CM | POA: Diagnosis not present

## 2024-02-14 DIAGNOSIS — J22 Unspecified acute lower respiratory infection: Secondary | ICD-10-CM | POA: Diagnosis not present

## 2024-02-14 DIAGNOSIS — R059 Cough, unspecified: Secondary | ICD-10-CM | POA: Diagnosis not present

## 2024-03-03 ENCOUNTER — Other Ambulatory Visit

## 2024-04-24 DIAGNOSIS — G4733 Obstructive sleep apnea (adult) (pediatric): Secondary | ICD-10-CM | POA: Diagnosis not present

## 2024-06-23 ENCOUNTER — Ambulatory Visit: Payer: Self-pay | Admitting: Primary Care

## 2024-06-23 ENCOUNTER — Ambulatory Visit: Payer: BLUE CROSS/BLUE SHIELD | Admitting: Primary Care

## 2024-06-23 ENCOUNTER — Encounter: Payer: Self-pay | Admitting: Primary Care

## 2024-06-23 VITALS — BP 112/74 | HR 68 | Temp 97.4°F | Ht 70.0 in | Wt 291.0 lb

## 2024-06-23 DIAGNOSIS — E1165 Type 2 diabetes mellitus with hyperglycemia: Secondary | ICD-10-CM

## 2024-06-23 LAB — POCT GLYCOSYLATED HEMOGLOBIN (HGB A1C): Hemoglobin A1C: 6.6 % — AB (ref 4.0–5.6)

## 2024-06-23 LAB — MICROALBUMIN / CREATININE URINE RATIO
Creatinine,U: 82.8 mg/dL
Microalb Creat Ratio: 8.8 mg/g (ref 0.0–30.0)
Microalb, Ur: 0.7 mg/dL (ref 0.0–1.9)

## 2024-06-23 NOTE — Assessment & Plan Note (Signed)
 Improved with A1C today of 6.6!  Commended him on dietary changes. Continue to work on this. Urine microalbumin due and pending.  Continue off medication.   Follow up in 6 months.

## 2024-06-23 NOTE — Progress Notes (Signed)
 Subjective:    Patient ID: Evan [name], male    DOB: 02/14/1966, 58 y.o.   MRN: [id number]  HPI  Evan [name] is a very pleasant 58 y.o. male with a history of type 2 diabetes, hypertension, hyperlipidemia, obesity who presents today for follow-up of diabetes.  Current medications include: None  He is checking his blood glucose 0 times daily.  Last A1C: 7.7 in January 2025, 6.6 today Last Eye Exam: Due, scheduled for next week.  Last Foot Exam: Due Pneumonia Vaccination: Never completed, declines Urine Microalbumin: Due Statin: None  Dietary changes since last visit: He's been working to change his diet by eating more veggies, lean protein. Limiting carbs.    Exercise: None  BP Readings from Last 3 Encounters:  06/23/24 112/74  12/23/23 118/62  12/14/23 122/76   Wt Readings from Last 3 Encounters:  06/23/24 291 lb (132 kg)  12/23/23 298 lb (135.2 kg)  12/14/23 294 lb 9.6 oz (133.6 kg)       Review of Systems  Eyes:  Positive for visual disturbance.  Respiratory:  Negative for shortness of breath.   Cardiovascular:  Negative for chest pain.  Neurological:  Positive for numbness.         Past Medical History:  Diagnosis Date   Arthritis    Chronic cough 01/05/2020   Chronic kidney disease    Colon polyps    benign per pt   Diverticulitis    Elevated CK 06/14/2020   GERD (gastroesophageal reflux disease)    Headache    Hyperlipidemia    Hypertension    Intractable abdominal pain 07/27/2023   Lyme disease    Myalgia 11/04/2018   New daily persistent headache 12/31/2021   Nocturia 02/14/2021   Seizures (HCC)    Sleep apnea    CPAP   Weak urinary stream 12/31/2021    Social History   Socioeconomic History   Marital status: Married    Spouse name: Not on file   Number of children: 4   Years of education: Not on file   Highest education level: Not on file  Occupational History   Occupation: Production designer, theatre/television/film  Tobacco Use   Smoking status: Never    Smokeless tobacco: Never  Vaping Use   Vaping status: Never Used  Substance and Sexual Activity   Alcohol use: Not Currently    Comment: occasional   Drug use: No   Sexual activity: Not on file  Other Topics Concern   Not on file  Social History Narrative   Not on file   Social Drivers of Health   Financial Resource Strain: Not on file  Food Insecurity: No Food Insecurity (07/27/2023)   Hunger Vital Sign    Worried About Running Out of Food in the Last Year: Never true    Ran Out of Food in the Last Year: Never true  Transportation Needs: No Transportation Needs (07/27/2023)   PRAPARE - Administrator, Civil Service (Medical): No    Lack of Transportation (Non-Medical): No  Physical Activity: Not on file  Stress: Not on file  Social Connections: Unknown (04/28/2022)   Received from [facility]   Social Network    Social Network: Not on file  Intimate Partner Violence: Not At Risk (07/27/2023)   Humiliation, Afraid, Rape, and Kick questionnaire    Fear of Current or Ex-Partner: No    Emotionally Abused: No    Physically Abused: No    Sexually Abused: No    Past  Surgical History:  Procedure Laterality Date   ANTERIOR CERVICAL DECOMP/DISCECTOMY FUSION N/A 11/18/2023   Procedure: Anterior Cervical Discectomy/Decompression Fusion Cervical Five-Six, Cervical Six-Seven;  Surgeon: Dawley, Lani BROCKS, DO;  Location: [facility] OR;  Service: Neurosurgery;  Laterality: N/A;  3C   CARDIAC CATHETERIZATION     CHOLECYSTECTOMY  2021   COLONOSCOPY WITH PROPOFOL  N/A 04/24/2022   Procedure: COLONOSCOPY WITH PROPOFOL ;  Surgeon: Rollin Dover, MD;  Location: [facility];  Service: Gastroenterology;  Laterality: N/A;   HERNIA REPAIR     x 2   LEFT HEART CATH AND CORONARY ANGIOGRAPHY N/A 05/04/2018   Procedure: LEFT HEART CATH AND CORONARY ANGIOGRAPHY;  Surgeon: Anner Alm ORN, MD;  Location: [facility] INVASIVE CV LAB;  Service: Cardiovascular;  Laterality: N/A;   MENISCUS REPAIR     2022 and  2023   NECK SURGERY     NEPHRECTOMY Left 2010   for atrophy and reflux   POLYPECTOMY  04/24/2022   Procedure: POLYPECTOMY;  Surgeon: Rollin Dover, MD;  Location: [facility];  Service: Gastroenterology;;   PROSTATE BIOPSY     SHOULDER SURGERY Left     Family History  Problem Relation Age of Onset   Arthritis Mother    Heart disease Mother    Hypertension Mother    Kidney disease Mother    Colon cancer Father        dx in his early 6's   Lung cancer Father    Prostate cancer Father    Breast cancer Father        18 or 1995   Lung cancer Brother        smoker   Lung cancer Brother        smoker   Bladder Cancer Brother    Lung cancer Paternal Aunt    Colon cancer Paternal Uncle    Diabetes Paternal Uncle    Arthritis Maternal Grandmother    Colon cancer Maternal Grandmother    Heart disease Maternal Grandmother    Heart disease Maternal Grandfather    Diabetes Paternal Grandfather     Allergies  Allergen Reactions   Nuvigil [Armodafinil] Other (See Comments)    Seizures    Seroquel [Quetiapine] Other (See Comments)    Seizures.   Simvastatin Hives    Current Outpatient Medications on File Prior to Visit  Medication Sig Dispense Refill   olmesartan  (BENICAR ) 20 MG tablet TAKE 1 TABLET (20 MG TOTAL) BY MOUTH DAILY. FOR BLOOD PRESSURE. 90 tablet 3   omeprazole  (PRILOSEC) 20 MG capsule Take 1 capsule (20 mg total) by mouth daily. 60 capsule 3   spironolactone  (ALDACTONE ) 25 MG tablet TAKE 1 TABLET BY MOUTH EVERY DAY FOR BLOOD PRESSURE 90 tablet 2   furosemide  (LASIX ) 20 MG tablet Take 1 tablet (20mg ) AS NEEDED for wt gain of 3lbs overnight or 5 lbs in one week. (Patient not taking: Reported on 06/23/2024) 30 tablet 0   No current facility-administered medications on file prior to visit.    BP 112/74   Pulse 68   Temp (!) 97.4 F (36.3 C) (Temporal)   Ht 5' 10 (1.778 m)   Wt 291 lb (132 kg)   SpO2 94%   BMI 41.75 kg/m  Objective:   Physical  Exam Cardiovascular:     Rate and Rhythm: Normal rate and regular rhythm.  Pulmonary:     Effort: Pulmonary effort is normal.     Breath sounds: Normal breath sounds.  Musculoskeletal:     Cervical back: Neck supple.  Skin:    General: Skin is warm and dry.  Neurological:     Mental Status: He is alert and oriented to person, place, and time.  Psychiatric:        Mood and Affect: Mood normal.           Assessment & Plan:  Type 2 diabetes mellitus with hyperglycemia, without long-term current use of insulin  (HCC) Assessment & Plan: Improved with A1C today of 6.6!  Commended him on dietary changes. Continue to work on this. Urine microalbumin due and pending.  Continue off medication.   Follow up in 6 months.   Orders: -     POCT glycosylated hemoglobin (Hb A1C) -     Microalbumin / creatinine urine ratio        Comer MARLA Gaskins, NP

## 2024-06-23 NOTE — Patient Instructions (Signed)
 Stop by the lab prior to leaving today. I will notify you of your results once received.   Please schedule a physical to meet with me in 6 months.   It was a pleasure to see you today!

## 2024-06-30 DIAGNOSIS — H2513 Age-related nuclear cataract, bilateral: Secondary | ICD-10-CM | POA: Diagnosis not present

## 2024-06-30 DIAGNOSIS — H532 Diplopia: Secondary | ICD-10-CM | POA: Diagnosis not present

## 2024-06-30 DIAGNOSIS — E119 Type 2 diabetes mellitus without complications: Secondary | ICD-10-CM | POA: Diagnosis not present

## 2024-06-30 DIAGNOSIS — H40003 Preglaucoma, unspecified, bilateral: Secondary | ICD-10-CM | POA: Diagnosis not present

## 2024-06-30 LAB — HM DIABETES EYE EXAM

## 2024-07-03 ENCOUNTER — Encounter: Payer: Self-pay | Admitting: Primary Care

## 2024-10-07 ENCOUNTER — Other Ambulatory Visit: Payer: Self-pay | Admitting: Primary Care

## 2024-10-07 DIAGNOSIS — I10 Essential (primary) hypertension: Secondary | ICD-10-CM

## 2024-10-31 DIAGNOSIS — L918 Other hypertrophic disorders of the skin: Secondary | ICD-10-CM | POA: Diagnosis not present

## 2024-10-31 DIAGNOSIS — L820 Inflamed seborrheic keratosis: Secondary | ICD-10-CM | POA: Diagnosis not present

## 2024-10-31 DIAGNOSIS — L821 Other seborrheic keratosis: Secondary | ICD-10-CM | POA: Diagnosis not present

## 2024-10-31 DIAGNOSIS — C44319 Basal cell carcinoma of skin of other parts of face: Secondary | ICD-10-CM | POA: Diagnosis not present

## 2024-10-31 DIAGNOSIS — Z85828 Personal history of other malignant neoplasm of skin: Secondary | ICD-10-CM | POA: Diagnosis not present

## 2024-10-31 DIAGNOSIS — L570 Actinic keratosis: Secondary | ICD-10-CM | POA: Diagnosis not present

## 2024-10-31 DIAGNOSIS — L814 Other melanin hyperpigmentation: Secondary | ICD-10-CM | POA: Diagnosis not present

## 2024-11-15 ENCOUNTER — Other Ambulatory Visit: Payer: Self-pay | Admitting: Surgery

## 2024-11-21 ENCOUNTER — Ambulatory Visit: Payer: Self-pay

## 2024-11-21 NOTE — Telephone Encounter (Signed)
 FYI Only or Action Required?: Action required by provider: request for appointment.  Patient was last seen in primary care on 06/23/2024 by Gretta Comer POUR, NP.  Called Nurse Triage reporting Back Pain.  Symptoms began several days ago.  Interventions attempted: OTC medications:  SABRA  Symptoms are: gradually worsening.Pain to back under right shoulder blade. OTC not helping  Triage Disposition: See PCP When Office is Open (Within 3 Days)  Patient/caregiver understands and will follow disposition?: Yes      Copied from CRM #[id number]. Topic: Clinical - Red Word Triage >> Nov 21, 2024 10:19 AM Harlene ORN wrote: Red Word that prompted transfer to Nurse Triage: pain below right shoulder blade for 4-5 days. 8 out of 10 pain with a burning sensation. Reason for Disposition  [1] MODERATE back pain (e.g., interferes with normal activities) AND [2] present > 3 days  Answer Assessment - Initial Assessment Questions 1. ONSET: When did the pain begin? (e.g., minutes, hours, days)     6 days  2. LOCATION: Where does it hurt? (upper, mid or lower back)     Below right shoulder 3. SEVERITY: How bad is the pain?  (e.g., Scale 1-10; mild, moderate, or severe)     8 4. PATTERN: Is the pain constant? (e.g., yes, no; constant, intermittent)      Constant 5. RADIATION: Does the pain shoot into your legs or somewhere else?     no 6. CAUSE:  What do you think is causing the back pain?      unsure 7. BACK OVERUSE:  Any recent lifting of heavy objects, strenuous work or exercise?     no 8. MEDICINES: What have you taken so far for the pain? (e.g., nothing, acetaminophen , NSAIDS)     OTC 9. NEUROLOGIC SYMPTOMS: Do you have any weakness, numbness, or problems with bowel/bladder control?     NO 10. OTHER SYMPTOMS: Do you have any other symptoms? (e.g., fever, abdomen pain, burning with urination, blood in urine)       NO 11. PREGNANCY: Is there any chance you are pregnant? When  was your last menstrual period?       N/A  Protocols used: Back Pain-A-AH

## 2024-11-21 NOTE — Telephone Encounter (Signed)
 Noted, will evaluate.

## 2024-11-22 ENCOUNTER — Ambulatory Visit: Payer: Self-pay | Admitting: Primary Care

## 2024-11-22 ENCOUNTER — Ambulatory Visit
Admission: RE | Admit: 2024-11-22 | Discharge: 2024-11-22 | Disposition: A | Discharge status: Home / Self Care | Source: Ambulatory Visit | Attending: Primary Care | Admitting: Primary Care

## 2024-11-22 ENCOUNTER — Encounter: Payer: Self-pay | Admitting: Primary Care

## 2024-11-22 ENCOUNTER — Ambulatory Visit: Admitting: Primary Care

## 2024-11-22 VITALS — BP 110/64 | HR 71 | Temp 98.6°F | Ht 70.0 in | Wt 293.0 lb

## 2024-11-22 DIAGNOSIS — M546 Pain in thoracic spine: Secondary | ICD-10-CM | POA: Insufficient documentation

## 2024-11-22 DIAGNOSIS — R1084 Generalized abdominal pain: Secondary | ICD-10-CM | POA: Diagnosis not present

## 2024-11-22 DIAGNOSIS — R109 Unspecified abdominal pain: Secondary | ICD-10-CM | POA: Diagnosis not present

## 2024-11-22 DIAGNOSIS — G8929 Other chronic pain: Secondary | ICD-10-CM | POA: Diagnosis present

## 2024-11-22 DIAGNOSIS — K5730 Diverticulosis of large intestine without perforation or abscess without bleeding: Secondary | ICD-10-CM | POA: Diagnosis not present

## 2024-11-22 DIAGNOSIS — Z905 Acquired absence of kidney: Secondary | ICD-10-CM | POA: Diagnosis not present

## 2024-11-22 LAB — CBC WITH DIFFERENTIAL/PLATELET
Basophils Absolute: 0.1 K/uL (ref 0.0–0.1)
Basophils Relative: 1.5 % (ref 0.0–3.0)
Eosinophils Absolute: 0.3 K/uL (ref 0.0–0.7)
Eosinophils Relative: 3.6 % (ref 0.0–5.0)
HCT: 45.1 % (ref 39.0–52.0)
Hemoglobin: 16 g/dL (ref 13.0–17.0)
Lymphocytes Relative: 28 % (ref 12.0–46.0)
Lymphs Abs: 2.1 K/uL (ref 0.7–4.0)
MCHC: 35.4 g/dL (ref 30.0–36.0)
MCV: 90 fl (ref 78.0–100.0)
Monocytes Absolute: 0.7 K/uL (ref 0.1–1.0)
Monocytes Relative: 8.9 % (ref 3.0–12.0)
Neutro Abs: 4.3 K/uL (ref 1.4–7.7)
Neutrophils Relative %: 58 % (ref 43.0–77.0)
Platelets: 239 K/uL (ref 150.0–400.0)
RBC: 5.01 Mil/uL (ref 4.22–5.81)
RDW: 13.4 % (ref 11.5–15.5)
WBC: 7.4 K/uL (ref 4.0–10.5)

## 2024-11-22 LAB — COMPREHENSIVE METABOLIC PANEL WITH GFR
ALT: 27 U/L (ref 0–53)
AST: 21 U/L (ref 0–37)
Albumin: 4.3 g/dL (ref 3.5–5.2)
Alkaline Phosphatase: 45 U/L (ref 39–117)
BUN: 16 mg/dL (ref 6–23)
CO2: 29 meq/L (ref 19–32)
Calcium: 9.6 mg/dL (ref 8.4–10.5)
Chloride: 101 meq/L (ref 96–112)
Creatinine, Ser: 0.98 mg/dL (ref 0.40–1.50)
GFR: 83.88 mL/min (ref >60.00)
Glucose, Bld: 159 mg/dL — ABNORMAL HIGH (ref 70–99)
Potassium: 4.2 meq/L (ref 3.5–5.1)
Sodium: 137 meq/L (ref 135–145)
Total Bilirubin: 0.5 mg/dL (ref 0.2–1.2)
Total Protein: 7.1 g/dL (ref 6.0–8.3)

## 2024-11-22 LAB — POCT I-STAT CREATININE: Creatinine, Ser: 1 mg/dL (ref 0.61–1.24)

## 2024-11-22 LAB — LIPASE: Lipase: 18 U/L (ref 11.0–59.0)

## 2024-11-22 MED ORDER — IOHEXOL 300 MG/ML  SOLN
100.0000 mL | Freq: Once | INTRAMUSCULAR | Status: AC | PRN
  Administered 2024-11-22: 100 mL via INTRAVENOUS

## 2024-11-22 MED ORDER — METHOCARBAMOL 500 MG PO TABS: 500.0000 mg | ORAL_TABLET | Freq: Three times a day (TID) | ORAL | 0 refills | Status: AC | PRN

## 2024-11-22 NOTE — Progress Notes (Signed)
 Subjective:    Patient ID: Evan [name], male    DOB: 07/11/1966, 58 y.o.   MRN: [id number]  [name] is a very pleasant 58 y.o. male with a history of hypertension, unstable angina, cholecystectomy, type 2 diabetes, OSA, obesity, chronic knee pain, chronic fatigue who presents today to discuss back pain and chronic abdominal pain.  He contacted our office yesterday with a 4-5 day history of right thoracic back pain without improvement despite OTC treatment.  He was asked to come in for evaluation.  Today he discusses ongoing back pain which is now constant, waxes and wanes in intensity, describes as a burning sensation. His pain is radiating around to the right lateral side. His pain is worse when laying on his right side, leaning back in a recliner. He denies injury/trauma, rashes. He's been taking Excedrin with temporary improvement.   He's also noticed increased generalized abdominal discomfort. He has chronic abdominal pain, my abdomen always hurts'. His bowel movements have increased to 2-3 times daily, non diarrhea. He has chronic nausea, no worse than usual, typically in the morning. He will have a bowel movement and then feel his abdomen bloating which is chronic. Something doesn't feel right.  His urine has been more foamy than usual.  He underwent CT abdomen/pelvis in August 2024 which showed unchanged, chronic, mild fat stranding surrounding the distal jejunal mesenteric vessels.   Review of Systems  Gastrointestinal:  Positive for abdominal pain and nausea. Negative for constipation and diarrhea.  Musculoskeletal:  Positive for myalgias.  Skin:  Negative for color change and rash.         Past Medical History:  Diagnosis Date   Allergy 2010   seasonal   Arthritis    Chronic cough 01/05/2020   Chronic kidney disease    Colon polyps    benign per pt   Diverticulitis    Elevated CK 06/14/2020   GERD (gastroesophageal reflux disease)    Headache     Hyperlipidemia    Hypertension    Intractable abdominal pain 07/27/2023   Lyme disease    Myalgia 11/04/2018   Neuromuscular disorder (HCC) 2014   lyme   New daily persistent headache 12/31/2021   Nocturia 02/14/2021   Seizures (HCC)    Sleep apnea    CPAP   Weak urinary stream 12/31/2021    Social History   Socioeconomic History   Marital status: Married    Spouse name: Not on file   Number of children: 4   Years of education: Not on file   Highest education level: Not on file  Occupational History   Occupation: production designer, theatre/television/film  Tobacco Use   Smoking status: Never   Smokeless tobacco: Never  Vaping Use   Vaping status: Never Used  Substance and Sexual Activity   Alcohol use: Not Currently    Comment: occasional   Drug use: No   Sexual activity: Not on file  Other Topics Concern   Not on file  Social History Narrative   Not on file   Social Drivers of Health   Financial Resource Strain: Not on file  Food Insecurity: No Food Insecurity (07/27/2023)   Hunger Vital Sign    Worried About Running Out of Food in the Last Year: Never true    Ran Out of Food in the Last Year: Never true  Transportation Needs: No Transportation Needs (07/27/2023)   PRAPARE - Administrator, Civil Service (Medical): No    Lack of Transportation (Non-Medical):  No  Physical Activity: Not on file  Stress: Not on file  Social Connections: Unknown (04/28/2022)   Received from [facility]   Social Network    Social Network: Not on file  Intimate Partner Violence: Not At Risk (07/27/2023)   Humiliation, Afraid, Rape, and Kick questionnaire    Fear of Current or Ex-Partner: No    Emotionally Abused: No    Physically Abused: No    Sexually Abused: No    Past Surgical History:  Procedure Laterality Date   ANTERIOR CERVICAL DECOMP/DISCECTOMY FUSION N/A 11/18/2023   Procedure: Anterior Cervical Discectomy/Decompression Fusion Cervical Five-Six, Cervical Six-Seven;  Surgeon: Dawley, Lani BROCKS, DO;  Location: [facility] OR;  Service: Neurosurgery;  Laterality: N/A;  3C   CARDIAC CATHETERIZATION     CHOLECYSTECTOMY  2021   COLONOSCOPY WITH PROPOFOL  N/A 04/24/2022   Procedure: COLONOSCOPY WITH PROPOFOL ;  Surgeon: Rollin Dover, MD;  Location: [facility];  Service: Gastroenterology;  Laterality: N/A;   HERNIA REPAIR     x 2   LEFT HEART CATH AND CORONARY ANGIOGRAPHY N/A 05/04/2018   Procedure: LEFT HEART CATH AND CORONARY ANGIOGRAPHY;  Surgeon: Anner Alm ORN, MD;  Location: [facility] INVASIVE CV LAB;  Service: Cardiovascular;  Laterality: N/A;   MENISCUS REPAIR     2022 and 2023   NECK SURGERY     NEPHRECTOMY Left 2010   for atrophy and reflux   POLYPECTOMY  04/24/2022   Procedure: POLYPECTOMY;  Surgeon: Rollin Dover, MD;  Location: [facility];  Service: Gastroenterology;;   PROSTATE BIOPSY     SHOULDER SURGERY Left    SPINE SURGERY  2012   c4 c5    Family History  Problem Relation Age of Onset   Arthritis Mother    Heart disease Mother    Hypertension Mother    Kidney disease Mother    Colon cancer Father        dx in his early 79's   Lung cancer Father    Prostate cancer Father    Breast cancer Father        108 or 76   Lung cancer Brother        smoker   Lung cancer Brother        smoker   Bladder Cancer Brother    Lung cancer Paternal Aunt    Colon cancer Paternal Uncle    Diabetes Paternal Uncle    Arthritis Maternal Grandmother    Colon cancer Maternal Grandmother    Heart disease Maternal Grandmother    Heart disease Maternal Grandfather    Diabetes Paternal Grandfather     Allergies  Allergen Reactions   Nuvigil [Armodafinil] Other (See Comments)    Seizures    Seroquel [Quetiapine] Other (See Comments)    Seizures.   Simvastatin Hives    Current Outpatient Medications on File Prior to Visit  Medication Sig Dispense Refill   olmesartan  (BENICAR ) 20 MG tablet TAKE 1 TABLET (20 MG TOTAL) BY MOUTH DAILY. FOR BLOOD PRESSURE. 90 tablet 3    omeprazole  (PRILOSEC) 20 MG capsule Take 1 capsule (20 mg total) by mouth daily. 60 capsule 3   spironolactone  (ALDACTONE ) 25 MG tablet TAKE 1 TABLET BY MOUTH EVERY DAY FOR BLOOD PRESSURE 90 tablet 0   furosemide  (LASIX ) 20 MG tablet Take 1 tablet (20mg ) AS NEEDED for wt gain of 3lbs overnight or 5 lbs in one week. (Patient not taking: Reported on 11/22/2024) 30 tablet 0   No current facility-administered medications on file prior  to visit.    BP 110/64   Pulse 71   Temp 98.6 F (37 C) (Oral)   Ht 5' 10 (1.778 m)   Wt 293 lb (132.9 kg)   SpO2 95%   BMI 42.04 kg/m  Objective:   Physical Exam Cardiovascular:     Rate and Rhythm: Normal rate and regular rhythm.  Pulmonary:     Effort: Pulmonary effort is normal.  Abdominal:     Tenderness: There is generalized abdominal tenderness. There is no guarding.  Musculoskeletal:     Cervical back: Neck supple.     Thoracic back: No spasms, tenderness or bony tenderness. Decreased range of motion.       Back:  Skin:    General: Skin is warm and dry.  Neurological:     Mental Status: He is alert and oriented to person, place, and time.  Psychiatric:        Mood and Affect: Mood normal.     Physical Exam        Assessment & Plan:  Acute right-sided thoracic back pain Assessment & Plan: Differentials include MSK, especially since he can provoke symptoms, renal stone, GI related. No evidence of shingles  Stat CT abdomen/pelvis ordered and pending.  Labs pending today including CBC, CMP, Lipase.  Rx for methocarbamol  500 mg TID ordered.  Drowsiness precautions provided  Await results  Orders: -     Methocarbamol ; Take 1 tablet (500 mg total) by mouth every 8 (eight) hours as needed for muscle spasms.  Dispense: 15 tablet; Refill: 0  Acute exacerbation of chronic abdominal pain Assessment & Plan: Differentials include renal stones, irritable bowel syndrome, other GI causes, mesenteric vessel causes.  Reviewed CT abdomen  pelvis from August 2024.  Based on those results, coupled with examination and HPI today, we will proceed with CT abdomen pelvis.  Orders placed. CMP and CBC pending.  Orders: -     CT ABDOMEN PELVIS W CONTRAST; Future -     CBC with Differential/Platelet -     Comprehensive metabolic panel with GFR -     Lipase    Assessment and Plan Assessment & Plan         Comer MARLA Gaskins, NP

## 2024-11-22 NOTE — Assessment & Plan Note (Signed)
 Differentials include renal stones, irritable bowel syndrome, other GI causes, mesenteric vessel causes.  Reviewed CT abdomen pelvis from August 2024.  Based on those results, coupled with examination and HPI today, we will proceed with CT abdomen pelvis.  Orders placed. CMP and CBC pending.

## 2024-11-22 NOTE — Patient Instructions (Signed)
 Stop by the lab prior to leaving today. I will notify you of your results once received.   You may take the methocarbamol  muscle relaxer 3 times daily as needed for back pain.  This may cause drowsiness.  You will be contacted later today regarding your CT scan.  It was a pleasure to see you today!

## 2024-11-22 NOTE — Assessment & Plan Note (Signed)
 Differentials include MSK, especially since he can provoke symptoms, renal stone, GI related. No evidence of shingles  Stat CT abdomen/pelvis ordered and pending.  Labs pending today including CBC, CMP, Lipase.  Rx for methocarbamol  500 mg TID ordered.  Drowsiness precautions provided  Await results

## 2024-11-30 DIAGNOSIS — C44319 Basal cell carcinoma of skin of other parts of face: Secondary | ICD-10-CM | POA: Diagnosis not present

## 2024-12-10 ENCOUNTER — Other Ambulatory Visit: Payer: Self-pay | Admitting: Primary Care

## 2024-12-10 DIAGNOSIS — I10 Essential (primary) hypertension: Secondary | ICD-10-CM

## 2024-12-25 LAB — BASIC METABOLIC PANEL WITH GFR
Creatinine: 1.1 (ref 0.6–1.3)
Glucose: 144

## 2024-12-25 LAB — COMPREHENSIVE METABOLIC PANEL WITH GFR: eGFR: 80

## 2024-12-25 LAB — HEMOGLOBIN A1C: Hemoglobin A1C: 7.2

## 2024-12-25 LAB — LIPID PANEL
Cholesterol: 236 — AB (ref 0–200)
HDL: 43 (ref 35–70)
LDL Cholesterol: 166
Triglycerides: 134 (ref 40–160)

## 2024-12-29 ENCOUNTER — Encounter: Admitting: Primary Care

## 2025-01-04 ENCOUNTER — Encounter: Payer: Self-pay | Admitting: Primary Care

## 2025-01-04 ENCOUNTER — Ambulatory Visit: Admitting: Primary Care

## 2025-01-04 ENCOUNTER — Ambulatory Visit: Payer: Self-pay | Admitting: Primary Care

## 2025-01-04 VITALS — BP 118/68 | HR 78 | Temp 97.8°F | Ht 69.0 in | Wt 294.4 lb

## 2025-01-04 DIAGNOSIS — I10 Essential (primary) hypertension: Secondary | ICD-10-CM | POA: Diagnosis not present

## 2025-01-04 DIAGNOSIS — M546 Pain in thoracic spine: Secondary | ICD-10-CM

## 2025-01-04 DIAGNOSIS — G4733 Obstructive sleep apnea (adult) (pediatric): Secondary | ICD-10-CM | POA: Diagnosis not present

## 2025-01-04 DIAGNOSIS — Z1211 Encounter for screening for malignant neoplasm of colon: Secondary | ICD-10-CM

## 2025-01-04 DIAGNOSIS — Z125 Encounter for screening for malignant neoplasm of prostate: Secondary | ICD-10-CM | POA: Diagnosis not present

## 2025-01-04 DIAGNOSIS — E1165 Type 2 diabetes mellitus with hyperglycemia: Secondary | ICD-10-CM

## 2025-01-04 DIAGNOSIS — K219 Gastro-esophageal reflux disease without esophagitis: Secondary | ICD-10-CM | POA: Diagnosis not present

## 2025-01-04 DIAGNOSIS — Z0000 Encounter for general adult medical examination without abnormal findings: Secondary | ICD-10-CM | POA: Diagnosis not present

## 2025-01-04 DIAGNOSIS — E785 Hyperlipidemia, unspecified: Secondary | ICD-10-CM

## 2025-01-04 LAB — PSA: PSA: 0.52 ng/mL (ref 0.10–4.00)

## 2025-01-04 LAB — MICROALBUMIN / CREATININE URINE RATIO
Creatinine,U: 58.5 mg/dL
Microalb Creat Ratio: UNDETERMINED mg/g (ref 0.0–30.0)
Microalb, Ur: <0.7 mg/dL

## 2025-01-04 NOTE — Progress Notes (Signed)
 "  Subjective:    Patient ID: Evan [name], male    DOB: [birth year], 59 y.o.   MRN: [id number]  Evan [name] is a very pleasant 60 y.o. male who presents today for complete physical and follow up of chronic conditions.  He continues to experience thoracic back pain which radiates from the right side to left side. He's taking methocarbamol  without improvement. He does have intermittent numbness to bilateral upper extremities. History of cervical spine fusion in December 2024. He's using massage and heat.   Immunizations: -Tetanus: Completed in 2023 -Influenza: Declines influenza vaccine.  -Shingles: Never completed Shingrix series -Pneumonia: Never completed   Diet: Fair diet.  Exercise: No regular exercise.  Eye exam: Completes annually  Dental exam: Completes semi-annually    Colonoscopy: Completed in 2023, due May 2026  PSA: Due  BP Readings from Last 3 Encounters:  01/04/25 118/68  11/22/24 110/64  06/23/24 112/74    Wt Readings from Last 3 Encounters:  01/04/25 294 lb 6 oz (133.5 kg)  11/22/24 293 lb (132.9 kg)  06/23/24 291 lb (132 kg)        Review of Systems  Constitutional:  Negative for unexpected weight change.  HENT:  Negative for rhinorrhea.   Respiratory:  Negative for cough and shortness of breath.   Cardiovascular:  Negative for chest pain.  Gastrointestinal:  Negative for constipation and diarrhea.  Genitourinary:  Negative for difficulty urinating.  Musculoskeletal:  Positive for arthralgias and back pain.  Skin:  Negative for rash.  Allergic/Immunologic: Negative for environmental allergies.  Neurological:  Negative for dizziness and headaches.  Psychiatric/Behavioral:  The patient is not nervous/anxious.          Past Medical History:  Diagnosis Date   Allergy 2010   seasonal   Arthritis    Chronic cough 01/05/2020   Chronic kidney disease    Colon polyps    benign per pt   Diverticulitis    Elevated CK 06/14/2020   GERD  (gastroesophageal reflux disease)    Headache    Hyperlipidemia    Hypertension    Intractable abdominal pain 07/27/2023   Localized skin mass, lump, or swelling 09/18/2021   Lyme disease    Myalgia 11/04/2018   Neuromuscular disorder (HCC) 2014   lyme   New daily persistent headache 12/31/2021   Nocturia 02/14/2021   Seizures (HCC)    Sleep apnea    CPAP   Weak urinary stream 12/31/2021    Social History   Socioeconomic History   Marital status: Married    Spouse name: Not on file   Number of children: 4   Years of education: Not on file   Highest education level: Not on file  Occupational History   Occupation: production designer, theatre/television/film  Tobacco Use   Smoking status: Never   Smokeless tobacco: Never  Vaping Use   Vaping status: Never Used  Substance and Sexual Activity   Alcohol use: Not Currently    Comment: occasional   Drug use: No   Sexual activity: Not on file  Other Topics Concern   Not on file  Social History Narrative   Not on file   Social Drivers of Health   Tobacco Use: Low Risk (01/04/2025)   Patient History    Smoking Tobacco Use: Never    Smokeless Tobacco Use: Never    Passive Exposure: Not on file  Financial Resource Strain: Not on file  Food Insecurity: No Food Insecurity (07/27/2023)   Hunger Vital Sign  Worried About Programme Researcher, Broadcasting/film/video in the Last Year: Never true    Ran Out of Food in the Last Year: Never true  Transportation Needs: No Transportation Needs (07/27/2023)   PRAPARE - Administrator, Civil Service (Medical): No    Lack of Transportation (Non-Medical): No  Physical Activity: Not on file  Stress: Not on file  Social Connections: Unknown (04/28/2022)   Received from [facility]   Social Network    Social Network: Not on file  Intimate Partner Violence: Not At Risk (07/27/2023)   Humiliation, Afraid, Rape, and Kick questionnaire    Fear of Current or Ex-Partner: No    Emotionally Abused: No    Physically Abused: No     Sexually Abused: No  Depression (PHQ2-9): Low Risk (01/08/2023)   Depression (PHQ2-9)    PHQ-2 Score: 0  Alcohol Screen: Not on file  Housing: Low Risk (07/27/2023)   Housing    Last Housing Risk Score: 0  Utilities: Not At Risk (07/27/2023)   [employer] Utilities    Threatened with loss of utilities: No  Health Literacy: Not on file    Past Surgical History:  Procedure Laterality Date   ANTERIOR CERVICAL DECOMP/DISCECTOMY FUSION N/A 11/18/2023   Procedure: Anterior Cervical Discectomy/Decompression Fusion Cervical Five-Six, Cervical Six-Seven;  Surgeon: Dawley, Lani BROCKS, DO;  Location: [facility] OR;  Service: Neurosurgery;  Laterality: N/A;  3C   CARDIAC CATHETERIZATION     CHOLECYSTECTOMY  2021   COLONOSCOPY WITH PROPOFOL  N/A 04/24/2022   Procedure: COLONOSCOPY WITH PROPOFOL ;  Surgeon: Rollin Dover, MD;  Location: [facility];  Service: Gastroenterology;  Laterality: N/A;   HERNIA REPAIR     x 2   LEFT HEART CATH AND CORONARY ANGIOGRAPHY N/A 05/04/2018   Procedure: LEFT HEART CATH AND CORONARY ANGIOGRAPHY;  Surgeon: Anner Alm ORN, MD;  Location: [facility] INVASIVE CV LAB;  Service: Cardiovascular;  Laterality: N/A;   MENISCUS REPAIR     2022 and 2023   NECK SURGERY     NEPHRECTOMY Left 2010   for atrophy and reflux   POLYPECTOMY  04/24/2022   Procedure: POLYPECTOMY;  Surgeon: Rollin Dover, MD;  Location: [facility];  Service: Gastroenterology;;   PROSTATE BIOPSY     SHOULDER SURGERY Left    SPINE SURGERY  2012   c4 c5    Family History  Problem Relation Age of Onset   Arthritis Mother    Heart disease Mother    Hypertension Mother    Kidney disease Mother    Colon cancer Father        dx in his early 57's   Lung cancer Father    Prostate cancer Father    Breast cancer Father        21 or 85   Lung cancer Brother        smoker   Lung cancer Brother        smoker   Bladder Cancer Brother    Lung cancer Paternal Aunt    Colon cancer Paternal Uncle    Diabetes Paternal Uncle     Arthritis Maternal Grandmother    Colon cancer Maternal Grandmother    Heart disease Maternal Grandmother    Heart disease Maternal Grandfather    Diabetes Paternal Grandfather     Allergies[1]  Medications Ordered Prior to Encounter[2]  BP 118/68   Pulse 78   Temp 97.8 F (36.6 C) (Oral)   Ht 5' 9 (1.753 m)   Wt 294 lb 6 oz (  133.5 kg)   SpO2 96%   BMI 43.47 kg/m  Objective:   Physical Exam HENT:     Right Ear: Tympanic membrane and ear canal normal.     Left Ear: Tympanic membrane and ear canal normal.  Eyes:     Pupils: Pupils are equal, round, and reactive to light.  Cardiovascular:     Rate and Rhythm: Normal rate and regular rhythm.  Pulmonary:     Effort: Pulmonary effort is normal.     Breath sounds: Normal breath sounds.  Abdominal:     General: Bowel sounds are normal.     Palpations: Abdomen is soft.     Tenderness: There is no abdominal tenderness.  Musculoskeletal:        General: Normal range of motion.     Cervical back: Neck supple.  Skin:    General: Skin is warm and dry.  Neurological:     Mental Status: He is alert and oriented to person, place, and time.     Cranial Nerves: No cranial nerve deficit.     Deep Tendon Reflexes:     Reflex Scores:      Patellar reflexes are 2+ on the right side and 2+ on the left side. Psychiatric:        Mood and Affect: Mood normal.     Physical Exam        Assessment & Plan:  Preventative health care Assessment & Plan: Declines Shingrix and influena vaccines. Colonoscopy UTD, due May 2026. Referral placed.  PSA due and pending.  Discussed the importance of a healthy diet and regular exercise in order for weight loss, and to reduce the risk of further co-morbidity.  Exam stable. Labs pending.  Follow up in 1 year for repeat physical.    Essential hypertension Assessment & Plan: Controlled.  Continue spironolactone  25 mg daily and olmesartan  20 mg daily. CMP reviewed from December  2025.   OSA (obstructive sleep apnea) Assessment & Plan: Continue CPAP nightly    Gastroesophageal reflux disease, unspecified whether esophagitis present Assessment & Plan: Controlled.  We discussed weaning off PPI with H2 blocker and then eventually off.    Acute bilateral thoracic back pain Assessment & Plan: Continued.  Offered further treatment for which he kindly declines.     Screening for colon cancer -     Ambulatory referral to Gastroenterology  Type 2 diabetes mellitus with hyperglycemia, without long-term current use of insulin  (HCC) Assessment & Plan: Deteriorated with A1C of 7.2 from employers labs he brings today. He will attach labs.  Declines treatment for diabetes. Urine microalbumin pending.  Declines pneumonia vaccine.  Follow up in 6 months.  Orders: -     Microalbumin / creatinine urine ratio  Hyperlipidemia, unspecified hyperlipidemia type Assessment & Plan: Slightly improved based on labs he brings from employer.  He declines cholesterol treatment.  Reviewed coronary calcium  score from 2024.  Encouraged exercise.   Screening for prostate cancer -     PSA    Assessment and Plan Assessment & Plan         Comer MARLA Gaskins, NP       [1]  Allergies Allergen Reactions   Nuvigil [Armodafinil] Other (See Comments)    Seizures    Seroquel [Quetiapine] Other (See Comments)    Seizures.   Simvastatin Hives  [2]  Current Outpatient Medications on File Prior to Visit  Medication Sig Dispense Refill   furosemide  (LASIX ) 20 MG tablet Take 1 tablet (20mg ) AS  NEEDED for wt gain of 3lbs overnight or 5 lbs in one week. 30 tablet 0   methocarbamol  (ROBAXIN ) 500 MG tablet Take 1 tablet (500 mg total) by mouth every 8 (eight) hours as needed for muscle spasms. 15 tablet 0   olmesartan  (BENICAR ) 20 MG tablet TAKE 1 TABLET BY MOUTH EVERY DAY FOR BLOOD PRESSURE 90 tablet 0   omeprazole  (PRILOSEC) 20 MG capsule Take 1 capsule (20 mg  total) by mouth daily. 60 capsule 3   spironolactone  (ALDACTONE ) 25 MG tablet TAKE 1 TABLET BY MOUTH EVERY DAY FOR BLOOD PRESSURE 90 tablet 0   No current facility-administered medications on file prior to visit.   "

## 2025-01-04 NOTE — Patient Instructions (Signed)
 Stop by the lab prior to leaving today. I will notify you of your results once received.   Continue exercising. You should be getting 150 minutes of moderate intensity exercise weekly.  Please schedule a follow up visit for 6 months for a diabetes check.  It was a pleasure to see you today!

## 2025-01-04 NOTE — Assessment & Plan Note (Signed)
 Controlled.  We discussed weaning off PPI with H2 blocker and then eventually off.

## 2025-01-04 NOTE — Assessment & Plan Note (Signed)
 Declines Shingrix and influena vaccines. Colonoscopy UTD, due May 2026. Referral placed.  PSA due and pending.  Discussed the importance of a healthy diet and regular exercise in order for weight loss, and to reduce the risk of further co-morbidity.  Exam stable. Labs pending.  Follow up in 1 year for repeat physical.

## 2025-01-04 NOTE — Assessment & Plan Note (Signed)
 Deteriorated with A1C of 7.2 from employers labs he brings today. He will attach labs.  Declines treatment for diabetes. Urine microalbumin pending.  Declines pneumonia vaccine.  Follow up in 6 months.

## 2025-01-04 NOTE — Assessment & Plan Note (Addendum)
 Controlled.  Continue spironolactone  25 mg daily and olmesartan  20 mg daily. CMP reviewed from December 2025.

## 2025-01-04 NOTE — Assessment & Plan Note (Signed)
 Continued.  Offered further treatment for which he kindly declines.

## 2025-01-04 NOTE — Assessment & Plan Note (Signed)
"  Continue CPAP nightly         "

## 2025-01-04 NOTE — Assessment & Plan Note (Signed)
 Slightly improved based on labs he brings from employer.  He declines cholesterol treatment.  Reviewed coronary calcium  score from 2024.  Encouraged exercise.

## 2025-01-05 NOTE — Telephone Encounter (Signed)
 Can we get these labs abstracted?

## 2025-01-05 NOTE — Telephone Encounter (Signed)
Labs abstracted as requested.

## 2025-01-06 ENCOUNTER — Other Ambulatory Visit: Payer: Self-pay | Admitting: Primary Care

## 2025-01-06 DIAGNOSIS — I10 Essential (primary) hypertension: Secondary | ICD-10-CM

## 2025-01-18 ENCOUNTER — Ambulatory Visit: Admitting: Internal Medicine

## 2025-01-18 ENCOUNTER — Encounter: Payer: Self-pay | Admitting: Internal Medicine

## 2025-01-18 VITALS — BP 120/80 | HR 68 | Temp 98.6°F | Ht 69.0 in | Wt 296.2 lb

## 2025-01-18 DIAGNOSIS — G4733 Obstructive sleep apnea (adult) (pediatric): Secondary | ICD-10-CM

## 2025-01-18 DIAGNOSIS — E669 Obesity, unspecified: Secondary | ICD-10-CM

## 2025-01-18 DIAGNOSIS — Z6841 Body Mass Index (BMI) 40.0 and over, adult: Secondary | ICD-10-CM

## 2025-01-18 NOTE — Progress Notes (Signed)
 "  Name: Evan [name] MRN: [id number] DOB: 10-01-1966     CONSULTATION DATE: 01/18/2025 REFERRING MD : gretta  STUDIES:  11/06/20 CXR independently reviewed by Me today  ECHO 2019 Grade 1 Diastolic dysfunction  July 2022 Patient diagnosed with severe sleep apnea AHI of 84 Initiate CPAP therapy HST showed AHI of 84   CHIEF COMPLAINT:  Follow-up assessment for severe sleep apnea   HISTORY OF PRESENT ILLNESS: Patient with diagnosis of severe sleep apnea  Discussed sleep data and reviewed with patient.  Encouraged proper weight management.  Discussed sleep hygiene Patient uses and benefits from therapy Using CPAP nightly and with naps Settings are comfortable and is sleeping well.  Excellent compliance report Well-controlled OSA Auto CPAP 5-12 AHI reduced 0.9 More energy less fatigue Patient feels so much better since starting CPAP therapy   No evidence of heart failure at this time No evidence or signs of infection at this time No respiratory distress No fevers, chills, nausea, vomiting, diarrhea No evidence of lower extremity edema No evidence hemoptysis  Non-smoker nonalcoholic Works for company as a HR department Currently has switched jobs to be more active Patient states he has multiple surgeries in the past including bilateral knee surgery gallbladder surgery  PAST MEDICAL HISTORY :   has a past medical history of Allergy (2010), Arthritis, Chronic cough (01/05/2020), Chronic kidney disease, Colon polyps, Diverticulitis, Elevated CK (06/14/2020), GERD (gastroesophageal reflux disease), Headache, Hyperlipidemia, Hypertension, Intractable abdominal pain (07/27/2023), Localized skin mass, lump, or swelling (09/18/2021), Lyme disease, Myalgia (11/04/2018), Neuromuscular disorder (HCC) (2014), New daily persistent headache (12/31/2021), Nocturia (02/14/2021), Seizures (HCC), Sleep apnea, and Weak urinary stream (12/31/2021).  has a past surgical history that  includes Nephrectomy (Left, 2010); Neck surgery; Shoulder surgery (Left); Hernia repair; LEFT HEART CATH AND CORONARY ANGIOGRAPHY (N/A, 05/04/2018); Prostate biopsy; Cholecystectomy (2021); Cardiac catheterization; Meniscus repair; Colonoscopy with propofol  (N/A, 04/24/2022); polypectomy (04/24/2022); Anterior cervical decomp/discectomy fusion (N/A, 11/18/2023); and Spine surgery (2012). Prior to Admission medications   Medication Sig Start Date End Date Taking? Authorizing Provider  betamethasone dipropionate (DIPROLENE) 0.05 % cream Apply 1 application topically daily.  02/16/18   [provider]  blood glucose meter kit and supplies KIT Dispense based on patient and insurance preference. Use up to four times daily as directed. (FOR ICD-9 250.00, 250.01). 02/14/21   Clark, Katherine K, NP  Cholecalciferol (VITAMIN D3) 5000 units CAPS Take 1 capsule by mouth as directed.     [provider]  dicyclomine (BENTYL) 20 MG tablet 1 tablet 09/10/20   [provider]  diphenhydrAMINE  (BENADRYL ) 25 MG tablet Take 25 mg by mouth daily.    [provider]  fluticasone  (FLONASE ) 50 MCG/ACT nasal spray Place 1 spray into both nostrils 2 (two) times daily. 01/12/20   Clark, Katherine K, NP  metFORMIN  (GLUCOPHAGE -XR) 500 MG 24 hr tablet TAKE 1 TABLET (500 MG TOTAL) BY MOUTH DAILY WITH BREAKFAST. FOR DIABETES. 03/17/21   Velma Harlene SAUNDERS, MD  metFORMIN  (GLUCOPHAGE -XR) 500 MG 24 hr tablet Take 1 tablet (500 mg total) by mouth daily with breakfast. For diabetes. 03/12/21   Clark, Katherine K, NP  mupirocin ointment (BACTROBAN) 2 % Apply 1 application topically daily. 06/07/20   [provider]  olmesartan  (BENICAR ) 20 MG tablet Take 1 tablet (20 mg total) by mouth daily. For blood pressure. 02/14/21   Clark, Katherine K, NP  omeprazole  (PRILOSEC) 20 MG capsule Take 1 capsule (20 mg total) by mouth daily. 05/04/18   Danford, Lonni SQUIBB, MD  spironolactone  (ALDACTONE ) 25 MG tablet Take  1 tablet (25 mg total) by mouth daily. For blood pressure. 02/27/21   Gretta Comer POUR, NP  Vitamin D , Ergocalciferol , (DRISDOL) 1.25 MG (50000 UNIT) CAPS capsule 1 capsule    [provider]   Allergies  Allergen Reactions   Nuvigil [Armodafinil] Other (See Comments)    Seizures    Seroquel [Quetiapine] Other (See Comments)    Seizures.   Simvastatin Hives    BP 120/80   Pulse 68   Temp 98.6 F (37 C)   Ht 5' 9 (1.753 m)   Wt 296 lb 3.2 oz (134.4 kg)   SpO2 96%   BMI 43.74 kg/m     Physical Examination:  General Appearance: No distress  EYES EOM intact.   NECK Supple, No Bruits Pulmonary normal BS,No wheezing, No Rhonchi.  Cardiac S1,S2.  No Murmurs Ext No edema Neuro No Focal Deficits ALL OTHER ROS ARE NEGATIVE     ASSESSMENT AND PLAN SYNOPSIS  59 year old pleasant white male seen today for follow-up assessment for severe sleep apnea with an AHI of 84 in the setting of morbid obesity and deconditioned state   Assessment of OSA Previous AHI 84 Continue CPAP as prescribed  Excellent compliance report Reviewed compliance report in detail with patient Patient definitely benefits the use of CPAP therapy as prescribed Using CPAP nightly and with naps Pressure setting is comfortable and is sleeping well. CPAP prescription 5-12 AHI reduced to 0.9  No evidence of acute heart failure at this time No respiratory distress No fevers, chills, nausea, vomiting, diarrhea No evidence hemoptysis  Patient Instructions Need to be using your CPAP every night, minimum of 4-6 hours a night.  Change equipment as directed. Wash your tubing with warm soap and water daily, hang to dry. Wash humidifier portion weekly. Use bottled, distilled water and change daily Be aware of reduced alertness and do not drive or operate heavy machinery if experiencing this or drowsiness.  Exercise encouraged, as tolerated. Healthy weight management discussed.  Avoid or decrease  alcohol consumption and medications that make you more sleepy, if possible. Notify if persistent daytime sleepiness occurs even with consistent use of PAP therapy.   Change CPAP supplies... Every month Mask cushions and/or nasal pillows CPAP machine filters Every 3 months Mask frame (not including the headgear) CPAP tubing Every 6 months Mask headgear Chin strap (if applicable) Humidifier water tub    Obesity -recommend significant weight loss -recommend changing diet  Deconditioned state -Recommend increased daily activity and exercise   MEDICATION ADJUSTMENTS/LABS AND TESTS ORDERED: Continue CPAP as prescribed Recommend weight loss   CURRENT MEDICATIONS REVIEWED AT LENGTH WITH PATIENT TODAY   Patient  satisfied with Plan of action and management. All questions answered   Follow up 1 year   I spent a total of 42 minutes dedicated to the care of this patient on the date of this encounter to include pre-visit review of records, face-to-face time with the patient discussing conditions above, post visit ordering of testing, clinical documentation with the electronic health record, making appropriate referrals as documented, and communicating necessary information to the patient's healthcare team.    The Patient requires high complexity decision making for assessment and support, frequent evaluation and titration of therapies, application of advanced monitoring technologies and extensive interpretation of multiple databases.  Patient satisfied with Plan of action and management. All questions answered    Nickolas Alm Cellar, M.D.  [facility]  Medical Director  [facility]        "

## 2025-01-18 NOTE — Patient Instructions (Addendum)
 Need to be using your CPAP every night, minimum of 4-6 hours a night.  Change equipment as directed. Wash your tubing with warm soap and water  daily, hang to dry. Wash humidifier portion weekly. Use bottled, distilled water  and change daily Be aware of reduced alertness and do not drive or operate heavy machinery if experiencing this or drowsiness.  Exercise encouraged, as tolerated. Healthy weight management discussed.  Avoid or decrease alcohol consumption and medications that make you more sleepy, if possible. Notify if persistent daytime sleepiness occurs even with consistent use of PAP therapy.   Change CPAP supplies... Every month Mask cushions and/or nasal pillows CPAP machine filters Every 3 months Mask frame (not including the headgear) CPAP tubing Every 6 months Mask headgear Chin strap (if applicable) Humidifier water  tub      Excellent Job A+ G[employer] STAR!!  Continue CPAP as prescribed  Patient Instructions Continue to use CPAP every night, minimum of 4-6 hours a night.  Change equipment every 30 days or as directed by DME.  Wash your tubing with warm soap and water  daily, hang to dry. Wash humidifier portion weekly. Use bottled, distilled water  and change daily   Be aware of reduced alertness and do not drive or operate heavy machinery if experiencing this or drowsiness.  Exercise encouraged, as tolerated. Encouraged proper weight management.  Important to get eight or more hours of sleep  Limiting the use of the computer and television before bedtime.  Decrease naps during the day, so night time sleep will become enhanced.  Limit caffeine, and sleep deprivation.    Avoid Allergens and Irritants Avoid secondhand smoke Avoid SICK contacts Recommend  Masking  when appropriate Recommend Keep up-to-date with vaccinations

## 2025-07-04 ENCOUNTER — Ambulatory Visit: Admitting: Primary Care
